# Patient Record
Sex: Female | Born: 1970
Health system: Southern US, Community
[De-identification: ages and names within clinical notes are randomized; demographics above are authoritative.]

## PROBLEM LIST (undated history)

## (undated) DIAGNOSIS — I2510 Atherosclerotic heart disease of native coronary artery without angina pectoris: Secondary | ICD-10-CM

## (undated) DIAGNOSIS — T7840XA Allergy, unspecified, initial encounter: Secondary | ICD-10-CM

## (undated) DIAGNOSIS — Z9889 Other specified postprocedural states: Secondary | ICD-10-CM

## (undated) DIAGNOSIS — Z8489 Family history of other specified conditions: Secondary | ICD-10-CM

## (undated) DIAGNOSIS — E785 Hyperlipidemia, unspecified: Secondary | ICD-10-CM

## (undated) DIAGNOSIS — D649 Anemia, unspecified: Secondary | ICD-10-CM

## (undated) DIAGNOSIS — N760 Acute vaginitis: Secondary | ICD-10-CM

## (undated) DIAGNOSIS — M1990 Unspecified osteoarthritis, unspecified site: Secondary | ICD-10-CM

## (undated) DIAGNOSIS — N939 Abnormal uterine and vaginal bleeding, unspecified: Secondary | ICD-10-CM

## (undated) DIAGNOSIS — I10 Essential (primary) hypertension: Secondary | ICD-10-CM

## (undated) DIAGNOSIS — F419 Anxiety disorder, unspecified: Secondary | ICD-10-CM

## (undated) DIAGNOSIS — Z973 Presence of spectacles and contact lenses: Secondary | ICD-10-CM

## (undated) DIAGNOSIS — A64 Unspecified sexually transmitted disease: Secondary | ICD-10-CM

## (undated) DIAGNOSIS — R112 Nausea with vomiting, unspecified: Secondary | ICD-10-CM

## (undated) DIAGNOSIS — B9689 Other specified bacterial agents as the cause of diseases classified elsewhere: Secondary | ICD-10-CM

## (undated) HISTORY — DX: Unspecified sexually transmitted disease: A64

## (undated) HISTORY — PX: ABDOMINAL HYSTERECTOMY: SHX81

## (undated) HISTORY — DX: Hyperlipidemia, unspecified: E78.5

## (undated) HISTORY — DX: Other specified bacterial agents as the cause of diseases classified elsewhere: B96.89

## (undated) HISTORY — DX: Anxiety disorder, unspecified: F41.9

## (undated) HISTORY — DX: Allergy, unspecified, initial encounter: T78.40XA

## (undated) HISTORY — DX: Abnormal uterine and vaginal bleeding, unspecified: N93.9

## (undated) HISTORY — PX: BREAST SURGERY: SHX581

## (undated) HISTORY — PX: JOINT REPLACEMENT: SHX530

## (undated) HISTORY — DX: Acute vaginitis: N76.0

## (undated) HISTORY — PX: BREAST CYST EXCISION: SHX579

## (undated) HISTORY — PX: FOOT SURGERY: SHX648

---

## 1988-09-09 DIAGNOSIS — A64 Unspecified sexually transmitted disease: Secondary | ICD-10-CM

## 1988-09-09 HISTORY — DX: Unspecified sexually transmitted disease: A64

## 1998-02-21 ENCOUNTER — Other Ambulatory Visit: Admission: RE | Admit: 1998-02-21 | Discharge: 1998-02-21 | Payer: Self-pay | Admitting: Obstetrics and Gynecology

## 1999-03-27 ENCOUNTER — Other Ambulatory Visit: Admission: RE | Admit: 1999-03-27 | Discharge: 1999-03-27 | Payer: Self-pay | Admitting: Obstetrics and Gynecology

## 2000-03-28 ENCOUNTER — Other Ambulatory Visit: Admission: RE | Admit: 2000-03-28 | Discharge: 2000-03-28 | Payer: Self-pay | Admitting: Obstetrics and Gynecology

## 2000-11-24 ENCOUNTER — Ambulatory Visit (HOSPITAL_COMMUNITY): Admission: RE | Admit: 2000-11-24 | Discharge: 2000-11-24 | Payer: Self-pay | Admitting: Neurosurgery

## 2000-11-24 ENCOUNTER — Encounter: Payer: Self-pay | Admitting: Neurosurgery

## 2001-01-09 ENCOUNTER — Encounter: Payer: Self-pay | Admitting: Internal Medicine

## 2001-01-09 ENCOUNTER — Ambulatory Visit (HOSPITAL_COMMUNITY): Admission: RE | Admit: 2001-01-09 | Discharge: 2001-01-09 | Payer: Self-pay | Admitting: Internal Medicine

## 2001-12-31 ENCOUNTER — Ambulatory Visit (HOSPITAL_COMMUNITY): Admission: RE | Admit: 2001-12-31 | Discharge: 2001-12-31 | Payer: Self-pay | Admitting: Family Medicine

## 2001-12-31 ENCOUNTER — Encounter: Payer: Self-pay | Admitting: Family Medicine

## 2002-02-23 ENCOUNTER — Ambulatory Visit (HOSPITAL_COMMUNITY): Admission: RE | Admit: 2002-02-23 | Discharge: 2002-02-23 | Payer: Self-pay | Admitting: Family Medicine

## 2002-02-23 ENCOUNTER — Encounter: Payer: Self-pay | Admitting: Family Medicine

## 2002-04-14 ENCOUNTER — Emergency Department (HOSPITAL_COMMUNITY): Admission: EM | Admit: 2002-04-14 | Discharge: 2002-04-14 | Payer: Self-pay | Admitting: Internal Medicine

## 2002-04-14 ENCOUNTER — Encounter: Payer: Self-pay | Admitting: Internal Medicine

## 2002-04-15 ENCOUNTER — Emergency Department (HOSPITAL_COMMUNITY): Admission: EM | Admit: 2002-04-15 | Discharge: 2002-04-15 | Payer: Self-pay | Admitting: Emergency Medicine

## 2003-05-31 ENCOUNTER — Encounter: Payer: Self-pay | Admitting: Emergency Medicine

## 2003-05-31 ENCOUNTER — Emergency Department (HOSPITAL_COMMUNITY): Admission: EM | Admit: 2003-05-31 | Discharge: 2003-05-31 | Payer: Self-pay | Admitting: Emergency Medicine

## 2003-09-10 HISTORY — PX: FRACTURE SURGERY: SHX138

## 2003-09-12 ENCOUNTER — Other Ambulatory Visit: Admission: RE | Admit: 2003-09-12 | Discharge: 2003-09-12 | Payer: Self-pay | Admitting: Dermatology

## 2004-01-24 ENCOUNTER — Encounter: Admission: RE | Admit: 2004-01-24 | Discharge: 2004-01-24 | Payer: Self-pay | Admitting: Sports Medicine

## 2004-02-14 ENCOUNTER — Ambulatory Visit (HOSPITAL_BASED_OUTPATIENT_CLINIC_OR_DEPARTMENT_OTHER): Admission: RE | Admit: 2004-02-14 | Discharge: 2004-02-14 | Payer: Self-pay | Admitting: Orthopedic Surgery

## 2004-03-19 ENCOUNTER — Encounter (HOSPITAL_COMMUNITY): Admission: RE | Admit: 2004-03-19 | Discharge: 2004-04-18 | Payer: Self-pay | Admitting: Orthopedic Surgery

## 2004-04-04 ENCOUNTER — Ambulatory Visit (HOSPITAL_COMMUNITY): Admission: RE | Admit: 2004-04-04 | Discharge: 2004-04-04 | Payer: Self-pay | Admitting: *Deleted

## 2005-02-15 ENCOUNTER — Ambulatory Visit (HOSPITAL_COMMUNITY): Admission: RE | Admit: 2005-02-15 | Discharge: 2005-02-15 | Payer: Self-pay | Admitting: Preventative Medicine

## 2006-10-21 ENCOUNTER — Ambulatory Visit: Payer: Self-pay | Admitting: Gastroenterology

## 2006-11-12 ENCOUNTER — Ambulatory Visit (HOSPITAL_COMMUNITY): Admission: RE | Admit: 2006-11-12 | Discharge: 2006-11-12 | Payer: Self-pay | Admitting: Gastroenterology

## 2006-11-12 ENCOUNTER — Encounter (INDEPENDENT_AMBULATORY_CARE_PROVIDER_SITE_OTHER): Payer: Self-pay | Admitting: Specialist

## 2006-11-12 ENCOUNTER — Ambulatory Visit: Payer: Self-pay | Admitting: Gastroenterology

## 2006-11-24 ENCOUNTER — Ambulatory Visit (HOSPITAL_COMMUNITY): Admission: RE | Admit: 2006-11-24 | Discharge: 2006-11-24 | Payer: Self-pay | Admitting: Gastroenterology

## 2006-11-24 ENCOUNTER — Encounter (INDEPENDENT_AMBULATORY_CARE_PROVIDER_SITE_OTHER): Payer: Self-pay | Admitting: *Deleted

## 2006-11-24 ENCOUNTER — Ambulatory Visit: Payer: Self-pay | Admitting: Gastroenterology

## 2007-12-21 ENCOUNTER — Ambulatory Visit (HOSPITAL_COMMUNITY): Admission: RE | Admit: 2007-12-21 | Discharge: 2007-12-21 | Payer: Self-pay | Admitting: Unknown Physician Specialty

## 2008-09-09 HISTORY — PX: ENDOMETRIAL ABLATION: SHX621

## 2008-12-02 ENCOUNTER — Ambulatory Visit (HOSPITAL_COMMUNITY): Admission: RE | Admit: 2008-12-02 | Discharge: 2008-12-02 | Payer: Self-pay | Admitting: Pediatrics

## 2009-06-12 ENCOUNTER — Encounter (HOSPITAL_COMMUNITY): Admission: RE | Admit: 2009-06-12 | Discharge: 2009-07-12 | Payer: Self-pay | Admitting: Pediatrics

## 2009-06-12 ENCOUNTER — Ambulatory Visit (HOSPITAL_COMMUNITY): Payer: Self-pay | Admitting: Pediatrics

## 2009-07-07 ENCOUNTER — Other Ambulatory Visit: Admission: RE | Admit: 2009-07-07 | Discharge: 2009-07-07 | Payer: Self-pay | Admitting: Obstetrics & Gynecology

## 2009-07-12 ENCOUNTER — Ambulatory Visit (HOSPITAL_COMMUNITY): Admission: RE | Admit: 2009-07-12 | Discharge: 2009-07-12 | Payer: Self-pay | Admitting: Obstetrics & Gynecology

## 2009-10-28 ENCOUNTER — Encounter: Admission: RE | Admit: 2009-10-28 | Discharge: 2009-10-28 | Payer: Self-pay | Admitting: Neurosurgery

## 2010-08-07 ENCOUNTER — Emergency Department (HOSPITAL_COMMUNITY)
Admission: EM | Admit: 2010-08-07 | Discharge: 2010-08-07 | Payer: Self-pay | Source: Home / Self Care | Admitting: Emergency Medicine

## 2010-08-20 ENCOUNTER — Emergency Department (HOSPITAL_COMMUNITY)
Admission: EM | Admit: 2010-08-20 | Discharge: 2010-08-20 | Payer: Self-pay | Source: Home / Self Care | Admitting: Emergency Medicine

## 2010-10-01 ENCOUNTER — Encounter: Payer: Self-pay | Admitting: Chiropractic Medicine

## 2010-12-12 LAB — COMPREHENSIVE METABOLIC PANEL
ALT: 17 U/L (ref 0–35)
AST: 17 U/L (ref 0–37)
Albumin: 4.3 g/dL (ref 3.5–5.2)
BUN: 10 mg/dL (ref 6–23)
CO2: 26 mEq/L (ref 19–32)
GFR calc Af Amer: >60 mL/min (ref >60)
GFR calc non Af Amer: >60 mL/min (ref >60)
Glucose, Bld: 104 mg/dL — ABNORMAL HIGH (ref 70–99)
Potassium: 3.5 mEq/L (ref 3.5–5.1)
Sodium: 140 mEq/L (ref 135–145)
Total Protein: 7 g/dL (ref 6.0–8.3)

## 2010-12-12 LAB — URINALYSIS, DIPSTICK ONLY
Bilirubin Urine: NEGATIVE
Glucose, UA: NEGATIVE mg/dL
Hgb urine dipstick: NEGATIVE
Specific Gravity, Urine: <1.005 — ABNORMAL LOW (ref 1.005–1.030)
pH: 6 (ref 5.0–8.0)

## 2010-12-12 LAB — CBC
MCV: 75.3 fL — ABNORMAL LOW (ref 78.0–100.0)
Platelets: 207 10*3/uL (ref 150–400)
RBC: 4.62 MIL/uL (ref 3.87–5.11)

## 2010-12-12 LAB — HCG, QUANTITATIVE, PREGNANCY: hCG, Beta Chain, Quant, S: <2 m[IU]/mL (ref <5)

## 2011-01-25 NOTE — Consult Note (Signed)
Doris Davis, Doris Davis                 ACCOUNT NO.:  [id number]   MEDICAL RECORD NO.:  [id number]           PATIENT TYPE:  AMB   LOCATION:                                FACILITY:  [facility]   PHYSICIAN:  Kassie Mends, M.D.      DATE OF BIRTH:  [birth year]   DATE OF CONSULTATION:  10/21/2006  DATE OF DISCHARGE:                                 CONSULTATION   REASON FOR CONSULTATION:  Abdominal pain.   HISTORY OF PRESENT ILLNESS:  Mrs. Doris Davis is a 40 year old female who is  admitted having abdominal pain, intermittent diarrhea and rectal  bleeding since Thanksgiving.  She says her symptoms have been getting  progressively worse.  She has been having to watch what she has been  eating.  She reports having a sensitive stomach for many years, but  things have been worse than usual.  She usually is only bothered by fat.  She can eat Slim Fast, lean meat, cheese and crab legs.  She has lost  approximately 20 pounds.  She reports having a subjective fever off and  on.  Today, she actually has a temperature of 101.2.  She has had blood  work performed but no stool studies.  Dr. Milinda Cave ordered stool studies,  but she did not want to have to repeat them, so she did not submit a  sample.  She did travel to [place] in 2006 and ate the food and drank the  water.  She has no sick contacts.  For 1-2 days during the trip to  [place], she did have an upset stomach, but she thought that was because  the food was spicy.  She did also take a trip to the [place] on a cruise  ship in 2004.  She denies any antibiotic use.  She has been exposed to  well water.  She is nauseated every day.  She has a vague sense of  nausea every day.  This has become more noticeable since Thanksgiving.  She vomits once a month.  No blood has been in the vomit.  She dry  heaves less than once a week.  Her morning bowel movement is formed, but  then her bowel movements become watery throughout the day.   During Thanksgiving, she was having  three bowel movements a day, now she  may have up to 10 bowel movements a day.  She eats and then she has to  go to the bathroom.  Now when she eats she has to run to the bathroom.  She complains of pain in her rectum as well.  She only has pain in her  rectum with bowel movements.  She denies any sores in her mouth or rash  on her shins.  She denies any joint pain.  She complains of feet pain  related to fracture in both of her ankles at the same time.  She  complains of pain with swallowing.  She has seen blood from her rectum,  sometimes like period.  The last time she saw any bleeding from her  rectum was two  weeks ago.  She denies any black, tarry stools, heartburn  or indigestion.  She has visited her mother-in-law who is in a nursing  home.  She complains of left lower quadrant pain that is sharp and achy.  It varies in intensity.  It can be so severe that it knocks her to her  knees.  It happens at least once a week.  It is off and on.  When it is  sharp, it can last 2-3 minutes.  The achiness can be more prolonged and  last up to 1-2 hours.  It usually occurs after eating.  Her pain is  associated with bloating.  The pain is usually worse after eating and  after fried foods.  It is better if she does not eat.  She did try Aleve  which did not help.  It does not radiate.  The only time she has seen  blood in her stool it is associated with the diarrhea.  She denies any  use of aspirin, BC's, Goody's or Motrin.  She stopped using Aleve during  November.  Her last menstrual period was October 06, 2006.  She  describes the pain as worse, because it is more often more frequent and  more severe.   PAST MEDICAL HISTORY:  1. Hypertension.  2. Ruptured disk.  3. Perimenstrual cramping.   PAST SURGICAL HISTORY:  1. Left ankle reconstruction.  2. C-section.   ALLERGIES:  PENICILLIN CAUSES HER TO STOP BREATHING, ERYTHROMYCIN CAUSES  HIVES, SULFA CAUSES HIVES AND CAUSES HER TO STOP  BREATHING, CPAP CAUSES  HER TO BREAK OUT.  ORAL IRON PREPARATIONS CAUSE BLOOD IN STOOL AND  DIARRHEA.   MEDICATIONS:  1. Enalapril 10 mg daily.  2. Hyoscyamine 0.15 mg sublingual as needed.  She states the      hyoscyamine causes her to feel loopy, bloated, gassy and      constipated.  3. She denies any other over-the-counter preparations.   FAMILY HISTORY:  Her father and mother have polyps.  She denies any  family history of colon cancer.  She denies any family history of  ulcerative colitis, Crohn's disease or celiac sprue.   SOCIAL HISTORY:  She is married, and her husband is in his 15's.  She  has one child who is 20 years old.  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  She quit smoking from June 2005 to September 2006.  She  drinks 3-4 Michelob Lights a week.   REVIEW OF SYSTEMS:  Per the HPI, otherwise, all systems are negative.   OBJECTIVE:  VITAL SIGNS:  Weight 199 pounds, height 5 feet 4 inches, BMI  34.2 (obese), temperature 101.2, blood pressure 138/80, pulse 88.  GENERAL:  She is in no apparent distress, alert and oriented x4.  HEENT:  Normocephalic, atraumatic.  Pupils equal, round and reactive to light.  Mouth:  No oral lesions.  Posterior pharynx without erythema or exudate.  NECK:  Full range of motion.  No lymphadenopathy. LUNGS:  Clear to  auscultation bilaterally. CARDIOVASCULAR:  Regular rate and rhythm.  No  murmur.  Normal S1, S2.  ABDOMEN:  Bowel sounds are present, soft,  nondistended, no hepatosplenomegaly.  No abdominal bruits.  Obese.  No  pulsatile masses.  Mild tenderness to palpation, to moderate tenderness  to palpation in the left lower quadrant without rebound or guarding.  EXTREMITIES:  Without cyanosis, clubbing or edema.  RECTAL:  Heme  negative, formed stool in the rectal vault without masses.  Small  external hemorrhoids. NEUROLOGICAL:  She has no focal neurological  deficits.  LABORATORY DATA:  From August 2007, white count 7.3,  hemoglobin 10.3,  MCV 76.4, platelets 302, BUN 14, creatinine 0.66, AST 23, ALT 44,  albumin 4.4, TSH 1.367.   ASSESSMENT:  Mrs. Doris Davis is a 40 year old female with abdominal pain,  intermittent formed stool, diarrhea and rectal bleeding and nausea.  She  has a history of travel to [place].  She likely has a functional gut  disorder and hemorrhoids.  The differential diagnosis includes  infectious colitis and irritable bowel disease.  She could also have  celiac sprue, as well, with hemorrhoids.   Thank you for allowing me to see Mrs. Doris Davis in consultation.  My  recommendations follow.   RECOMMENDATIONS:  1. I recommend obtaining Giardia antigen, stool ova and parasites x3      due to her travel to [place].  I will also obtain a C-diff toxin x1      and fecal WBC's.  She is asked to give me a liquid stool sample,      not a formed stool sample.  2. I will schedule a colonoscopy and EGD next Wednesday.  I will      biopsy any abnormal mucosal areas, look into the terminal ileum and      also perform an upper endoscopy and biopsy or duodenum.  She will      have the Moviprep.  3. She has a follow up appointment to see me in six weeks.  4. She should use Tylenol as needed for fever.  I will make additional      recommendations in regards to the management of her abdominal pain,      rectal bleeding and diarrhea after the endoscopy is complete.      Kassie Mends, M.D.  Electronically Signed     SM/MEDQ  D:  10/21/2006  T:  10/22/2006  Job:  [id number]   cc:   Jeoffrey Massed, MD  Fax: [phone]

## 2011-01-25 NOTE — Op Note (Signed)
NAMECANDRA, Doris Davis                 ACCOUNT NO.:  [id number]   MEDICAL RECORD NO.:  [id number]          PATIENT TYPE:  AMB   LOCATION:  DAY                           FACILITY:  [facility]   PHYSICIAN:  Kassie Mends, M.D.      DATE OF BIRTH:  08/19/1971   DATE OF PROCEDURE:  11/24/2006  DATE OF DISCHARGE:                               OPERATIVE REPORT   REFERRING PHYSICIAN:  Jeoffrey Massed, MD.   PROCEDURE:  Esophagogastroduodenoscopy with cold forceps biopsies  performed with propofol because patient failed attempt at EGD with  conscious sedation on 11/12/2006 because she was extremely agitated and  was pulling the scope out of her mouth, needed to be physically  restrained during the procedure, and requested the procedure be stopped.   INDICATION FOR EXAM:  Ms. Doris Davis is a 40 year old female with persistent  abdominal pain, intermittent vomiting, and weight loss.   FINDINGS:  1. Normal esophagus without evidence of Barrett's, erosions, erythema,      or ulceration.  2. Normal stomach without evidence of ulcerations or erosions.      Biopsies obtained to evaluate for eosinophilic gastritis.  3. Normal duodenal bulb and second portion of the duodenum.  Biopsies      obtained to evaluate for celiac sprue.   RECOMMENDATIONS:  1. Ms. Doris Davis states that most foods cause her to have increased      abdominal pain.  She continues to drink Slim-Fast.  She is only      consuming two Slim-Fasts per day.  I told her she needed to consume      at least four a day.  I encouraged her to attempt to consume a soft      diet.  She is given a handout on a soft diet.  2. Will call [name] with her biopsy results.  3. Schedule CT enterography as soon as possible to evaluate her      abdominal pain and vomiting.  4. No aspirin or anti-inflammatory drugs for 3 days.   MEDICATION:  Provided by anesthesia.   PROCEDURE TECHNIQUE:  Physical exam was performed and informed consent  was obtained from the  patient after explaining the benefits, risks and  alternatives to the procedure.  The patient was connected to the monitor  and placed in the left lateral position.  Continuous oxygen was provided  by nasal and IV medicine administered through an indwelling cannula.  After administration of sedation by anesthesia, the patient's esophagus  was intubated and the  scope was passed under direct visualization to second portion of  duodenum.  The scope was subsequently slowly by carefully examining the  color, texture, anatomy and integrity of the mucosa on the way out.  The  patient was recovered in the endoscopy suite and discharged to home in  satisfactory condition.      Kassie Mends, M.D.  Electronically Signed     SM/MEDQ  D:  11/24/2006  T:  11/24/2006  Job:  [id number]

## 2011-01-25 NOTE — Op Note (Signed)
Doris Davis, NODAL                        ACCOUNT NO.:  [id number]   MEDICAL RECORD NO.:  [id number]                   PATIENT TYPE:  AMB   LOCATION:  DSC                                  FACILITY:  [facility]   PHYSICIAN:  Leonides Grills, M.D.                  DATE OF BIRTH:  1971/03/28   DATE OF PROCEDURE:  02/14/2004  DATE OF DISCHARGE:                                 OPERATIVE REPORT   PREOPERATIVE DIAGNOSES:  1. Left peroneus brevis tear.  2. Subluxing left peroneal tendons.  3. Tenosynovitis, left peroneal tendons.   POSTOPERATIVE DIAGNOSES:  1. Left peroneus brevis tear.  2. Subluxing left peroneal tendons.  3. Tenosynovitis, left peroneal tendons.   OPERATION:  1. Left peroneal tenosynovectomy.  2. Left repair of subluxing peroneal tendons without fibular osteotomy.  3. Repair of left peroneus brevis tear.   ANESTHESIA:  General endotracheal tube with popliteal block.   SURGEON:  Leonides Grills, M.D.   ASSISTANT:  Lianne Cure, P.A.   ESTIMATED BLOOD LOSS:  Minimal.   TOURNIQUET TIME:  Approximately 1 hour.   COMPLICATIONS:  None.   DISPOSITION:  Stable to PAR.   INDICATIONS:  This is a 40 year old female who has long-standing left  posterolateral ankle pain that was interfering with her life to the point  where she could not do what she wants to do.  She was consented for the  above procedure.  All risks, which include infection, nerve or vessel  injury, persistent pain, worsening pain, stiffness, and weakness, were all  explained.  Questions were encouraged and answered.   OPERATION:  The patient was brought to the operating room and placed in the  supine position initially after adequate general endotracheal tube  anesthesia was administered with popliteal block as well as vancomycin 500  mg IV piggyback.  The patient was then placed in a sloppy lateral position  with the operative side up and the left lower extremity was then prepped and  draped in a  sterile manner over a proximally-placed thigh tourniquet.  The  limb was gravity-exsanguinated and the tourniquet was elevated to 290 mmHg.  A curvilinear incision was made over the posterior lateral aspect of the  left ankle.  Dissection was carried down through skin.  Hemostasis was  obtained.  The retinaculum over the peroneal tendons was then opened  approximately 2 mm from the posterolateral edge of the fibula.  Once this  was done, the tendons were identified.  There was a tremendous amount of  synovitis in this area.  This was completely debulked.  Also the peroneus  brevis and longus muscle bellies were distalized all the way to the tip of  the lateral malleolus.  This was also debulked proximately about 4-5 cm.  Once this was done, we inspected the tendon.  The peroneus longus was in  pristine condition.  The peroneus brevis, however, had a large tear  down the  center of the tendon measuring approximately 3-3.5 cm in length.  It was  less than 50% involvement of the tendon, so we decided to try to preserve  the tendon and repair the tear.  This was repaired with 5-0 nylon suture in  a cannulated stitch manner.  Once this was done and sewn on either side of  the tear, the area was copiously irrigated with normal saline.  We then  created a bed for the advancement of the peroneal retinaculum.  Once this  was prepared with a curved quarter-inch osteotome and a rongeur, we then  placed one 5.0 absorbable suture anchor in this area with #2 Fibrewire.  We  then advanced the retinaculum to the anchor and sewed this down.  We then  advanced the remaining portion of the retinaculum and repaired it to the  prepared bed in the posterolateral aspect of the fibula with #2 Fibrewire.  Once that was repaired, the repair was excellent and strong.  The tendons  were free distally as well in its respective peroneal sheath and tenolysed,  so there was no impinging area.  The ankle was ranged with  ankle range of  motion as well as talar motion, and the tendons were solid.  There were no  impinging areas as well.  The remaining portion of the retinaculum was  repaired with 2-0 Fibrewire, protecting the peroneal tendons with a Freer.  Again the ankle was ranged.  There were no abnormalities.  The wound was  copiously irrigated with normal saline.  The subcu was closed with 3-0  Vicryl, the skin was closed with 4-0 nylon.  Prior to wound closure the  tourniquet was deflated, hemostasis was obtained.  After the wound closure a  sterile dressing was applied, a Jones dressing was applied.  The patient was  stable to the PAR.                                               Leonides Grills, M.D.    PB/MEDQ  D:  02/14/2004  T:  02/15/2004  Job:  [id number]

## 2011-01-25 NOTE — Op Note (Signed)
Doris Davis, Doris Davis                 ACCOUNT NO.:  [id number]   MEDICAL RECORD NO.:  [id number]          PATIENT TYPE:  AMB   LOCATION:  DAY                           FACILITY:  [facility]   PHYSICIAN:  Kassie Mends, M.D.      DATE OF BIRTH:  Jan 12, 1971   DATE OF PROCEDURE:  11/12/2006  DATE OF DISCHARGE:                               OPERATIVE REPORT   REFERRING PHYSICIANS:  Jeoffrey Massed, M.D.   PROCEDURE:  1. Colonoscopy with cold-forceps biopsy.  2. Limited esophagogastroduodenoscopy secondary to patient agitation.   INDICATION FOR EXAMINATION:  Doris Davis is a 40 year old female who was  seen as an outpatient in February2008.  She was complaining of abdominal  pain, intermittent diarrhea and rectal bleeding since November2007.  Since she was seen, her last episode of rectal bleeding was 6 weeks ago.  She still complains of intermittent fevers.  She has vomiting 4 to 5  times a week.  She reports a 13-pound weight loss.  She is having 4 to 5  formed soft stools a day.  She still has the urge to have a bowel  movement and sometimes rectal urgency.  She has left lower quadrant  abdominal pain.  She still is on a Slim-Fast diet as well as low-fat  foods.  Her stool studies revealed no etiology for infectious diarrhea.   FINDINGS:  1. Normal terminal ileum.  Approximately 10 cm of the terminal ileum      visualized.  2. Rare sigmoid diverticulosis.  Biopsies obtained to evaluate for      microscopic colitis.  Otherwise normal colon without evidence of      polyps, masses, inflammatory changes or AVMs.  3. Normal retroflexed view of the rectum.  Doris Davis had significant      abdominal discomfort when the scope was passed through the sigmoid      colon.  4. The upper endoscopy had to be discontinued because Doris Davis was      extremely agitated when the scope was passed through her upper      esophagus.  She was grabbing at the scope and requested that the      scope be withdrawn.   After the scope was removed, she was      adequately sedated.  The visualization of the esophagus and the      gastric mucosa was limited.  The scope was not able to be passed      into the duodenal bulb or the second portion of the duodenum.   RECOMMENDATIONS:  1. High-fiber diet.  Doris Davis was given a handout on high-fiber diet      and diverticulosis.  2. Will call [name] with the results of her biopsies. If her upper      endoscopy is negative, then CT enterography in [place] to      complete evaluation of her GI tract and to evalaute the palpable      nodule in the anterior portion of her rectum 3.  No aspirin, NSAIDs      or anticoagulation for  7 days.  3. Will schedule upper endoscopy with propofol to evaluate the upper      GI tract in three weeks.  4. She already has a follow-up appointment to see me within the next 4      weeks.   MEDICATIONS:  1. Colonoscopy - Demerol 100 mg IV, Versed 7 mg IV, Phenergan 50 mg      IV.  2. Esophagogastroduodenoscopy - Versed 3 mg IV.   PROCEDURE TECHNIQUE:  Physical exam was performed.  Informed consent was  obtained from the patient after explaining the benefits, risks and  alternatives to the procedure.  The patient was connected to the monitor  and placed in the left lateral position.  Continuous oxygen was provided  by nasal cannula and IV medicine administered through an indwelling  cannula.  After administration of sedation and rectal exam, the  patient's rectum was intubated, and the scope was advanced under direct  visualization to the cecum.  The scope was subsequently removed slowly  by carefully examining color, texture, anatomy and integrity of mucosa  on the way out.   After the colonoscopy, the patient's esophagus was attempted to be  intubated with a diagnostic gastroscope.  The patient was extremely  agitated.  The patient was given additional sedation and the diagnostic  scope was passed into the stomach the  patient again became agitated and  requested the procedure be stopped. The scope was subsequently  withdrawn. After the scope was removed, she remained adequately sedated.  The patient was recovered in the endoscopy suite and discharged to home  in satisfactory condition      Kassie Mends, M.D.  Electronically Signed     SM/MEDQ  D:  11/12/2006  T:  11/12/2006  Job:  [id number]   cc:   Jeoffrey Massed, MD  Fax: [phone]   Kassie Mends, M.D.  [address]  [place] , [place] [address]

## 2011-05-07 ENCOUNTER — Other Ambulatory Visit: Payer: Self-pay | Admitting: Obstetrics & Gynecology

## 2011-05-07 ENCOUNTER — Other Ambulatory Visit (HOSPITAL_COMMUNITY)
Admission: RE | Admit: 2011-05-07 | Discharge: 2011-05-07 | Disposition: A | Discharge status: Home / Self Care | Payer: 59 | Source: Ambulatory Visit | Attending: Obstetrics & Gynecology | Admitting: Obstetrics & Gynecology

## 2011-05-07 DIAGNOSIS — Z01419 Encounter for gynecological examination (general) (routine) without abnormal findings: Secondary | ICD-10-CM | POA: Insufficient documentation

## 2011-07-12 ENCOUNTER — Other Ambulatory Visit: Payer: Self-pay | Admitting: Obstetrics & Gynecology

## 2011-07-12 ENCOUNTER — Encounter (HOSPITAL_COMMUNITY)
Admission: RE | Admit: 2011-07-12 | Discharge: 2011-07-12 | Disposition: A | Discharge status: Home / Self Care | Payer: 59 | Source: Ambulatory Visit | Attending: Obstetrics & Gynecology | Admitting: Obstetrics & Gynecology

## 2011-07-12 ENCOUNTER — Encounter (HOSPITAL_COMMUNITY): Payer: Self-pay | Admitting: Pharmacy Technician

## 2011-07-12 ENCOUNTER — Other Ambulatory Visit: Payer: Self-pay

## 2011-07-12 ENCOUNTER — Encounter (HOSPITAL_COMMUNITY): Payer: Self-pay

## 2011-07-12 HISTORY — DX: Nausea with vomiting, unspecified: R11.2

## 2011-07-12 HISTORY — DX: Nausea with vomiting, unspecified: Z98.890

## 2011-07-12 HISTORY — DX: Essential (primary) hypertension: I10

## 2011-07-12 LAB — URINALYSIS, ROUTINE W REFLEX MICROSCOPIC
Bilirubin Urine: NEGATIVE
Hgb urine dipstick: NEGATIVE
Nitrite: NEGATIVE
Protein, ur: NEGATIVE mg/dL
Specific Gravity, Urine: 1.01 (ref 1.005–1.030)
Urobilinogen, UA: 0.2 mg/dL (ref 0.0–1.0)

## 2011-07-12 LAB — CBC
MCH: 29.4 pg (ref 26.0–34.0)
MCHC: 33.7 g/dL (ref 30.0–36.0)
MCV: 87.4 fL (ref 78.0–100.0)
Platelets: 226 10*3/uL (ref 150–400)

## 2011-07-12 LAB — COMPREHENSIVE METABOLIC PANEL
ALT: 19 U/L (ref 0–35)
AST: 14 U/L (ref 0–37)
CO2: 28 mEq/L (ref 19–32)
Calcium: 10.1 mg/dL (ref 8.4–10.5)
Creatinine, Ser: 0.62 mg/dL (ref 0.50–1.10)
GFR calc Af Amer: >90 mL/min (ref >90)
GFR calc non Af Amer: >90 mL/min (ref >90)
Sodium: 136 mEq/L (ref 135–145)
Total Protein: 7 g/dL (ref 6.0–8.3)

## 2011-07-12 NOTE — Patient Instructions (Addendum)
18 Doris Davis  07/12/2011   Your procedure is scheduled on:  07/17/2011  Report to [facility] at 830  AM.  Call this number if you have problems the morning of surgery: [phone]   Remember:   Do not eat food:After Midnight.  Do not drink clear liquids: After Midnight.  Take these medicines the morning of surgery with A SIP OF WATER:vasotec   Do not wear jewelry, make-up or nail polish.  Do not wear lotions, powders, or perfumes. You may wear deodorant.  Do not shave 48 hours prior to surgery.  Do not bring valuables to the hospital.  Contacts, dentures or bridgework may not be worn into surgery.  Leave suitcase in the car. After surgery it may be brought to your room.  For patients admitted to the hospital, checkout time is 11:00 AM the day of discharge.   Patients discharged the day of surgery will not be allowed to drive home.  Name and phone number of your driver: none  Special Instructions: CHG Shower Use Special Wash: 1/2 bottle night before surgery and 1/2 bottle morning of surgery.   Please read over the following fact sheets that you were given: Pain Booklet, MRSA Information, Surgical Site Infection Prevention, Anesthesia Post-op Instructions and Care and Recovery After Surgery Supracervical Hysterectomy The uterus is the female organ that produces menstrual periods and carries a baby. The body of the uterus is the top part that is in the pelvis. The cervix (the opening or neck) is the bottom part that protrudes into the top of the vagina. A supracervical hysterectomy is the removal of the body of the uterus but not the cervix. This procedure can be performed with a large abdominal incision. It can also be done with a long tube with a light (laparoscopy) inserted into two small incisions in the lower abdomen. The tubes and ovaries can be removed during this operation if necessary. You will not have menstrual periods or be able to get pregnant after having this procedure.  Women  who are going to have this procedure should be tested to make sure they have a normal Pap test and no signs of precancer or cancer disease of the cervix or uterus. You should also know about the possibility of problems developing on the cervix later, which may need more surgery to remove the cervix.  Reasons this procedure is done:  Endometriosis. This is when the lining of the inside of the uterus is misplaced outside of its normal location.   Adenomyosis. This is when endometrial tissue goes in the muscle of the uterus.   Persistent abnormal bleeding.   Lasting (chronic) pelvic pain.   Uterine prolapse. This is when the uterus falls down into the vagina.  LET YOUR CAREGIVER KNOW ABOUT:  Allergies especially to any medications.   All the medications you are taking including over-the-counter medication, herbs, eye drops and creams.   Past problems with anesthesia or novocaine.   Possibility of being pregnant.   All past surgeries.   History of blood clots or bleeding problems.   Other medical problems (diabetes, kidney, heart or lung problems).  RISKS AND COMPLICATIONS  Blood clots of the leg, heart or lung.   Infection.   Severe bleeding.   Injury to other surrounding organs.   Early menopause.   Problems with the anesthesia.   More surgery later to remove the cervix if you have problems with the cervix.  BEFORE THE PROCEDURE  Do not take aspirin or  blood thinners a week before the surgery or as told by your caregiver.   Do not eat or drink anything the night before the surgery or as told by your caregiver.   Let your caregiver know if you develop a cold or any other infection.   If you are admitted the day of the surgery, show up 60 minutes before the surgery or as directed by your caregiver.  PROCEDURE  An intravenous (IV) will be placed in your arm. You will be given an anesthetic to make you sleep during the surgery. You may be given a spinal anesthesia to  numb your body from the waist down. When you wake up, you will still have the IV and also have a Foley catheter in you. A Foley catheter will drain your bladder for a day or two.  AFTER THE PROCEDURE  You will be taken to the recovery room for a couple of hours until it is safe to take you to your hospital room. Usually, you will remain in the hospital for 3 to 5 days. You may be given a medicine that kills germs (antibiotic) during and after the surgery. Pain medication will be ordered for you by your caregiver while you are in the hospital and for when you go home. HOME CARE INSTRUCTIONS  Healing will take time. You will have discomfort, tenderness, swelling and bruising at the operative site for a couple of weeks. This is normal and will get better as time goes on.   Only take over-the-counter or prescription medicines for pain, discomfort or fever as directed by your caregiver.   Do not take aspirin. It can cause bleeding.   Do not drive when taking pain medication.   Follow your caregiver's advice regarding diet, exercise, lifting, driving and general activities.   Resume your usual diet as directed and allowed.   Get plenty of rest and sleep.   Do not douche, use tampons, or have sexual intercourse until your caregiver gives you permission.   Change your bandages (dressings) as directed.   Take your temperature twice a day. Write it down.   Your caregiver may recommend showers instead of baths for a few weeks.   Do not drink alcohol until your caregiver gives you permission.   If you develop constipation, you may take a mild laxative with your caregiver's permission. Bran foods and drinking fluids helps with constipation problems.   Try to have someone home with you for a week or two to help with the household activities.   Make sure you and your family understands everything about your operation and recovery.   Do not sign any legal documents until you feel normal again.    Keep all your follow-up appointments as recommended by your caregiver.  SEEK MEDICAL CARE IF:   There is swelling, redness or increasing pain in the wound area.   Pus is coming from the wound.   You notice a bad smell from the wound or surgical dressing.   You have pain, redness and swelling from the intravenous site.   The wound is breaking open (the edges are not staying together).   You feel dizzy or feel like fainting.   You develop pain or bleeding when you urinate.   You develop diarrhea.   You develop nausea and vomiting.   You develop abnormal vaginal discharge.   You develop a rash.   You have any type of abnormal reaction or develop an allergy to your medication.   You  need stronger pain medication for your pain.  SEEK IMMEDIATE MEDICAL CARE IF:  You have an oral temperature above 102 F (38.9 C), not controlled by medicine.   You develop abdominal pain.   You develop chest pain.   You develop shortness of breath.   You pass out.   You develop pain, swelling or redness of your leg.   You develop heavy vaginal bleeding with or without blood clots.  Document Released: 02/12/2008 Document Revised: 12/21/2010 Document Reviewed: 01/12/2008 [facility] Patient Information 2012 [place], [place].PATIENT INSTRUCTIONS POST-ANESTHESIA  IMMEDIATELY FOLLOWING SURGERY:  Do not drive or operate machinery for the first twenty four hours after surgery.  Do not make any important decisions for twenty four hours after surgery or while taking narcotic pain medications or sedatives.  If you develop intractable nausea and vomiting or a severe headache please notify your doctor immediately.  FOLLOW-UP:  Please make an appointment with your surgeon as instructed. You do not need to follow up with anesthesia unless specifically instructed to do so.  WOUND CARE INSTRUCTIONS (if applicable):  Keep a dry clean dressing on the anesthesia/puncture wound site if there is drainage.   Once the wound has quit draining you may leave it open to air.  Generally you should leave the bandage intact for twenty four hours unless there is drainage.  If the epidural site drains for more than 36-48 hours please call the anesthesia department.  QUESTIONS?:  Please feel free to call your physician or the hospital operator if you have any questions, and they will be happy to assist you.     [facility] Anesthesia Department [address] [place] [phone]

## 2011-07-15 LAB — TYPE AND SCREEN
ABO/RH(D): O NEG
Antibody Screen: NEGATIVE

## 2011-07-17 ENCOUNTER — Encounter (HOSPITAL_COMMUNITY)
Admission: RE | Disposition: A | Discharge status: Home / Self Care | Payer: Self-pay | Source: Ambulatory Visit | Attending: Obstetrics & Gynecology

## 2011-07-17 ENCOUNTER — Encounter (HOSPITAL_COMMUNITY): Payer: Self-pay | Admitting: Anesthesiology

## 2011-07-17 ENCOUNTER — Other Ambulatory Visit: Payer: Self-pay | Admitting: Obstetrics & Gynecology

## 2011-07-17 ENCOUNTER — Encounter (HOSPITAL_COMMUNITY): Payer: Self-pay | Admitting: *Deleted

## 2011-07-17 ENCOUNTER — Ambulatory Visit (HOSPITAL_COMMUNITY)
Admission: RE | Admit: 2011-07-17 | Discharge: 2011-07-17 | Disposition: A | Discharge status: Home / Self Care | Payer: 59 | Source: Ambulatory Visit | Attending: Obstetrics & Gynecology | Admitting: Obstetrics & Gynecology

## 2011-07-17 DIAGNOSIS — Z90710 Acquired absence of both cervix and uterus: Secondary | ICD-10-CM

## 2011-07-17 DIAGNOSIS — Z79899 Other long term (current) drug therapy: Secondary | ICD-10-CM | POA: Insufficient documentation

## 2011-07-17 DIAGNOSIS — Z01812 Encounter for preprocedural laboratory examination: Secondary | ICD-10-CM | POA: Insufficient documentation

## 2011-07-17 DIAGNOSIS — N946 Dysmenorrhea, unspecified: Secondary | ICD-10-CM | POA: Insufficient documentation

## 2011-07-17 DIAGNOSIS — I10 Essential (primary) hypertension: Secondary | ICD-10-CM | POA: Insufficient documentation

## 2011-07-17 DIAGNOSIS — N920 Excessive and frequent menstruation with regular cycle: Secondary | ICD-10-CM | POA: Insufficient documentation

## 2011-07-17 HISTORY — PX: LAPAROSCOPIC SUPRACERVICAL HYSTERECTOMY: SHX5399

## 2011-07-17 SURGERY — HYSTERECTOMY, SUPRACERVICAL, LAPAROSCOPIC
Anesthesia: General | Site: Abdomen | Wound class: Clean

## 2011-07-17 MED ORDER — SODIUM CHLORIDE 0.9 % IR SOLN
Status: DC | PRN
  Administered 2011-07-17: 1000 mL

## 2011-07-17 MED ORDER — LIDOCAINE HCL (PF) 1 % IJ SOLN
INTRAMUSCULAR | Status: AC
  Filled 2011-07-17: qty 5

## 2011-07-17 MED ORDER — KETOROLAC TROMETHAMINE 30 MG/ML IJ SOLN
INTRAMUSCULAR | Status: AC
  Filled 2011-07-17: qty 1

## 2011-07-17 MED ORDER — CIPROFLOXACIN IN D5W 400 MG/200ML IV SOLN: 400.0000 mg | INTRAVENOUS | Status: DC

## 2011-07-17 MED ORDER — GLYCOPYRROLATE 0.2 MG/ML IJ SOLN
INTRAMUSCULAR | Status: AC
  Filled 2011-07-17: qty 2

## 2011-07-17 MED ORDER — MIDAZOLAM HCL 2 MG/2ML IJ SOLN
INTRAMUSCULAR | Status: AC
  Filled 2011-07-17: qty 2

## 2011-07-17 MED ORDER — SCOPOLAMINE 1 MG/3DAYS TD PT72
1.0000 | MEDICATED_PATCH | Freq: Once | TRANSDERMAL | Status: DC
  Administered 2011-07-17: 1.5 mg via TRANSDERMAL

## 2011-07-17 MED ORDER — KETOROLAC TROMETHAMINE 30 MG/ML IJ SOLN: 30.0000 mg | Freq: Once | INTRAMUSCULAR | Status: DC

## 2011-07-17 MED ORDER — DROPERIDOL 2.5 MG/ML IJ SOLN
INTRAMUSCULAR | Status: AC
  Filled 2011-07-17: qty 2

## 2011-07-17 MED ORDER — KETOROLAC TROMETHAMINE 30 MG/ML IJ SOLN
INTRAMUSCULAR | Status: DC | PRN
  Administered 2011-07-17: 30 mg via INTRAVENOUS

## 2011-07-17 MED ORDER — PROPOFOL 10 MG/ML IV EMUL
INTRAVENOUS | Status: DC | PRN
  Administered 2011-07-17: 150 mg via INTRAVENOUS

## 2011-07-17 MED ORDER — DEXAMETHASONE SODIUM PHOSPHATE 4 MG/ML IJ SOLN
4.0000 mg | INTRAMUSCULAR | Status: AC
  Administered 2011-07-17: 4 mg via INTRAVENOUS

## 2011-07-17 MED ORDER — PROMETHAZINE HCL 25 MG/ML IJ SOLN: 6.2500 mg | INTRAMUSCULAR | Status: DC | PRN

## 2011-07-17 MED ORDER — GLYCOPYRROLATE 0.2 MG/ML IJ SOLN
INTRAMUSCULAR | Status: AC
  Administered 2011-07-17: 0.2 mg via INTRAVENOUS
  Filled 2011-07-17: qty 1

## 2011-07-17 MED ORDER — DEXAMETHASONE SODIUM PHOSPHATE 4 MG/ML IJ SOLN
INTRAMUSCULAR | Status: AC
  Administered 2011-07-17: 4 mg via INTRAVENOUS
  Filled 2011-07-17: qty 1

## 2011-07-17 MED ORDER — OXYCODONE-ACETAMINOPHEN 7.5-500 MG PO TABS: 1.0000 | ORAL_TABLET | Freq: Four times a day (QID) | ORAL | Status: AC | PRN

## 2011-07-17 MED ORDER — ROCURONIUM BROMIDE 100 MG/10ML IV SOLN
INTRAVENOUS | Status: DC | PRN
  Administered 2011-07-17: 35 mg via INTRAVENOUS
  Administered 2011-07-17: 5 mg via INTRAVENOUS
  Administered 2011-07-17 (×2): 10 mg via INTRAVENOUS

## 2011-07-17 MED ORDER — CIPROFLOXACIN IN D5W 400 MG/200ML IV SOLN
INTRAVENOUS | Status: AC
  Administered 2011-07-17: 400 mg via INTRAVENOUS
  Filled 2011-07-17: qty 200

## 2011-07-17 MED ORDER — ONDANSETRON HCL 4 MG/2ML IJ SOLN
INTRAMUSCULAR | Status: DC | PRN
  Administered 2011-07-17: 4 mg via INTRAVENOUS

## 2011-07-17 MED ORDER — ONDANSETRON HCL 8 MG PO TABS: 8.0000 mg | ORAL_TABLET | Freq: Three times a day (TID) | ORAL | Status: AC | PRN

## 2011-07-17 MED ORDER — SUCCINYLCHOLINE CHLORIDE 20 MG/ML IJ SOLN
INTRAMUSCULAR | Status: AC
  Filled 2011-07-17: qty 1

## 2011-07-17 MED ORDER — ROCURONIUM BROMIDE 50 MG/5ML IV SOLN
INTRAVENOUS | Status: AC
  Filled 2011-07-17: qty 1

## 2011-07-17 MED ORDER — GLYCOPYRROLATE 0.2 MG/ML IJ SOLN
0.2000 mg | Freq: Once | INTRAMUSCULAR | Status: AC
  Administered 2011-07-17: 0.2 mg via INTRAVENOUS

## 2011-07-17 MED ORDER — FENTANYL CITRATE 0.05 MG/ML IJ SOLN
INTRAMUSCULAR | Status: AC
  Filled 2011-07-17: qty 2

## 2011-07-17 MED ORDER — CLINDAMYCIN PHOSPHATE 900 MG/50ML IV SOLN
900.0000 mg | INTRAVENOUS | Status: DC
  Filled 2011-07-17 (×2): qty 50

## 2011-07-17 MED ORDER — NEOSTIGMINE METHYLSULFATE 1 MG/ML IJ SOLN
INTRAMUSCULAR | Status: AC
  Filled 2011-07-17: qty 10

## 2011-07-17 MED ORDER — ONDANSETRON HCL 4 MG/2ML IJ SOLN
INTRAMUSCULAR | Status: AC
  Administered 2011-07-17: 4 mg via INTRAVENOUS
  Filled 2011-07-17: qty 2

## 2011-07-17 MED ORDER — NEOSTIGMINE METHYLSULFATE 1 MG/ML IJ SOLN
INTRAMUSCULAR | Status: DC | PRN
  Administered 2011-07-17: 4 mg via INTRAVENOUS

## 2011-07-17 MED ORDER — SODIUM CHLORIDE 0.9 % IR SOLN
Status: DC | PRN
  Administered 2011-07-17: 3000 mL

## 2011-07-17 MED ORDER — SCOPOLAMINE 1 MG/3DAYS TD PT72
MEDICATED_PATCH | TRANSDERMAL | Status: AC
  Filled 2011-07-17: qty 1

## 2011-07-17 MED ORDER — SUFENTANIL CITRATE 50 MCG/ML IV SOLN
INTRAVENOUS | Status: AC
  Filled 2011-07-17: qty 1

## 2011-07-17 MED ORDER — ONDANSETRON HCL 4 MG/2ML IJ SOLN
INTRAMUSCULAR | Status: AC
  Filled 2011-07-17: qty 4

## 2011-07-17 MED ORDER — LACTATED RINGERS IV SOLN
INTRAVENOUS | Status: DC
  Administered 2011-07-17 (×2): via INTRAVENOUS
  Administered 2011-07-17: 1000 mL via INTRAVENOUS

## 2011-07-17 MED ORDER — DROPERIDOL 2.5 MG/ML IJ SOLN
INTRAMUSCULAR | Status: DC | PRN
  Administered 2011-07-17: 0.625 mg via INTRAVENOUS

## 2011-07-17 MED ORDER — PROPOFOL 10 MG/ML IV EMUL
INTRAVENOUS | Status: AC
  Filled 2011-07-17: qty 20

## 2011-07-17 MED ORDER — GLYCOPYRROLATE 0.2 MG/ML IJ SOLN
INTRAMUSCULAR | Status: DC | PRN
  Administered 2011-07-17: .6 mg via INTRAVENOUS

## 2011-07-17 MED ORDER — ONDANSETRON HCL 4 MG/2ML IJ SOLN
4.0000 mg | Freq: Once | INTRAMUSCULAR | Status: AC
  Administered 2011-07-17: 4 mg via INTRAVENOUS

## 2011-07-17 MED ORDER — MIDAZOLAM HCL 2 MG/2ML IJ SOLN
1.0000 mg | INTRAMUSCULAR | Status: DC | PRN
  Administered 2011-07-17 (×2): 2 mg via INTRAVENOUS

## 2011-07-17 MED ORDER — KETOROLAC TROMETHAMINE 10 MG PO TABS: 10.0000 mg | ORAL_TABLET | Freq: Three times a day (TID) | ORAL | Status: AC | PRN

## 2011-07-17 MED ORDER — FENTANYL CITRATE 0.05 MG/ML IJ SOLN
25.0000 ug | INTRAMUSCULAR | Status: DC | PRN
  Administered 2011-07-17: 50 ug via INTRAVENOUS

## 2011-07-17 MED ORDER — SUFENTANIL CITRATE 50 MCG/ML IV SOLN
INTRAVENOUS | Status: DC | PRN
  Administered 2011-07-17: 5 ug via INTRAVENOUS
  Administered 2011-07-17: 20 ug via INTRAVENOUS
  Administered 2011-07-17 (×4): 10 ug via INTRAVENOUS
  Administered 2011-07-17: 5 ug via INTRAVENOUS

## 2011-07-17 MED ORDER — LIDOCAINE HCL 1 % IJ SOLN
INTRAMUSCULAR | Status: DC | PRN
  Administered 2011-07-17: 20 mg via INTRADERMAL

## 2011-07-17 SURGICAL SUPPLY — 46 items
APPLIER CLIP UNV 5X34 EPIX (ENDOMECHANICALS) ×2 IMPLANT
APR XCLPCLP 20M/L UNV 34X5 (ENDOMECHANICALS) ×1
BAG HAMPER (MISCELLANEOUS) ×2 IMPLANT
BLADE LAPAROSCOPIC MORCELL KIT (BLADE) ×2 IMPLANT
BLADE SURG SZ11 CARB STEEL (BLADE) ×2 IMPLANT
CLOTH BEACON ORANGE TIMEOUT ST (SAFETY) ×2 IMPLANT
COVER LIGHT HANDLE STERIS (MISCELLANEOUS) ×4 IMPLANT
ELECT REM PT RETURN 9FT ADLT (ELECTROSURGICAL) ×2
ELECTRODE REM PT RTRN 9FT ADLT (ELECTROSURGICAL) ×1 IMPLANT
FILTER SMOKE EVAC LAPAROSHD (FILTER) ×2 IMPLANT
FORMALIN 10 PREFIL 480ML (MISCELLANEOUS) ×2 IMPLANT
GAUZE SPONGE 4X4 16PLY XRAY LF (GAUZE/BANDAGES/DRESSINGS) IMPLANT
GLOVE BIOGEL PI IND STRL 8 (GLOVE) ×1 IMPLANT
GLOVE BIOGEL PI INDICATOR 8 (GLOVE) ×1
GLOVE ECLIPSE 6.5 STRL STRAW (GLOVE) ×1 IMPLANT
GLOVE ECLIPSE 7.0 STRL STRAW (GLOVE) ×2 IMPLANT
GLOVE ECLIPSE 8.0 STRL XLNG CF (GLOVE) ×2 IMPLANT
GLOVE EXAM NITRILE MD LF STRL (GLOVE) ×4 IMPLANT
GLOVE INDICATOR 7.0 STRL GRN (GLOVE) ×6 IMPLANT
GLOVE INDICATOR 7.5 STRL GRN (GLOVE) ×1 IMPLANT
GLOVE SS BIOGEL STRL SZ 6.5 (GLOVE) ×2 IMPLANT
GLOVE SUPERSENSE BIOGEL SZ 6.5 (GLOVE) ×2
GOWN STRL REIN 3XL LVL4 (GOWN DISPOSABLE) IMPLANT
GOWN STRL REIN XL XLG (GOWN DISPOSABLE) ×6 IMPLANT
INST SET LAPROSCOPIC GYN AP (KITS) ×2 IMPLANT
IV NS IRRIG 3000ML ARTHROMATIC (IV SOLUTION) ×2 IMPLANT
KIT ROOM TURNOVER APOR (KITS) ×2 IMPLANT
MANIFOLD NEPTUNE II (INSTRUMENTS) ×2 IMPLANT
NDL INSUFFLATION 14GA 120MM (NEEDLE) ×1 IMPLANT
NEEDLE INSUFFLATION 14GA 120MM (NEEDLE) ×2 IMPLANT
PACK PERI GYN (CUSTOM PROCEDURE TRAY) ×2 IMPLANT
PAD ARMBOARD 7.5X6 YLW CONV (MISCELLANEOUS) ×2 IMPLANT
SCALPEL HARMONIC ACE (MISCELLANEOUS) ×2 IMPLANT
SET BASIN LINEN APH (SET/KITS/TRAYS/PACK) ×2 IMPLANT
SET TUBE IRRIG SUCTION NO TIP (IRRIGATION / IRRIGATOR) ×2 IMPLANT
SOLUTION ANTI FOG 6CC (MISCELLANEOUS) ×2 IMPLANT
SPONGE GAUZE 2X2 8PLY STRL LF (GAUZE/BANDAGES/DRESSINGS) ×6 IMPLANT
STAPLER VISISTAT 35W (STAPLE) ×2 IMPLANT
SUT VICRYL 0 UR6 27IN ABS (SUTURE) ×3 IMPLANT
TAPE CLOTH SURG 4X10 WHT LF (GAUZE/BANDAGES/DRESSINGS) ×3 IMPLANT
TRAY FOLEY CATH 14FR (SET/KITS/TRAYS/PACK) ×2 IMPLANT
TROCAR Z-THAD FIOS HNDL 12X100 (TROCAR) ×2 IMPLANT
TROCAR Z-THRD FIOS HNDL 11X100 (TROCAR) ×2 IMPLANT
TROCAR Z-THREAD SLEEVE 11X100 (TROCAR) IMPLANT
TUBING HI FLO HEAT INSUFFLATOR (IRRIGATION / IRRIGATOR) ×2 IMPLANT
WARMER LAPAROSCOPE (MISCELLANEOUS) ×2 IMPLANT

## 2011-07-17 NOTE — Op Note (Addendum)
Preoperative diagnosis:  1.  Menometrorrhagia                                         2.  Dysmenorrhea                                         3.  Status post ablation 2010                                          Postoperative diagnoses:  Same as above +  Adhesions of uterus to abdominal wall  Procedure:  Laparoscopic supracervical hysterectomy  Surgeon:  Lazaro Arms, MD  Assistant:     Anesthesia:  Gen. Endotracheal  Findings: Patient had uncomplicated ablation in 2010 and had resolution of menses for 6 months or so.  Then had return of periods, heavier and more painful.  Sonogram normal.  Responded to Megestrol but has negative side effects for patient.  Description of operation:  The patient was taken to the operating room and placed in the supine position where she underwent general endotracheal anesthesia. The patient was then placed in the low lithotomy position and was prepped and draped in the usual sterile fashion. A Foley catheter was placed. An incision was made in the umbilicus.  A Veres needle was used and peritoneal insufflation was performed.  An 11 mm non-bladed trocar was placed into the peritoneal cavity with one pass under direct visualization without difficulty.  Incisions were then made in the right and left lower quadrant and 11 mm trochars were placed again under direct visualization without difficulty.  The right adnexa was grasped and the harmonic scalpel was used to coagulate and transect the right round ligament. The right utero ovarian  ligament was also coagulated and transected with good hemostasis. The uterine vessels were skeletonized and the peritoneum was reflected off the lower uterine segment.  The left uterine cornu was then grasped and the left round ligament was coagulated and transected with the harmonic scalpel the uterine ovarian ligament was then transected and coagulated. The left uterine vessels were skeletonized the peritoneal reflection was also  taken down off the lower uterine segment without difficulty.  Hemoclips were then placed just below and above  the level of the internal os bilaterally to prevent backbleeding. The uterine vessels were then coagulated and transected bilaterally using the harmonic scalpel. There was good hemostasis. 2 pedicles were then taken down the cardinal ligament medial to the transected uterine vessel pedicle and the cervix was transected at this level using the harmonic scalpel on low setting.  All pedicles were hemostatic and the specimen was freely transected from the cervix.  The uterine morcellator was then placed in the left lower quadrant and the uterus and portion of cervix were removed in strips using the morcellator. This was done safely under direct visualization.  The pelvis was irrigated and all pedicles found to be hemostatic. Additional fluid was left inside the peritoneum to prevent her diminish future adhesion formation. The ovaries were normal and, as per preoperative plan, left in place.  All 3 incisions were closed.  First the  peritoneum and fascia closed with single sutures using 0 Vicryl.  Subcutaneous tissue was also closed using 0 Vicryl and the skin was closed using skin staples. The patient tolerated the procedure well she experienced approximately 150 cc of blood loss. She received Cipro 400 and Cleocin 900 and 30 mg of Toradol preoperatively prophylactically.  She was awakened from anesthesia taken to the recovery room in good stable condition with all counts being CORRECT x3.  EURE,LUTHER H 07/17/2011, 1:19 PM

## 2011-07-17 NOTE — Anesthesia Postprocedure Evaluation (Signed)
  Anesthesia Post-op Note  Patient: Doris Davis  Procedure(s) Performed:  LAPAROSCOPIC SUPRACERVICAL HYSTERECTOMY  Patient Location: PACU  Anesthesia Type: General  Level of Consciousness: awake, alert  and oriented  Airway and Oxygen Therapy: Patient Spontanous Breathing and Patient connected to nasal cannula oxygen  Post-op Pain: none  Post-op Assessment: Post-op Vital signs reviewed, Patient's Cardiovascular Status Stable and Respiratory Function Stable  Post-op Vital Signs: Reviewed and stable  Complications: No apparent anesthesia complications

## 2011-07-17 NOTE — Progress Notes (Signed)
Pt ambulated to BR. Was unable to void. States she doesn't feel like she needs to void. Instructed pt to call [phone] and ask for Dr on call for Dr Despina Hidden if unable to void after being home and drinking more fluids. States she wants to go home now.

## 2011-07-17 NOTE — Progress Notes (Signed)
Documented in error wrong chart

## 2011-07-17 NOTE — Anesthesia Procedure Notes (Signed)
Procedure Name: Intubation Date/Time: 07/17/2011 11:06 AM Performed by: Minerva Areola Pre-anesthesia Checklist: Patient identified, Patient being monitored, Timeout performed, Emergency Drugs available and Suction available Patient Re-evaluated:Patient Re-evaluated prior to inductionOxygen Delivery Method: Circle System Utilized Preoxygenation: Pre-oxygenation with 100% oxygen Intubation Type: IV induction Ventilation: Mask ventilation without difficulty Laryngoscope Size: Miller and 2 Grade View: Grade I Tube type: Oral Tube size: 7.0 mm Number of attempts: 1 Airway Equipment and Method: stylet Placement Confirmation: ETT inserted through vocal cords under direct vision,  positive ETCO2 and breath sounds checked- equal and bilateral Secured at: 21 cm Tube secured with: Tape Dental Injury: Teeth and Oropharynx as per pre-operative assessment

## 2011-07-17 NOTE — Interval H&P Note (Signed)
History and Physical Interval Note:   07/17/2011   10:29 AM   Doris Davis  has presented today for surgery, with the diagnosis of menometrorrhagia dysmenorrhea  The various methods of treatment have been discussed with the patient and family. After consideration of risks, benefits and other options for treatment, the patient has consented to  Procedure(s): LAPAROSCOPIC SUPRACERVICAL HYSTERECTOMY as a surgical intervention .  The patients' history has been reviewed, patient examined, no change in status, stable for surgery.  I have reviewed the patients' chart and labs.  Questions were answered to the patient's satisfaction.     Lazaro Arms  MD

## 2011-07-17 NOTE — Progress Notes (Signed)
Documented by error wrong chart

## 2011-07-17 NOTE — H&P (Signed)
Doris Davis is an 40 y.o. female. G2 P2 status post and endometrial ablation in 2010 which went well without incident. Patient responded positively for the first 4 months or so and then began having periods again, which have gotten progressively heavier and more painful.  We used megace for several months which patient responds to but makes her feel bad and gain weight.  Repeat sonogram is normal.  After discussing our conservative options patient and i have decided to proceed with laparoscopic supracervical hysterectomy.    Past Medical History  Diagnosis Date  . Hypertension   . PONV (postoperative nausea and vomiting)     sts with ablation she was given meds for nausea and "got sicker".  . Asthma     with contact with cats    Past Surgical History  Procedure Date  . Fracture surgery 2005    left ankle-[place]  . Endometrial ablation 2010    [facility]  . Cesarean section 1995    Family History  Problem Relation Age of Onset  . Anesthesia problems Neg Hx   . Malignant hyperthermia Neg Hx   . Hypotension Neg Hx   . Pseudochol deficiency Neg Hx   . Coronary artery disease Mother     Social History:  reports that she has been smoking Cigarettes.  She has a 10 pack-year smoking history. She does not have any smokeless tobacco history on file. She reports that she drinks alcohol. She reports that she does not use illicit drugs.  Allergies:  Allergies  Allergen Reactions  . Coconut Oil Anaphylaxis  . Penicillins Anaphylaxis    Prescriptions prior to admission  Medication Sig Dispense Refill  . enalapril (VASOTEC) 10 MG tablet Take 10 mg by mouth daily.        . Probiotic Product (ALIGN PO) Take 1 tablet by mouth every morning. OTC med to help w/ IBS       . valACYclovir (VALTREX) 500 MG tablet Take 500 mg by mouth 2 (two) times daily. hives         ROS  Review of Systems  Constitutional: Negative for fever, chills, weight loss, malaise/fatigue and diaphoresis.  HENT:  Negative for hearing loss, ear pain, nosebleeds, congestion, sore throat, neck pain, tinnitus and ear discharge.   Eyes: Negative for blurred vision, double vision, photophobia, pain, discharge and redness.  Respiratory: Negative for cough, hemoptysis, sputum production, shortness of breath, wheezing and stridor.   Cardiovascular: Negative for chest pain, palpitations, orthopnea, claudication, leg swelling and PND.  Gastrointestinal:Negative for abdominal pain. Negative for heartburn, nausea, vomiting, diarrhea, constipation, blood in stool and melena.  Genitourinary: Negative for dysuria, urgency, frequency, hematuria and flank pain.  Musculoskeletal: Negative for myalgias, back pain, joint pain and falls.  Skin: Negative for itching and rash.  Neurological: Negative for dizziness, tingling, tremors, sensory change, speech change, focal weakness, seizures, loss of consciousness, weakness and headaches.  Endo/Heme/Allergies: Negative for environmental allergies and polydipsia. Does not bruise/bleed easily.  Psychiatric/Behavioral: Negative for depression, suicidal ideas, hallucinations, memory loss and substance abuse. The patient is not nervous/anxious and does not have insomnia.      Blood pressure 127/91, pulse 84, temperature 98.4 F (36.9 C), temperature source Oral, resp. rate 16, last menstrual period 07/10/2011, SpO2 98.00%. Physical Exam Physical Exam  Vitals reviewed. Constitutional: She is oriented to person, place, and time. She appears well-developed and well-nourished.  HENT:  Head: Normocephalic and atraumatic.  Right Ear: External ear normal.  Left Ear: External ear normal.  Nose: Nose normal.  Mouth/Throat: Oropharynx is clear and moist.  Eyes: Conjunctivae and EOM are normal. Pupils are equal, round, and reactive to light. Right eye exhibits no discharge. Left eye exhibits no discharge. No scleral icterus.  Neck: Normal range of motion. Neck supple. No tracheal deviation  present. No thyromegaly present.  Cardiovascular: Normal rate, regular rhythm, normal heart sounds and intact distal pulses.  Exam reveals no gallop and no friction rub.   No murmur heard. Respiratory: Effort normal and breath sounds normal. No respiratory distress. She has no wheezes. She has no rales. She exhibits no tenderness.  GI: Soft. Bowel sounds are normal. She exhibits no distension and no mass. There is no tenderness. There is no rebound and no guarding.  Genitourinary:       Vulva is normal without lesions Vagina is pink moist without discharge Cervix normal in appearance and pap is normal Uterus is normal size shape and contour. Adnexa is negative with normal sized ovaries by sonogram  Musculoskeletal: Normal range of motion. She exhibits no edema and no tenderness.  Neurological: She is alert and oriented to person, place, and time. She has normal reflexes. She displays normal reflexes. No cranial nerve deficit. She exhibits normal muscle tone. Coordination normal.  Skin: Skin is warm and dry. No rash noted. No erythema. No pallor.  Psychiatric: She has a normal mood and affect. Her behavior is normal. Judgment and thought content normal.   Recent Results (from the past 336 hour(s))  SURGICAL PCR SCREEN   Collection Time   07/12/11  3:30 PM      Component Value Range   MRSA, PCR NEGATIVE  NEGATIVE    Staphylococcus aureus NEGATIVE  NEGATIVE   CBC   Collection Time   07/12/11  3:30 PM      Component Value Range   WBC 10.1  4.0 - 10.5 (K/uL)   RBC 4.35  3.87 - 5.11 (MIL/uL)   Hemoglobin 12.8  12.0 - 15.0 (g/dL)   HCT 16.1  09.6 - 04.5 (%)   MCV 87.4  78.0 - 100.0 (fL)   MCH 29.4  26.0 - 34.0 (pg)   MCHC 33.7  30.0 - 36.0 (g/dL)   RDW 40.9  81.1 - 91.4 (%)   Platelets 226  150 - 400 (K/uL)  COMPREHENSIVE METABOLIC PANEL   Collection Time   07/12/11  3:30 PM      Component Value Range   Sodium 136  135 - 145 (mEq/L)   Potassium 3.9  3.5 - 5.1 (mEq/L)   Chloride 101   96 - 112 (mEq/L)   CO2 28  19 - 32 (mEq/L)   Glucose, Bld 78  70 - 99 (mg/dL)   BUN 8  6 - 23 (mg/dL)   Creatinine, Ser 7.82  0.50 - 1.10 (mg/dL)   Calcium 95.6  8.4 - 10.5 (mg/dL)   Total Protein 7.0  6.0 - 8.3 (g/dL)   Albumin 4.1  3.5 - 5.2 (g/dL)   AST 14  0 - 37 (U/L)   ALT 19  0 - 35 (U/L)   Alkaline Phosphatase 82  39 - 117 (U/L)   Total Bilirubin 0.3  0.3 - 1.2 (mg/dL)   GFR calc non Af Amer >90  >90 (mL/min)   GFR calc Af Amer >90  >90 (mL/min)  HCG, QUANTITATIVE, PREGNANCY   Collection Time   07/12/11  3:30 PM      Component Value Range   hCG, Beta Chain, Quant, S <1  <  5 (mIU/mL)  TYPE AND SCREEN   Collection Time   07/12/11  3:30 PM      Component Value Range   ABO/RH(D) O NEG     Antibody Screen NEG     Sample Expiration 07/26/2011    URINALYSIS, ROUTINE W REFLEX MICROSCOPIC   Collection Time   07/12/11  3:30 PM      Component Value Range   Color, Urine YELLOW  YELLOW    Appearance CLEAR  CLEAR    Specific Gravity, Urine 1.010  1.005 - 1.030    pH 6.5  5.0 - 8.0    Glucose, UA NEGATIVE  NEGATIVE (mg/dL)   Hgb urine dipstick NEGATIVE  NEGATIVE    Bilirubin Urine NEGATIVE  NEGATIVE    Ketones, ur NEGATIVE  NEGATIVE (mg/dL)   Protein, ur NEGATIVE  NEGATIVE (mg/dL)   Urobilinogen, UA 0.2  0.0 - 1.0 (mg/dL)   Nitrite NEGATIVE  NEGATIVE    Leukocytes, UA NEGATIVE  NEGATIVE          Assessment/Plan: 1.  Menometrorrhagia 2.  Dysmenorrhea 3.  Status post endometrial ablation  Lpaparoscopic supracervical hysterectomy.  Pt understands the risks of surgery including but not limited t  excessive bleeding requiring transfusion or reoperation, post-operative infection requiring prolonged hospitalization or re-hospitalization and antibiotic therapy, and damage to other organs including bladder, bowel, ureters and major vessels.  The patient also understands the alternative treatment options which were discussed in full.  All questions were answered.   EURE,LUTHER  H 07/17/2011, 10:23 AM

## 2011-07-17 NOTE — Progress Notes (Signed)
Documented in error wrong chart 

## 2011-07-17 NOTE — Anesthesia Preprocedure Evaluation (Addendum)
Anesthesia Evaluation  Patient identified by MRN, date of birth, ID band Patient awake    Reviewed: Allergy & Precautions, H&P , NPO status , Patient's Chart, lab work & pertinent test results  History of Anesthesia Complications (+) PONV  Airway Mallampati: II TM Distance: >3 FB Neck ROM: Full    Dental  (+) Teeth Intact   Pulmonary Current Smoker,    Pulmonary exam normal       Cardiovascular hypertension, Pt. on medications Regular Normal    Neuro/Psych    GI/Hepatic   Endo/Other  Thyroid nodules - stable  Renal/GU      Musculoskeletal   Abdominal   Peds  Hematology   Anesthesia Other Findings   Reproductive/Obstetrics                          Anesthesia Physical Anesthesia Plan  ASA: II  Anesthesia Plan: General   Post-op Pain Management:    Induction: Intravenous  Airway Management Planned: Oral ETT  Additional Equipment:   Intra-op Plan:   Post-operative Plan: Extubation in OR  Informed Consent: I have reviewed the patients History and Physical, chart, labs and discussed the procedure including the risks, benefits and alternatives for the proposed anesthesia with the patient or authorized representative who has indicated his/her understanding and acceptance.     Plan Discussed with: CRNA  Anesthesia Plan Comments: (Hx PONV)        Anesthesia Quick Evaluation

## 2011-07-17 NOTE — Progress Notes (Signed)
Documented by error wrong chart 

## 2011-07-17 NOTE — Transfer of Care (Signed)
Immediate Anesthesia Transfer of Care Note  Patient: Doris Davis  Procedure(s) Performed:  LAPAROSCOPIC SUPRACERVICAL HYSTERECTOMY  Patient Location: PACU  Anesthesia Type: General  Level of Consciousness: awake, alert  and oriented  Airway & Oxygen Therapy: Patient Spontanous Breathing and Patient connected to nasal cannula oxygen  Post-op Assessment: Report given to PACU RN and Post -op Vital signs reviewed and stable  Post vital signs: Reviewed and stable  Complications: No apparent anesthesia complications

## 2011-07-23 ENCOUNTER — Encounter (HOSPITAL_COMMUNITY): Payer: Self-pay | Admitting: Obstetrics & Gynecology

## 2012-06-10 ENCOUNTER — Ambulatory Visit: Payer: 59 | Admitting: Orthopedic Surgery

## 2012-12-08 ENCOUNTER — Encounter: Payer: Self-pay | Admitting: *Deleted

## 2012-12-09 ENCOUNTER — Encounter: Payer: Self-pay | Admitting: Advanced Practice Midwife

## 2012-12-09 ENCOUNTER — Ambulatory Visit (INDEPENDENT_AMBULATORY_CARE_PROVIDER_SITE_OTHER): Payer: BC Managed Care – PPO | Admitting: Advanced Practice Midwife

## 2012-12-09 VITALS — BP 120/90 | Wt 170.5 lb

## 2012-12-09 DIAGNOSIS — N764 Abscess of vulva: Secondary | ICD-10-CM

## 2012-12-09 NOTE — Progress Notes (Signed)
Chief Complaint:  ?cyst/ingrown hair around groin area   Doris Davis is  42 y.o. G2P0010.  Patient's last menstrual period was 07/05/2011..  She presents complaining of ?cyst/ingrown hair around groin area . Onset is described as gradual and has been present for  10 days. On L labia minora, was much bigger, now smaller, but still sore Has a Friend With Benefits :).  Safer sex practices discussed     Past Medical History  Diagnosis Date  . Hypertension   . PONV (postoperative nausea and vomiting)     sts with ablation she was given meds for nausea and "got sicker".  . Asthma     with contact with cats  . Venereal disease 1990    chlamydia  . Allergy     Past Surgical History  Procedure Laterality Date  . Fracture surgery  2005    left ankle-[place]  . Endometrial ablation  2010    [facility]  . Cesarean section  1995  . Laparoscopic supracervical hysterectomy  07/17/2011    Procedure: LAPAROSCOPIC SUPRACERVICAL HYSTERECTOMY;  Surgeon: Lazaro Arms, MD;  Location: [facility];  Service: Gynecology;  Laterality: N/A;  . Bunionectomy    . Abdominal hysterectomy      Family History  Problem Relation Age of Onset  . Anesthesia problems Neg Hx   . Malignant hyperthermia Neg Hx   . Hypotension Neg Hx   . Pseudochol deficiency Neg Hx   . Coronary artery disease Mother   . Diabetes Other   . Hypertension Other   . Stroke Other     History  Substance Use Topics  . Smoking status: Current Every Day Smoker -- 0.50 packs/day for 20 years    Types: Cigarettes  . Smokeless tobacco: Not on file  . Alcohol Use: Yes     Comment: weekends    Allergies:  Allergies  Allergen Reactions  . Coconut Oil Anaphylaxis  . Penicillins Anaphylaxis       Review of Systems  Review of Systems  Constitutional: Negative for fever, chills, weight loss, malaise/fatigue and diaphoresis.  HENT: Negative for hearing loss, ear pain, nosebleeds, congestion, sore throat,  neck pain, tinnitus and ear discharge.   Eyes: Negative for blurred vision, double vision, photophobia, pain, discharge and redness.  Respiratory: Negative for cough, hemoptysis, sputum production, shortness of breath, wheezing and stridor.   Cardiovascular: Negative for chest pain, palpitations, orthopnea,  leg swelling  Gastrointestinal: Negative for heartburn, nausea, vomiting, diarrhea, constipation, blood in stool Genitourinary: Negative for dysuria, urgency, frequency, hematuria and flank pain.  Musculoskeletal: Negative for myalgias, back pain, joint pain and falls.  Skin: Negative for itching and rash.  Neurological: Negative for dizziness, tingling, tremors, sensory change, speech change, focal weakness, seizures, loss of consciousness, weakness and headaches.  Endo/Heme/Allergies: Negative for environmental allergies and polydipsia. Does not bruise/bleed easily.  Psychiatric/Behavioral: Negative for depression, suicidal ideas, hallucinations, memory loss and substance abuse. The patient is not nervous/anxious and does not have insomnia.      Physical Exam   Blood pressure 120/90, weight 170 lb 8 oz (77.338 kg), last menstrual period 07/05/2011.  General: General appearance - alert, well appearing, and in no distress Pelvic -Tiny furuncle L labia minora--not infected.  Squeezed with fair results  Labs: No results found for this or any previous visit (from the past 24 hour(s)). Imaging Studies:  No results  found.   Assessment: Furuncle, L labia  Plan: Soak in warm water and try to evacuate, or can leave alone, as it should reabsorb  CRESENZO-DISHMAN,Tyesha Joffe

## 2013-03-06 ENCOUNTER — Emergency Department (HOSPITAL_COMMUNITY): Payer: BC Managed Care – PPO

## 2013-03-06 ENCOUNTER — Emergency Department (HOSPITAL_COMMUNITY)
Admission: EM | Admit: 2013-03-06 | Discharge: 2013-03-06 | Disposition: A | Discharge status: Home / Self Care | Payer: BC Managed Care – PPO | Attending: Emergency Medicine | Admitting: Emergency Medicine

## 2013-03-06 ENCOUNTER — Encounter (HOSPITAL_COMMUNITY): Payer: Self-pay | Admitting: *Deleted

## 2013-03-06 DIAGNOSIS — F17200 Nicotine dependence, unspecified, uncomplicated: Secondary | ICD-10-CM | POA: Insufficient documentation

## 2013-03-06 DIAGNOSIS — Z9889 Other specified postprocedural states: Secondary | ICD-10-CM | POA: Insufficient documentation

## 2013-03-06 DIAGNOSIS — I10 Essential (primary) hypertension: Secondary | ICD-10-CM | POA: Insufficient documentation

## 2013-03-06 DIAGNOSIS — J45909 Unspecified asthma, uncomplicated: Secondary | ICD-10-CM | POA: Insufficient documentation

## 2013-03-06 DIAGNOSIS — R29898 Other symptoms and signs involving the musculoskeletal system: Secondary | ICD-10-CM | POA: Insufficient documentation

## 2013-03-06 DIAGNOSIS — Z880 Allergy status to penicillin: Secondary | ICD-10-CM | POA: Insufficient documentation

## 2013-03-06 DIAGNOSIS — M19079 Primary osteoarthritis, unspecified ankle and foot: Secondary | ICD-10-CM

## 2013-03-06 DIAGNOSIS — Z8619 Personal history of other infectious and parasitic diseases: Secondary | ICD-10-CM | POA: Insufficient documentation

## 2013-03-06 DIAGNOSIS — G8929 Other chronic pain: Secondary | ICD-10-CM | POA: Insufficient documentation

## 2013-03-06 DIAGNOSIS — Z79899 Other long term (current) drug therapy: Secondary | ICD-10-CM | POA: Insufficient documentation

## 2013-03-06 LAB — CBC
Hemoglobin: 13.3 g/dL (ref 12.0–15.0)
MCH: 28.4 pg (ref 26.0–34.0)
MCV: 86.6 fL (ref 78.0–100.0)
Platelets: 232 10*3/uL (ref 150–400)
RBC: 4.69 MIL/uL (ref 3.87–5.11)
WBC: 11.6 10*3/uL — ABNORMAL HIGH (ref 4.0–10.5)

## 2013-03-06 MED ORDER — HYDROCODONE-ACETAMINOPHEN 5-325 MG PO TABS: 1.0000 | ORAL_TABLET | ORAL | Status: DC | PRN

## 2013-03-06 NOTE — ED Notes (Signed)
Pt c/o right foot pain that radiates up to lower right leg. Pt describes pain as "sharp and twisting". Pain has gradually worsened over last few days.

## 2013-03-06 NOTE — ED Provider Notes (Signed)
Doris Davis is a 42 y.o. female who states that yesterday. She injured her right forefoot while walking. She felt like the forefoot, "collapsed". Since that time, she's had pain in the site, but is able to walk. She states that she had a nonunion, after a bunionectomy that was treated with surgical repair. She has mild, ongoing pain since the procedure. Now, today, she is able to walk, and states that there is actually less swelling in the area than usual. She is worried that she will become a fall risk.  Right forefoot, tender on the distal first metatarsal, with less tenderness on the first MTP joint. There is no gross deformity. Both the transverse and the plantar arch appear to have normal architecture. There is no inflammation or joint swelling, consistent with acute arthritis. There is no skin breakdown.  Radiologic imaging report reviewed and images by Radiography; there is a significant first MTP DJD, there is no apparent fracture   - viewed, by me.   Medical screening examination/treatment/procedure(s) were conducted as a shared visit with non-physician practitioner(s) and myself.  I personally evaluated the patient during the encounter  Flint Melter, MD 03/06/13 [phone]

## 2013-03-06 NOTE — ED Provider Notes (Signed)
History    CSN: [id number] Arrival date & time 03/06/13  1250  First MD Initiated Contact with Patient 03/06/13 1304     Chief Complaint  Patient presents with  . Leg Pain   (Consider location/radiation/quality/duration/timing/severity/associated sxs/prior Treatment) Patient is a 42 y.o. female presenting with leg pain. The history is provided by the patient.  Leg Pain Lower extremity pain location: right foot and calf. Time since incident:  2 days Pain details:    Quality:  Cramping and shooting   Severity:  Moderate   Onset quality:  Gradual Chronicity:  Recurrent Dislocation: no   Foreign body present:  No foreign bodies Prior injury to area:  No Relieved by:  Nothing Worsened by:  Activity and bearing weight Ineffective treatments:  NSAIDs Associated symptoms: decreased ROM   Associated symptoms: no fever, no muscle weakness, no neck pain, no numbness, no swelling and no tingling    Doris Davis is a 42 y.o. female who presents to the ED with right foot and leg pain. The pain in her foot has been chronic since she was diagnosed with a spur in her great toe. She had surgery and has a pin in the joint. Over the past 2 days the pain is worse than usual and she has been walking on the side of her foot and now she has severe pain that goes from her big toe down the side of her foot and up the calf of her leg. The pain is severe. The pain increase with walking, standing or any weight bearing.  Past Medical History  Diagnosis Date  . Hypertension   . PONV (postoperative nausea and vomiting)     sts with ablation she was given meds for nausea and "got sicker".  . Asthma     with contact with cats  . Venereal disease 1990    chlamydia  . Allergy    Past Surgical History  Procedure Laterality Date  . Fracture surgery  2005    left ankle-[place]  . Endometrial ablation  2010    [facility]  . Cesarean section  1995  . Laparoscopic supracervical hysterectomy  07/17/2011   Procedure: LAPAROSCOPIC SUPRACERVICAL HYSTERECTOMY;  Surgeon: Lazaro Arms, MD;  Location: [facility];  Service: Gynecology;  Laterality: N/A;  . Bunionectomy    . Abdominal hysterectomy     Family History  Problem Relation Age of Onset  . Anesthesia problems Neg Hx   . Malignant hyperthermia Neg Hx   . Hypotension Neg Hx   . Pseudochol deficiency Neg Hx   . Coronary artery disease Mother   . Diabetes Other   . Hypertension Other   . Stroke Other    History  Substance Use Topics  . Smoking status: Current Every Day Smoker -- 0.50 packs/day for 20 years    Types: Cigarettes  . Smokeless tobacco: Not on file  . Alcohol Use: Yes     Comment: weekends   OB History   Grav Para Term Preterm Abortions TAB SAB Ect Mult Living   2 1   1  1         Review of Systems  Constitutional: Negative for fever.  HENT: Negative for neck pain.   Gastrointestinal: Negative for nausea, vomiting and abdominal pain.  Skin: Negative for rash.  Allergic/Immunologic: Negative for immunocompromised state.  Neurological: Negative for headaches.  Psychiatric/Behavioral: The patient is not nervous/anxious.     Allergies  Coconut oil and Penicillins  Home Medications   Current  Outpatient Rx  Name  Route  Sig  Dispense  Refill  . enalapril (VASOTEC) 10 MG tablet   Oral   Take 10 mg by mouth daily.           . Probiotic Product (ALIGN PO)   Oral   Take 1 tablet by mouth every morning. OTC med to help w/ IBS          . valACYclovir (VALTREX) 500 MG tablet   Oral   Take 500 mg by mouth as needed. hives          BP 150/90  Pulse 88  Temp(Src) 98.1 F (36.7 C) (Oral)  Resp 18  Ht 5\' 3"  (1.6 m)  Wt 165 lb (74.844 kg)  BMI 29.24 kg/m2  SpO2 100%  LMP 07/05/2011 Physical Exam  Nursing note and vitals reviewed. Constitutional: She is oriented to person, place, and time. She appears well-developed and well-nourished. No distress.  HENT:  Head: Normocephalic.  Eyes: EOM are normal.    Neck: Normal range of motion. Neck supple.  Cardiovascular: Normal rate.   Pulmonary/Chest: Effort normal.  Abdominal: Soft. There is no tenderness.  Musculoskeletal:       Right foot: She exhibits decreased range of motion, tenderness and bony tenderness. She exhibits normal capillary refill and no deformity. Swelling: minimal.       Feet:  Tender right great toe with radiation to the right calf. Right calf tender with exam. Pedal pulse present with adequate circulation and good touch sensation.   Neurological: She is alert and oriented to person, place, and time. No cranial nerve deficit.  Skin: Skin is warm and dry.  Psychiatric: She has a normal mood and affect. Her behavior is normal.    ED Course  Procedures  Results for orders placed during the hospital encounter of 03/06/13 (from the past 24 hour(s))  D-DIMER, QUANTITATIVE     Status: None   Collection Time    03/06/13  2:31 PM      Result Value Range   D-Dimer, Quant <0.27  0.00 - 0.48 ug/mL-FEU  CBC     Status: Abnormal   Collection Time    03/06/13  2:49 PM      Result Value Range   WBC 11.6 (*) 4.0 - 10.5 K/uL   RBC 4.69  3.87 - 5.11 MIL/uL   Hemoglobin 13.3  12.0 - 15.0 g/dL   HCT 14.7  82.9 - 56.2 %   MCV 86.6  78.0 - 100.0 fL   MCH 28.4  26.0 - 34.0 pg   MCHC 32.8  30.0 - 36.0 g/dL   RDW 13.0  86.5 - 78.4 %   Platelets 232  150 - 400 K/uL    MDM: patient request to speak with EDP about her x-ray. Dr. Effie Shy in to examine the patient and discuss results.    42 y.o. female with arthritis and bone spur of right great toe.  I have reviewed this patient's vital signs, nurses notes, appropriate labs and imaging.  I have discussed findings and plan of care with the patient and she voices understanding. Will place in post op shoe and treat pain. She will follow up with her doctor. She will continue her ibuprofen.    Medication List    TAKE these medications       HYDROcodone-acetaminophen 5-325 MG per tablet   Commonly known as:  NORCO/VICODIN  Take 1 tablet by mouth every 4 (four) hours as needed.  ASK your doctor about these medications       ALIGN PO  Take 1 tablet by mouth every morning. OTC med to help w/ IBS     enalapril 10 MG tablet  Commonly known as:  VASOTEC  Take 10 mg by mouth daily.          [place], [place] 03/06/13 [phone]

## 2013-03-06 NOTE — ED Notes (Addendum)
Pt c/o right calf pain, states that she was walking last night and her right leg "gave way" on her. Pt has been having problems with her right foot and has been favoring her right foot when walking to keep the pressure off the ball of foot, wonders if she has "aggravated her right leg"  Today c/o of cramping to right calf area.

## 2013-03-31 ENCOUNTER — Encounter: Payer: Self-pay | Admitting: Orthopedic Surgery

## 2013-03-31 ENCOUNTER — Ambulatory Visit (INDEPENDENT_AMBULATORY_CARE_PROVIDER_SITE_OTHER): Payer: BC Managed Care – PPO | Admitting: Orthopedic Surgery

## 2013-03-31 VITALS — BP 124/84 | Ht 63.0 in | Wt 162.0 lb

## 2013-03-31 DIAGNOSIS — L089 Local infection of the skin and subcutaneous tissue, unspecified: Secondary | ICD-10-CM

## 2013-03-31 DIAGNOSIS — M79671 Pain in right foot: Secondary | ICD-10-CM

## 2013-03-31 DIAGNOSIS — M79609 Pain in unspecified limb: Secondary | ICD-10-CM

## 2013-03-31 NOTE — Patient Instructions (Signed)
MRI Ordered. 

## 2013-04-01 ENCOUNTER — Encounter: Payer: Self-pay | Admitting: Orthopedic Surgery

## 2013-04-01 DIAGNOSIS — M79671 Pain in right foot: Secondary | ICD-10-CM | POA: Insufficient documentation

## 2013-04-01 DIAGNOSIS — L089 Local infection of the skin and subcutaneous tissue, unspecified: Secondary | ICD-10-CM | POA: Insufficient documentation

## 2013-04-01 LAB — CBC WITH DIFFERENTIAL/PLATELET
Eosinophils Relative: 2 % (ref 0–5)
HCT: 36.7 % (ref 36.0–46.0)
Hemoglobin: 12 g/dL (ref 12.0–15.0)
Lymphocytes Relative: 28 % (ref 12–46)
MCHC: 32.7 g/dL (ref 30.0–36.0)
MCV: 84.2 fL (ref 78.0–100.0)
Monocytes Absolute: 0.6 10*3/uL (ref 0.1–1.0)
Monocytes Relative: 7 % (ref 3–12)
Neutro Abs: 6.2 10*3/uL (ref 1.7–7.7)
RDW: 14.6 % (ref 11.5–15.5)
WBC: 9.7 10*3/uL (ref 4.0–10.5)

## 2013-04-01 LAB — C-REACTIVE PROTEIN: CRP: <0.5 mg/dL (ref <0.60)

## 2013-04-01 NOTE — Progress Notes (Signed)
Patient ID: Doris Davis, female   DOB: [birth year], 42 y.o.   MRN: [id number] Chief Complaint  Patient presents with  . Foot Pain    Right foot pain ED follow up   This is a 42 year old female who had a bunionectomy for pain in the great toe by Dr. Pricilla Holm in [facility] on November 2013. Apparently she did well up until postoperatively when she developed swelling and pain around the osteotomy of the first metatarsal. She was treated with continued immobilization and eventually a bone stimulator. She has swelling in between the first and second metatarsals and pain there as well as pain at the metatarsophalangeal joint with x-ray finding showing loss of joint space and severe arthritis. According to the preoperative notes there was arthritis in the great toe a diagnosis of hallux rigidus was also made.  The patient is very concerned at this point because she can't or hasn't returned to her normal activities of daily living and she still in a Cam Walker. She thinks the bone stimulator is making things worse.  She's having throbbing stabbing 8/10 pain and a feeling that the foot is collapsing on the plantar aspect which causes the pain to increase. She says she can't stand she can't take a shower standing on both feet she's having transfer pain on the lateral border of the foot now.  She does review of systems consistent with allergy to coconut she has some seasonal allergies her review of systems is otherwise normal  She's allergic to penicillin  She has irritable bowel syndrome, hypertension  She's had a hysterectomy in 2012 bunionectomy as stated on the right great toe and left ankle Achilles tendon rupture in 2005 repaired did well. She is on enalapril and align.  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 She is oriented x3 her mood and affect are normal. She walks on the outer border of the right foot she does not stand with full weightbearing on medial side of the foot. She has a well-healed incision mild tenderness over the metatarsophalangeal joint between the first and second metatarsal is swelling and tenderness and hypersensitivity there is limited motion at the metatarsophalangeal joint but the joint is stable. There is no muscle atrophy. Pulse and temperature of the foot are normal there is no erythema.  Upper extremity exam  The right and left upper extremity:   Inspection and palpation revealed no abnormalities in the upper extremities.   Range of motion is full without contracture.  Motor exam is normal with grade 5 strength.  The joints are fully reduced without subluxation.  There is no atrophy or tremor and muscle tone is normal.  All joints are stable.   The right foot looks normal range of motion is normal stability normal strength normal skin is normal no swelling tenderness or pain The x-rays from June of 2014 show very limited joint space between the metatarsophalangeal joint looks like a corrected hallux valgus angle. There is soft tissue swelling between the first and second metatarsal.  Impression rule out infection Status post bunionectomy Synovitis  I recommend a C-reactive protein and white count sedimentation rate to rule out infection and then an  MRI to evaluate the soft tissue swelling and mass in between the first and second metatarsals

## 2013-04-12 ENCOUNTER — Ambulatory Visit (HOSPITAL_COMMUNITY)
Admission: RE | Admit: 2013-04-12 | Discharge: 2013-04-12 | Disposition: A | Discharge status: Home / Self Care | Payer: BC Managed Care – PPO | Source: Ambulatory Visit | Attending: Orthopedic Surgery | Admitting: Orthopedic Surgery

## 2013-04-12 DIAGNOSIS — X58XXXA Exposure to other specified factors, initial encounter: Secondary | ICD-10-CM | POA: Insufficient documentation

## 2013-04-12 DIAGNOSIS — M79671 Pain in right foot: Secondary | ICD-10-CM

## 2013-04-12 DIAGNOSIS — M79609 Pain in unspecified limb: Secondary | ICD-10-CM | POA: Insufficient documentation

## 2013-04-12 DIAGNOSIS — M8430XA Stress fracture, unspecified site, initial encounter for fracture: Secondary | ICD-10-CM | POA: Insufficient documentation

## 2013-04-12 MED ORDER — GADOBENATE DIMEGLUMINE 529 MG/ML IV SOLN
15.0000 mL | Freq: Once | INTRAVENOUS | Status: AC | PRN
  Administered 2013-04-12: 15 mL via INTRAVENOUS

## 2013-04-13 LAB — POCT I-STAT, CHEM 8
BUN: 9 mg/dL (ref 6–23)
Chloride: 104 mEq/L (ref 96–112)
Creatinine, Ser: 0.7 mg/dL (ref 0.50–1.10)
Potassium: 3.7 mEq/L (ref 3.5–5.1)
Sodium: 138 mEq/L (ref 135–145)

## 2013-04-19 ENCOUNTER — Encounter: Payer: Self-pay | Admitting: Orthopedic Surgery

## 2013-04-19 ENCOUNTER — Ambulatory Visit (INDEPENDENT_AMBULATORY_CARE_PROVIDER_SITE_OTHER): Payer: BC Managed Care – PPO | Admitting: Orthopedic Surgery

## 2013-04-19 VITALS — BP 122/81 | Ht 63.0 in | Wt 162.0 lb

## 2013-04-19 DIAGNOSIS — S92301A Fracture of unspecified metatarsal bone(s), right foot, initial encounter for closed fracture: Secondary | ICD-10-CM

## 2013-04-19 DIAGNOSIS — S92309A Fracture of unspecified metatarsal bone(s), unspecified foot, initial encounter for closed fracture: Secondary | ICD-10-CM

## 2013-04-19 NOTE — Progress Notes (Signed)
Patient ID: Doris Davis, female   DOB: [birth year], 42 y.o.   MRN: [id number] Chief Complaint  Patient presents with  . Follow-up    MRI results of right foot.    History: Status post bunionectomy, history of persistent pain in the right foot over the second metatarsal shaft and MRI and it shows she has a stress fracture the second metatarsal just proximal and mid shaft  Review of systems : Persistent swelling in the right foot  Review of MRI and report and agree that there is a stress fracture there are no issues with the bunion that I can see  Patient advised to go to a nonweightbearing status return in 6 weeks for x-ray

## 2013-04-19 NOTE — Patient Instructions (Signed)
No weight bearing x 6 weeks

## 2013-06-01 ENCOUNTER — Ambulatory Visit (INDEPENDENT_AMBULATORY_CARE_PROVIDER_SITE_OTHER): Payer: BC Managed Care – PPO | Admitting: Orthopedic Surgery

## 2013-06-01 DIAGNOSIS — M84376K Stress fracture, unspecified foot, subsequent encounter for fracture with nonunion: Secondary | ICD-10-CM

## 2013-06-01 DIAGNOSIS — IMO0002 Reserved for concepts with insufficient information to code with codable children: Secondary | ICD-10-CM

## 2013-06-01 NOTE — Patient Instructions (Addendum)
Apply short leg cast Monday am

## 2013-06-02 ENCOUNTER — Encounter: Payer: Self-pay | Admitting: Orthopedic Surgery

## 2013-06-02 DIAGNOSIS — M84376K Stress fracture, unspecified foot, subsequent encounter for fracture with nonunion: Secondary | ICD-10-CM | POA: Insufficient documentation

## 2013-06-02 NOTE — Progress Notes (Signed)
Patient ID: Doris Davis, female   DOB: 1970/10/09, 42 y.o.   MRN: [id number]  Chief Complaint  Patient presents with  . Follow-up    Stress fracture right foot    Followup is status post treatment for stress fracture right foot with nonweightbearing with crutches. The patient has not been compliant. She was pivoting on her foot felt a pop and increased pain and swelling after a period of quite essence.  At this point she will have a cast applied on Monday.  Today's examination reveals well-developed well-nourished female grooming and hygiene intact mood and affect normal she was ambulating weightbearing which is against our advice with her crutches. She is swelling in the second metatarsal area with dorsal greater than plantar pain no pain at the previous surgical site for bunionectomy  Recommend cast will be applied on Monday morning of next week

## 2013-06-07 ENCOUNTER — Other Ambulatory Visit: Payer: Self-pay | Admitting: *Deleted

## 2013-06-07 ENCOUNTER — Encounter: Payer: Self-pay | Admitting: Orthopedic Surgery

## 2013-06-07 ENCOUNTER — Ambulatory Visit (INDEPENDENT_AMBULATORY_CARE_PROVIDER_SITE_OTHER): Payer: BC Managed Care – PPO | Admitting: Orthopedic Surgery

## 2013-06-07 VITALS — BP 125/76 | Ht 63.0 in | Wt 167.0 lb

## 2013-06-07 DIAGNOSIS — IMO0002 Reserved for concepts with insufficient information to code with codable children: Secondary | ICD-10-CM

## 2013-06-07 DIAGNOSIS — M84371K Stress fracture, right ankle, subsequent encounter for fracture with nonunion: Secondary | ICD-10-CM

## 2013-06-07 DIAGNOSIS — M84374K Stress fracture, right foot, subsequent encounter for fracture with nonunion: Secondary | ICD-10-CM

## 2013-06-07 NOTE — Progress Notes (Signed)
Patient ID: Doris Davis, female   DOB: Dec 01, 1970, 42 y.o.   MRN: [id number]  Chief Complaint  Patient presents with  . Follow-up    Recheck right foot. cast right foot.    Persistent pain right foot MRI and diagnosed wrist fracture patient came in for application of nonweightbearing short leg cast which was applied.  Cast off in 6 weeks reexamine foot

## 2013-06-07 NOTE — Patient Instructions (Signed)
NO WEIGHT BEARING  

## 2013-06-08 ENCOUNTER — Telehealth: Payer: Self-pay | Admitting: Orthopedic Surgery

## 2013-06-08 NOTE — Telephone Encounter (Signed)
Faxed diagnosis code to [place].

## 2013-06-08 NOTE — Telephone Encounter (Signed)
Tammy from [place] called with question regarding wheelchair, states needs diagnosis for insurance billing - please fax to her direct fax# [phone]; her phone # if questions, [phone].

## 2013-06-16 ENCOUNTER — Telehealth: Payer: Self-pay | Admitting: Orthopedic Surgery

## 2013-06-16 NOTE — Telephone Encounter (Signed)
Herbert Deaner asked it it is possible for her to go back to the walking boot and get the cast off and come off the crutches.  She is having a very difficult time with her transportation to and from work. Please advise  Her # [phone]

## 2013-06-16 NOTE — Telephone Encounter (Signed)
Patient could not come in Monday. States she will call us back next week to try to schedule another appointment. Appointment was cancelled in [facility].

## 2013-06-16 NOTE — Telephone Encounter (Signed)
Appointment scheduled for 06/21/13, patient aware

## 2013-06-16 NOTE — Telephone Encounter (Signed)
Her choice.  

## 2013-06-21 ENCOUNTER — Ambulatory Visit: Payer: BC Managed Care – PPO | Admitting: Orthopedic Surgery

## 2013-06-23 ENCOUNTER — Ambulatory Visit (INDEPENDENT_AMBULATORY_CARE_PROVIDER_SITE_OTHER): Payer: BC Managed Care – PPO | Admitting: Orthopedic Surgery

## 2013-06-23 VITALS — BP 127/91 | Ht 63.0 in | Wt 167.0 lb

## 2013-06-23 DIAGNOSIS — M84371D Stress fracture, right ankle, subsequent encounter for fracture with routine healing: Secondary | ICD-10-CM

## 2013-06-23 DIAGNOSIS — Z4789 Encounter for other orthopedic aftercare: Secondary | ICD-10-CM

## 2013-06-23 NOTE — Patient Instructions (Signed)
Non weight bearing

## 2013-06-23 NOTE — Progress Notes (Signed)
Patient ID: Doris Davis, female   DOB: 11/17/1970, 42 y.o.   MRN: [id number]  Chief Complaint  Patient presents with  . Cast Problem    Cast got wet    Stress fracture or fracture care since August 11 diagnosed by MRI  Cast got wet not doing well in cast cast bothering foot removed has returned to continue nonweightbearing keep appointment as scheduled

## 2013-07-10 HISTORY — PX: BUNIONECTOMY: SHX129

## 2013-07-13 ENCOUNTER — Other Ambulatory Visit (HOSPITAL_COMMUNITY): Payer: Self-pay | Admitting: Internal Medicine

## 2013-07-13 ENCOUNTER — Other Ambulatory Visit: Payer: Self-pay | Admitting: Internal Medicine

## 2013-07-13 DIAGNOSIS — M8580 Other specified disorders of bone density and structure, unspecified site: Secondary | ICD-10-CM

## 2013-07-15 ENCOUNTER — Other Ambulatory Visit (HOSPITAL_COMMUNITY): Payer: BC Managed Care – PPO

## 2013-07-16 ENCOUNTER — Ambulatory Visit (HOSPITAL_COMMUNITY)
Admission: RE | Admit: 2013-07-16 | Discharge: 2013-07-16 | Disposition: A | Discharge status: Home / Self Care | Payer: BC Managed Care – PPO | Source: Ambulatory Visit | Attending: Internal Medicine | Admitting: Internal Medicine

## 2013-07-16 DIAGNOSIS — Z780 Asymptomatic menopausal state: Secondary | ICD-10-CM | POA: Insufficient documentation

## 2013-07-16 DIAGNOSIS — M8580 Other specified disorders of bone density and structure, unspecified site: Secondary | ICD-10-CM

## 2013-07-20 ENCOUNTER — Ambulatory Visit: Payer: BC Managed Care – PPO | Admitting: Orthopedic Surgery

## 2013-07-29 ENCOUNTER — Ambulatory Visit: Payer: BC Managed Care – PPO | Admitting: Orthopedic Surgery

## 2013-08-03 ENCOUNTER — Ambulatory Visit (INDEPENDENT_AMBULATORY_CARE_PROVIDER_SITE_OTHER): Payer: BC Managed Care – PPO | Admitting: Orthopedic Surgery

## 2013-08-03 VITALS — BP 140/100 | Ht 63.0 in | Wt 167.0 lb

## 2013-08-03 DIAGNOSIS — IMO0002 Reserved for concepts with insufficient information to code with codable children: Secondary | ICD-10-CM

## 2013-08-03 DIAGNOSIS — M79671 Pain in right foot: Secondary | ICD-10-CM

## 2013-08-03 DIAGNOSIS — M79609 Pain in unspecified limb: Secondary | ICD-10-CM

## 2013-08-03 DIAGNOSIS — M84374K Stress fracture, right foot, subsequent encounter for fracture with nonunion: Secondary | ICD-10-CM

## 2013-08-03 NOTE — Patient Instructions (Signed)
Referral to Dr Victorino Dike Foot pain after bunion with stress fracture

## 2013-08-04 ENCOUNTER — Encounter: Payer: Self-pay | Admitting: Orthopedic Surgery

## 2013-08-04 ENCOUNTER — Telehealth: Payer: Self-pay | Admitting: *Deleted

## 2013-08-04 ENCOUNTER — Other Ambulatory Visit: Payer: Self-pay | Admitting: *Deleted

## 2013-08-04 DIAGNOSIS — M84374K Stress fracture, right foot, subsequent encounter for fracture with nonunion: Secondary | ICD-10-CM

## 2013-08-04 NOTE — Progress Notes (Signed)
Patient ID: Doris Davis, female   DOB: November 07, 1970, 42 y.o.   MRN: [id number]  Chief Complaint  Patient presents with  . Follow-up    6 week recheck right foot fracture DOI 04/19/13   BP 140/100  Ht 5\' 3"  (1.6 m)  Wt 167 lb (75.751 kg)  BMI 29.59 kg/m2  LMP 07/05/2011  Encounter Diagnoses  Name Primary?  . Right foot pain Yes  . Stress fracture of foot with nonunion, right     This patient has had a bunionectomy by podiatrist developed pain in the second metatarsal area was eventually diagnosed with a stress fracture with MRI imaging. She was treated in a cast as well as a Cam Walker she could not tolerate either. She presents back now with continued pain in the dorsum of her second metatarsal and in the interspace between the first and second metatarsal. She's tried anti-inflammatories and Norco for pain she continues with symptoms and is really not a.  Her exam shows well-developed well-nourished female grooming and hygiene are intact she is oriented x3 mood is pleasant gait is normal she has tenderness in the first and second interspaces is normal range of motion of her ankle with painful range of motion the second metatarsophalangeal joint most of the pain is in between the second and first metatarsal scans intact there is no atrophy there is no numbness or tingling she has a good pulse  At this plan nothing else to offer her. She would like to see a foot and ankle surgeon for further opinion and evaluation and treatment and I agree with that.

## 2013-08-04 NOTE — Telephone Encounter (Signed)
Referral and office notes faxed to [facility]. Awaiting appointment.

## 2013-08-09 ENCOUNTER — Encounter: Payer: Self-pay | Admitting: Adult Health

## 2013-08-09 ENCOUNTER — Ambulatory Visit (INDEPENDENT_AMBULATORY_CARE_PROVIDER_SITE_OTHER): Payer: BC Managed Care – PPO | Admitting: Adult Health

## 2013-08-09 VITALS — BP 142/100 | Ht 63.0 in | Wt 168.0 lb

## 2013-08-09 DIAGNOSIS — N939 Abnormal uterine and vaginal bleeding, unspecified: Secondary | ICD-10-CM

## 2013-08-09 DIAGNOSIS — I10 Essential (primary) hypertension: Secondary | ICD-10-CM

## 2013-08-09 DIAGNOSIS — B9689 Other specified bacterial agents as the cause of diseases classified elsewhere: Secondary | ICD-10-CM

## 2013-08-09 DIAGNOSIS — N898 Other specified noninflammatory disorders of vagina: Secondary | ICD-10-CM

## 2013-08-09 DIAGNOSIS — N760 Acute vaginitis: Secondary | ICD-10-CM

## 2013-08-09 DIAGNOSIS — A499 Bacterial infection, unspecified: Secondary | ICD-10-CM

## 2013-08-09 HISTORY — DX: Abnormal uterine and vaginal bleeding, unspecified: N93.9

## 2013-08-09 HISTORY — DX: Other specified bacterial agents as the cause of diseases classified elsewhere: B96.89

## 2013-08-09 LAB — POCT WET PREP (WET MOUNT): Trichomonas Wet Prep HPF POC: NEGATIVE

## 2013-08-09 MED ORDER — METRONIDAZOLE 0.75 % VA GEL: VAGINAL | Status: DC

## 2013-08-09 NOTE — Progress Notes (Signed)
Subjective:     Patient ID: Doris Davis, female   DOB: [birth year], 42 y.o.   MRN: [id number]  HPI Doris Davis is a 42 year old white female in new relationship and has had vaginal bleeding after sex.She had a supracervical hysterectomy in 2012 and it has been at least 6 months since she had sex.Has some discomfort with some positions because of partner size.  Review of Systems See HPI Reviewed past medical,surgical, social and family history. Reviewed medications and allergies.     Objective:   Physical Exam BP 142/100  Ht 5\' 3"  (1.6 m)  Wt 168 lb (76.204 kg)  BMI 29.77 kg/m2  LMP 07/05/2011   Skin warm and dry.Pelvic: external genitalia is normal in appearance, vagina: pink discharge with slight odor, cervix:smooth, uterus: absent, adnexa: no masses or tenderness noted. Wet prep: + for clue cells and +WBCs.  Assessment:     Vaginal bleeding after sex BV Hypertension    Plan:     Rx metrogel at hs at 5 nights in vagina Discussed changing positions with sex, try astroglide or olive oil in crease fore play Follow up prn Sees Dr Margo Aye in am about meds and BP

## 2013-08-09 NOTE — Patient Instructions (Signed)
Use metrogel Try astro glide.olive oil Change positions

## 2013-11-16 ENCOUNTER — Other Ambulatory Visit (HOSPITAL_COMMUNITY): Payer: Self-pay | Admitting: Internal Medicine

## 2013-11-16 DIAGNOSIS — Z1231 Encounter for screening mammogram for malignant neoplasm of breast: Secondary | ICD-10-CM

## 2013-11-22 ENCOUNTER — Ambulatory Visit (HOSPITAL_COMMUNITY): Payer: BC Managed Care – PPO

## 2013-12-06 ENCOUNTER — Ambulatory Visit (HOSPITAL_COMMUNITY)
Admission: RE | Admit: 2013-12-06 | Discharge: 2013-12-06 | Disposition: A | Discharge status: Home / Self Care | Payer: BC Managed Care – PPO | Source: Ambulatory Visit | Attending: Internal Medicine | Admitting: Internal Medicine

## 2013-12-06 DIAGNOSIS — Z1231 Encounter for screening mammogram for malignant neoplasm of breast: Secondary | ICD-10-CM

## 2014-02-10 ENCOUNTER — Telehealth: Payer: Self-pay | Admitting: Orthopedic Surgery

## 2014-02-10 NOTE — Telephone Encounter (Signed)
Patient came in and RE-requested a referral to [facility], patient has new [employer] card on file. Please advise. Ph [phone] for patient. Patient had a name change, previous name was Herbert Deaner.

## 2014-02-16 NOTE — Telephone Encounter (Signed)
Left message for patient to contact [facility], and if they should need anything from [place], they can contact our office.

## 2014-02-21 NOTE — Telephone Encounter (Signed)
Patient called back today to relay that she did call [facility] to reschedule her referral appt. However they no longer have the notes. Please resend referral information. Patient does have new insurance, copy is in nurses box. Patient phone is [phone].

## 2014-02-24 NOTE — Telephone Encounter (Signed)
Re-faxed office notes and [facility].

## 2014-03-15 NOTE — Telephone Encounter (Signed)
Patient has an appointment scheduled for 04/2014 [facility].

## 2014-03-24 ENCOUNTER — Other Ambulatory Visit (HOSPITAL_COMMUNITY): Payer: Self-pay

## 2014-03-24 DIAGNOSIS — R0602 Shortness of breath: Secondary | ICD-10-CM

## 2014-03-26 ENCOUNTER — Emergency Department (HOSPITAL_COMMUNITY): Payer: BC Managed Care – PPO

## 2014-03-26 ENCOUNTER — Emergency Department (HOSPITAL_COMMUNITY)
Admission: EM | Admit: 2014-03-26 | Discharge: 2014-03-26 | Disposition: A | Discharge status: Home / Self Care | Payer: BC Managed Care – PPO | Attending: Emergency Medicine | Admitting: Emergency Medicine

## 2014-03-26 ENCOUNTER — Encounter (HOSPITAL_COMMUNITY): Payer: Self-pay | Admitting: Emergency Medicine

## 2014-03-26 DIAGNOSIS — Z87891 Personal history of nicotine dependence: Secondary | ICD-10-CM | POA: Insufficient documentation

## 2014-03-26 DIAGNOSIS — Z8742 Personal history of other diseases of the female genital tract: Secondary | ICD-10-CM | POA: Insufficient documentation

## 2014-03-26 DIAGNOSIS — I10 Essential (primary) hypertension: Secondary | ICD-10-CM | POA: Insufficient documentation

## 2014-03-26 DIAGNOSIS — J45909 Unspecified asthma, uncomplicated: Secondary | ICD-10-CM | POA: Insufficient documentation

## 2014-03-26 DIAGNOSIS — H538 Other visual disturbances: Secondary | ICD-10-CM | POA: Insufficient documentation

## 2014-03-26 DIAGNOSIS — H53149 Visual discomfort, unspecified: Secondary | ICD-10-CM | POA: Insufficient documentation

## 2014-03-26 DIAGNOSIS — Z79899 Other long term (current) drug therapy: Secondary | ICD-10-CM | POA: Insufficient documentation

## 2014-03-26 DIAGNOSIS — H9209 Otalgia, unspecified ear: Secondary | ICD-10-CM | POA: Insufficient documentation

## 2014-03-26 DIAGNOSIS — Z791 Long term (current) use of non-steroidal anti-inflammatories (NSAID): Secondary | ICD-10-CM | POA: Insufficient documentation

## 2014-03-26 DIAGNOSIS — G4485 Primary stabbing headache: Secondary | ICD-10-CM | POA: Insufficient documentation

## 2014-03-26 DIAGNOSIS — R110 Nausea: Secondary | ICD-10-CM | POA: Insufficient documentation

## 2014-03-26 DIAGNOSIS — Z880 Allergy status to penicillin: Secondary | ICD-10-CM | POA: Insufficient documentation

## 2014-03-26 DIAGNOSIS — Z8619 Personal history of other infectious and parasitic diseases: Secondary | ICD-10-CM | POA: Insufficient documentation

## 2014-03-26 DIAGNOSIS — R4789 Other speech disturbances: Secondary | ICD-10-CM | POA: Insufficient documentation

## 2014-03-26 LAB — CBC WITH DIFFERENTIAL/PLATELET
BASOS PCT: 0 % (ref 0–1)
Basophils Absolute: 0 10*3/uL (ref 0.0–0.1)
EOS ABS: 0.2 10*3/uL (ref 0.0–0.7)
Eosinophils Relative: 3 % (ref 0–5)
HCT: 37.4 % (ref 36.0–46.0)
HEMOGLOBIN: 12.5 g/dL (ref 12.0–15.0)
Lymphocytes Relative: 25 % (ref 12–46)
Lymphs Abs: 2 10*3/uL (ref 0.7–4.0)
MCH: 28.9 pg (ref 26.0–34.0)
MCHC: 33.4 g/dL (ref 30.0–36.0)
MCV: 86.6 fL (ref 78.0–100.0)
Monocytes Absolute: 0.5 10*3/uL (ref 0.1–1.0)
Monocytes Relative: 6 % (ref 3–12)
NEUTROS PCT: 66 % (ref 43–77)
Neutro Abs: 5.3 10*3/uL (ref 1.7–7.7)
Platelets: 233 10*3/uL (ref 150–400)
RBC: 4.32 MIL/uL (ref 3.87–5.11)
RDW: 13.3 % (ref 11.5–15.5)
WBC: 8 10*3/uL (ref 4.0–10.5)

## 2014-03-26 LAB — BASIC METABOLIC PANEL
Anion gap: 10 (ref 5–15)
BUN: 9 mg/dL (ref 6–23)
CO2: 24 mEq/L (ref 19–32)
CREATININE: 0.6 mg/dL (ref 0.50–1.10)
Calcium: 9.5 mg/dL (ref 8.4–10.5)
Chloride: 104 mEq/L (ref 96–112)
Glucose, Bld: 92 mg/dL (ref 70–99)
POTASSIUM: 4.3 meq/L (ref 3.7–5.3)
Sodium: 138 mEq/L (ref 137–147)

## 2014-03-26 MED ORDER — DIPHENHYDRAMINE HCL 50 MG/ML IJ SOLN
25.0000 mg | Freq: Once | INTRAMUSCULAR | Status: AC
  Administered 2014-03-26: 25 mg via INTRAVENOUS
  Filled 2014-03-26: qty 1

## 2014-03-26 MED ORDER — BUTALBITAL-APAP-CAFFEINE 50-325-40 MG PO TABS: 1.0000 | ORAL_TABLET | ORAL | Status: DC | PRN

## 2014-03-26 MED ORDER — METOCLOPRAMIDE HCL 5 MG/ML IJ SOLN
10.0000 mg | Freq: Once | INTRAMUSCULAR | Status: AC
  Administered 2014-03-26: 10 mg via INTRAVENOUS
  Filled 2014-03-26: qty 2

## 2014-03-26 MED ORDER — KETOROLAC TROMETHAMINE 30 MG/ML IJ SOLN
30.0000 mg | Freq: Once | INTRAMUSCULAR | Status: AC
  Administered 2014-03-26: 30 mg via INTRAVENOUS
  Filled 2014-03-26: qty 1

## 2014-03-26 MED ORDER — SODIUM CHLORIDE 0.9 % IV SOLN: Freq: Once | INTRAVENOUS | Status: DC

## 2014-03-26 NOTE — ED Notes (Signed)
PA @ bedside.

## 2014-03-26 NOTE — ED Notes (Signed)
Pt states pain to right side of her head x 3 weeks. Denies N/V. States blurred vision to right eye x 2 days. Difficulty hearing from right ear also x 2 days.

## 2014-03-26 NOTE — ED Provider Notes (Signed)
CSN: [id number]     Arrival date & time 03/26/14  0941 History   First MD Initiated Contact with Patient 03/26/14 [phone]     Chief Complaint  Patient presents with  . Headache     (Consider location/radiation/quality/duration/timing/severity/associated sxs/prior Treatment) Patient is a 43 y.o. female presenting with headaches. The history is provided by the patient.  Headache Pain location:  R parietal Quality:  Stabbing Radiates to: right ear. Severity currently:  5/10 Severity at highest:  10/10 Onset quality:  Gradual Duration:  3 weeks Timing:  Constant Progression:  Waxing and waning Chronicity:  New Similar to prior headaches: no   Context: bright light, loud noise and straining   Relieved by:  Nothing Worsened by:  Activity, light and neck movement Ineffective treatments:  NSAIDs, resting in a darkened room and acetaminophen Associated symptoms: blurred vision, ear pain, nausea, photophobia and visual change   Associated symptoms: no abdominal pain, no back pain, no congestion, no cough, no diarrhea, no dizziness, no pain, no facial pain, no fever, no focal weakness, no loss of balance, no near-syncope, no neck pain, no neck stiffness, no numbness, no paresthesias, no seizures, no sinus pressure, no swollen glands, no syncope, no tingling, no vomiting and no weakness   Associated symptoms comment:  Right ear pain and diminished hearing Risk factors: no family hx of SAH     West PughDonna Balazs is a 43 y.o. female who presents to the Emergency Department complaining of right-sided headache for 3 weeks. She reports the headache is waxing and waning he describes a sharp stabbing sensation to the right parietal region. She reports blurred vision to her right eye for 2 days and associated pain behind her right ear for 2 days as well. She denies fever, vomiting, neck stiffness, chest pain or shortness of breath. Patient's husband reports noticing difficulty with her speech. She denies ataxia.   Patient reports that she was seen by her primary care doctor, Dr. Margo AyeHall, earlier this week and had blood work and a chest x-ray and she was advised if the headache worsened to come to the emergency department for evaluation.  Past Medical History  Diagnosis Date  . Hypertension   . PONV (postoperative nausea and vomiting)     sts with ablation she was given meds for nausea and "got sicker".  . Asthma     with contact with cats  . Venereal disease 1990    chlamydia  . Allergy   . Vaginal bleeding 08/09/2013    Had supra cervical hyst 2012, has had bleeding after sex  . BV (bacterial vaginosis) 08/09/2013   Past Surgical History  Procedure Laterality Date  . Fracture surgery  2005    left ankle-[place]  . Endometrial ablation  2010    [facility]  . Cesarean section  1995  . Laparoscopic supracervical hysterectomy  07/17/2011    Procedure: LAPAROSCOPIC SUPRACERVICAL HYSTERECTOMY;  Surgeon: Lazaro ArmsLuther H Eure, MD;  Location: [facility];  Service: Gynecology;  Laterality: N/A;  . Bunionectomy    . Abdominal hysterectomy     Family History  Problem Relation Age of Onset  . Anesthesia problems Neg Hx   . Malignant hyperthermia Neg Hx   . Hypotension Neg Hx   . Pseudochol deficiency Neg Hx   . Coronary artery disease Mother   . Hypertension Mother   . Stroke Mother   . Diabetes Paternal Grandmother    History  Substance Use Topics  . Smoking status: Former Smoker -- 0.50 packs/day for  20 years    Types: Cigarettes    Quit date: 12/25/2013  . Smokeless tobacco: Never Used  . Alcohol Use: Yes     Comment: occ   OB History   Grav Para Term Preterm Abortions TAB SAB Ect Mult Living   2 1   1  1         Review of Systems  Constitutional: Negative for fever, activity change and appetite change.  HENT: Positive for ear pain. Negative for congestion, ear discharge, facial swelling, sinus pressure and trouble swallowing.   Eyes: Positive for blurred vision, photophobia and visual disturbance.  Negative for pain.  Respiratory: Negative for cough, chest tightness and shortness of breath.   Cardiovascular: Negative for chest pain, syncope and near-syncope.  Gastrointestinal: Positive for nausea. Negative for vomiting, abdominal pain and diarrhea.  Genitourinary: Negative for dysuria and flank pain.  Musculoskeletal: Negative for back pain, neck pain and neck stiffness.  Skin: Negative for rash and wound.  Neurological: Positive for speech difficulty and headaches. Negative for dizziness, focal weakness, seizures, syncope, facial asymmetry, weakness, numbness, paresthesias and loss of balance.  Psychiatric/Behavioral: Negative for confusion and decreased concentration.  All other systems reviewed and are negative.     Allergies  Coconut oil and Penicillins  Home Medications   Prior to Admission medications   Medication Sig Start Date End Date Taking? Authorizing Provider  ALPRAZolam Prudy Feeler) 0.5 MG tablet Take 0.5 mg by mouth daily as needed. 03/24/14  Yes Historical Provider, MD  amphetamine-dextroamphetamine (ADDERALL) 10 MG tablet Take 10 mg by mouth daily as needed.    Yes Historical Provider, MD  buPROPion (WELLBUTRIN XL) 150 MG 24 hr tablet Take 1 tablet by mouth daily. 03/15/14  Yes Historical Provider, MD  diclofenac (VOLTAREN) 75 MG EC tablet Take 75 mg by mouth daily. 03/24/14  Yes Historical Provider, MD  losartan (COZAAR) 100 MG tablet Take 1 tablet by mouth daily. 03/24/14  Yes Historical Provider, MD  enalapril (VASOTEC) 10 MG tablet Take 20 mg by mouth daily.     Historical Provider, MD  Probiotic Product (ALIGN PO) Take 1 tablet by mouth every morning. OTC med to help w/ IBS     Historical Provider, MD   BP 138/81  Pulse 87  Temp(Src) 98.8 F (37.1 C) (Oral)  Resp 16  Ht 5\' 3"  (1.6 m)  Wt 170 lb (77.111 kg)  BMI 30.12 kg/m2  SpO2 98%  LMP 07/05/2011 Physical Exam  Nursing note and vitals reviewed. Constitutional: She is oriented to person, place, and time.  She appears well-developed and well-nourished. No distress.  HENT:  Head: Normocephalic and atraumatic.  Mouth/Throat: Oropharynx is clear and moist.  Eyes: EOM are normal. Pupils are equal, round, and reactive to light.  Neck: Normal range of motion and phonation normal. Neck supple. No spinous process tenderness and no muscular tenderness present. No rigidity. No Brudzinski's sign and no Kernig's sign noted.  Cardiovascular: Normal rate, regular rhythm, normal heart sounds and intact distal pulses.   No murmur heard. Pulmonary/Chest: Effort normal and breath sounds normal. No respiratory distress.  Abdominal: Soft. She exhibits no distension. There is no tenderness. There is no rebound and no guarding.  Musculoskeletal: Normal range of motion.  Neurological: She is alert and oriented to person, place, and time. She has normal strength. No cranial nerve deficit or sensory deficit. She exhibits normal muscle tone. Coordination and gait normal. GCS eye subscore is 4. GCS verbal subscore is 5. GCS motor subscore is 6.  Reflex Scores:      Tricep reflexes are 2+ on the right side and 2+ on the left side.      Bicep reflexes are 2+ on the right side and 2+ on the left side. Grip strength is slightly diminished on the right. No pronator drift. No obvious dysarthria. No facial droop.  Skin: Skin is warm and dry.  Psychiatric: She has a normal mood and affect.    ED Course  Procedures (including critical care time) Labs Review Labs Reviewed  CBC WITH DIFFERENTIAL  BASIC METABOLIC PANEL    Imaging Review Ct Head Wo Contrast  03/26/2014   CLINICAL DATA:  Severe right-sided headache.  EXAM: CT HEAD WITHOUT CONTRAST  TECHNIQUE: Contiguous axial images were obtained from the base of the skull through the vertex without contrast.  COMPARISON:  None  FINDINGS: Normal appearance of the intracranial structures. No evidence for acute hemorrhage, mass lesion, midline shift, hydrocephalus or large  infarct. No acute bony abnormality. Mild ethmoid sinus mucosal thickening. Remaining visualized sinuses are essentially clear.  IMPRESSION: No intracranial abnormality.   Electronically Signed   By: Amie Portland M.D.   On: 03/26/2014 11:21     EKG Interpretation None      MDM   Final diagnoses:  Primary stabbing headache    Pt is feeling better after medications.  Sitting upright on stretcher talking with spouse.    Pt also evaluated by Dr. Rosalia Hammers.    1215  Labs and CT results discussed with the patient.  She is well appearing, non-toxic, no meningeal signs or h/o fever.  Her stroke score today is 0 but I have discussed the option of sending her to [facility] to have MRI brain.  She declines and prefers to have f/u with her PMD on Monday.   I have also advised her to return for any worsening or changing symptoms such as fever, unilateral weakness or facial droop.  She verbalized understanding and agrees to plan.  Ambulates with steady gait.  She appears stable for d/c       Giana Castner L. Trisha Mangle, PA-C 03/27/14 1841

## 2014-03-28 NOTE — ED Provider Notes (Signed)
History/physical exam/procedure(s) were performed by non-physician practitioner and as supervising physician I was immediately available for consultation/collaboration. I have reviewed all notes and am in agreement with care and plan.   Sharol Croghan S Marcey Persad, MD 03/28/14 1052 

## 2014-04-05 ENCOUNTER — Ambulatory Visit (HOSPITAL_COMMUNITY)
Admission: RE | Admit: 2014-04-05 | Discharge: 2014-04-05 | Disposition: A | Discharge status: Home / Self Care | Payer: BC Managed Care – PPO | Source: Ambulatory Visit | Attending: Internal Medicine | Admitting: Internal Medicine

## 2014-04-05 ENCOUNTER — Other Ambulatory Visit (HOSPITAL_COMMUNITY): Payer: Self-pay | Admitting: Internal Medicine

## 2014-04-05 DIAGNOSIS — F17200 Nicotine dependence, unspecified, uncomplicated: Secondary | ICD-10-CM | POA: Insufficient documentation

## 2014-04-05 DIAGNOSIS — R0602 Shortness of breath: Secondary | ICD-10-CM | POA: Insufficient documentation

## 2014-04-05 DIAGNOSIS — R05 Cough: Secondary | ICD-10-CM | POA: Insufficient documentation

## 2014-04-05 DIAGNOSIS — R059 Cough, unspecified: Secondary | ICD-10-CM | POA: Insufficient documentation

## 2014-04-05 MED ORDER — ALBUTEROL SULFATE (2.5 MG/3ML) 0.083% IN NEBU
2.5000 mg | INHALATION_SOLUTION | Freq: Once | RESPIRATORY_TRACT | Status: AC
  Administered 2014-04-05: 2.5 mg via RESPIRATORY_TRACT

## 2014-04-18 LAB — PULMONARY FUNCTION TEST
DL/VA % pred: 94 %
DL/VA: 4.54 ml/min/mmHg/L
DLCO COR: 21.89 ml/min/mmHg
DLCO cor % pred: 90 %
DLCO unc % pred: 90 %
DLCO unc: 21.89 ml/min/mmHg
FEF 25-75 PRE: 3.93 L/s
FEF 25-75 Post: 4.3 L/sec
FEF2575-%CHANGE-POST: 9 %
FEF2575-%Pred-Post: 146 %
FEF2575-%Pred-Pre: 133 %
FEV1-%CHANGE-POST: 1 %
FEV1-%PRED-POST: 110 %
FEV1-%Pred-Pre: 108 %
FEV1-POST: 3.22 L
FEV1-Pre: 3.16 L
FEV1FVC-%CHANGE-POST: 2 %
FEV1FVC-%PRED-PRE: 105 %
FEV6-%Change-Post: 0 %
FEV6-%PRED-PRE: 104 %
FEV6-%Pred-Post: 103 %
FEV6-POST: 3.66 L
FEV6-Pre: 3.69 L
FEV6FVC-%PRED-POST: 102 %
FEV6FVC-%PRED-PRE: 102 %
FVC-%Change-Post: 0 %
FVC-%PRED-PRE: 101 %
FVC-%Pred-Post: 101 %
FVC-PRE: 3.69 L
FVC-Post: 3.66 L
PRE FEV6/FVC RATIO: 100 %
Post FEV1/FVC ratio: 88 %
Post FEV6/FVC ratio: 100 %
Pre FEV1/FVC ratio: 86 %
RV % PRED: 99 %
RV: 1.68 L
TLC % PRED: 104 %
TLC: 5.26 L

## 2014-07-10 HISTORY — PX: FOOT ARTHROPLASTY: SHX1657

## 2014-07-11 ENCOUNTER — Encounter (HOSPITAL_COMMUNITY): Payer: Self-pay | Admitting: Emergency Medicine

## 2014-11-24 ENCOUNTER — Encounter (HOSPITAL_BASED_OUTPATIENT_CLINIC_OR_DEPARTMENT_OTHER): Payer: Self-pay | Admitting: *Deleted

## 2014-11-24 NOTE — Progress Notes (Signed)
To go to [facility] for ekg-bmet-had had several ft surgeries Still anxious

## 2014-11-28 ENCOUNTER — Encounter (HOSPITAL_COMMUNITY)
Admission: RE | Admit: 2014-11-28 | Discharge: 2014-11-28 | Disposition: A | Discharge status: Home / Self Care | Payer: 59 | Source: Ambulatory Visit | Attending: Orthopedic Surgery | Admitting: Orthopedic Surgery

## 2014-11-28 ENCOUNTER — Other Ambulatory Visit: Payer: Self-pay

## 2014-11-28 DIAGNOSIS — Z888 Allergy status to other drugs, medicaments and biological substances status: Secondary | ICD-10-CM | POA: Diagnosis not present

## 2014-11-28 DIAGNOSIS — J45909 Unspecified asthma, uncomplicated: Secondary | ICD-10-CM | POA: Diagnosis not present

## 2014-11-28 DIAGNOSIS — Z880 Allergy status to penicillin: Secondary | ICD-10-CM | POA: Diagnosis not present

## 2014-11-28 DIAGNOSIS — Z90710 Acquired absence of both cervix and uterus: Secondary | ICD-10-CM | POA: Diagnosis not present

## 2014-11-28 DIAGNOSIS — I10 Essential (primary) hypertension: Secondary | ICD-10-CM | POA: Diagnosis not present

## 2014-11-28 DIAGNOSIS — F17210 Nicotine dependence, cigarettes, uncomplicated: Secondary | ICD-10-CM | POA: Diagnosis not present

## 2014-11-28 DIAGNOSIS — M1990 Unspecified osteoarthritis, unspecified site: Secondary | ICD-10-CM | POA: Diagnosis not present

## 2014-11-28 DIAGNOSIS — M2041 Other hammer toe(s) (acquired), right foot: Secondary | ICD-10-CM | POA: Diagnosis present

## 2014-11-28 LAB — BASIC METABOLIC PANEL
Anion gap: 7 (ref 5–15)
BUN: 9 mg/dL (ref 6–23)
CO2: 27 mmol/L (ref 19–32)
Calcium: 9.1 mg/dL (ref 8.4–10.5)
Chloride: 103 mmol/L (ref 96–112)
Creatinine, Ser: 0.63 mg/dL (ref 0.50–1.10)
GFR calc Af Amer: >90 mL/min (ref >90)
GFR calc non Af Amer: >90 mL/min (ref >90)
Glucose, Bld: 92 mg/dL (ref 70–99)
Potassium: 3.7 mmol/L (ref 3.5–5.1)
Sodium: 137 mmol/L (ref 135–145)

## 2014-12-01 ENCOUNTER — Ambulatory Visit (HOSPITAL_BASED_OUTPATIENT_CLINIC_OR_DEPARTMENT_OTHER): Payer: 59 | Admitting: Anesthesiology

## 2014-12-01 ENCOUNTER — Encounter (HOSPITAL_BASED_OUTPATIENT_CLINIC_OR_DEPARTMENT_OTHER)
Admission: RE | Disposition: A | Discharge status: Home / Self Care | Payer: Self-pay | Source: Ambulatory Visit | Attending: Orthopedic Surgery

## 2014-12-01 ENCOUNTER — Ambulatory Visit (HOSPITAL_BASED_OUTPATIENT_CLINIC_OR_DEPARTMENT_OTHER)
Admission: RE | Admit: 2014-12-01 | Discharge: 2014-12-01 | Disposition: A | Discharge status: Home / Self Care | Payer: 59 | Source: Ambulatory Visit | Attending: Orthopedic Surgery | Admitting: Orthopedic Surgery

## 2014-12-01 ENCOUNTER — Encounter (HOSPITAL_BASED_OUTPATIENT_CLINIC_OR_DEPARTMENT_OTHER): Payer: Self-pay | Admitting: Anesthesiology

## 2014-12-01 DIAGNOSIS — J45909 Unspecified asthma, uncomplicated: Secondary | ICD-10-CM | POA: Insufficient documentation

## 2014-12-01 DIAGNOSIS — M2041 Other hammer toe(s) (acquired), right foot: Secondary | ICD-10-CM | POA: Insufficient documentation

## 2014-12-01 DIAGNOSIS — Z880 Allergy status to penicillin: Secondary | ICD-10-CM | POA: Insufficient documentation

## 2014-12-01 DIAGNOSIS — M1990 Unspecified osteoarthritis, unspecified site: Secondary | ICD-10-CM | POA: Insufficient documentation

## 2014-12-01 DIAGNOSIS — Z90710 Acquired absence of both cervix and uterus: Secondary | ICD-10-CM | POA: Insufficient documentation

## 2014-12-01 DIAGNOSIS — Z888 Allergy status to other drugs, medicaments and biological substances status: Secondary | ICD-10-CM | POA: Insufficient documentation

## 2014-12-01 DIAGNOSIS — F17210 Nicotine dependence, cigarettes, uncomplicated: Secondary | ICD-10-CM | POA: Insufficient documentation

## 2014-12-01 DIAGNOSIS — I10 Essential (primary) hypertension: Secondary | ICD-10-CM | POA: Insufficient documentation

## 2014-12-01 HISTORY — PX: HAMMER TOE SURGERY: SHX385

## 2014-12-01 HISTORY — DX: Presence of spectacles and contact lenses: Z97.3

## 2014-12-01 HISTORY — DX: Unspecified osteoarthritis, unspecified site: M19.90

## 2014-12-01 HISTORY — PX: REMOVAL OF IMPLANT: SHX6451

## 2014-12-01 LAB — POCT HEMOGLOBIN-HEMACUE: Hemoglobin: 11.4 g/dL — ABNORMAL LOW (ref 12.0–15.0)

## 2014-12-01 SURGERY — REMOVAL OF IMPLANT
Anesthesia: General | Laterality: Right

## 2014-12-01 MED ORDER — LACTATED RINGERS IV SOLN
INTRAVENOUS | Status: DC
  Administered 2014-12-01: 11:00:00 via INTRAVENOUS

## 2014-12-01 MED ORDER — VANCOMYCIN HCL IN DEXTROSE 1-5 GM/200ML-% IV SOLN
1000.0000 mg | INTRAVENOUS | Status: AC
  Administered 2014-12-01: 1000 mg via INTRAVENOUS

## 2014-12-01 MED ORDER — SCOPOLAMINE 1 MG/3DAYS TD PT72
1.0000 | MEDICATED_PATCH | TRANSDERMAL | Status: DC
  Administered 2014-12-01: 1.5 mg via TRANSDERMAL

## 2014-12-01 MED ORDER — ONDANSETRON HCL 4 MG/2ML IJ SOLN
INTRAMUSCULAR | Status: DC | PRN
  Administered 2014-12-01: 4 mg via INTRAVENOUS

## 2014-12-01 MED ORDER — FENTANYL CITRATE 0.05 MG/ML IJ SOLN
INTRAMUSCULAR | Status: AC
  Filled 2014-12-01: qty 4

## 2014-12-01 MED ORDER — BUPIVACAINE HCL (PF) 0.5 % IJ SOLN
INTRAMUSCULAR | Status: AC
  Filled 2014-12-01: qty 30

## 2014-12-01 MED ORDER — MIDAZOLAM HCL 2 MG/2ML IJ SOLN: 1.0000 mg | INTRAMUSCULAR | Status: DC | PRN

## 2014-12-01 MED ORDER — FENTANYL CITRATE 0.05 MG/ML IJ SOLN
INTRAMUSCULAR | Status: DC | PRN
  Administered 2014-12-01 (×2): 50 ug via INTRAVENOUS
  Administered 2014-12-01: 100 ug via INTRAVENOUS

## 2014-12-01 MED ORDER — FENTANYL CITRATE 0.05 MG/ML IJ SOLN: 50.0000 ug | INTRAMUSCULAR | Status: DC | PRN

## 2014-12-01 MED ORDER — VANCOMYCIN HCL IN DEXTROSE 1-5 GM/200ML-% IV SOLN
INTRAVENOUS | Status: AC
  Filled 2014-12-01: qty 200

## 2014-12-01 MED ORDER — LIDOCAINE HCL (CARDIAC) 20 MG/ML IV SOLN
INTRAVENOUS | Status: DC | PRN
  Administered 2014-12-01: 80 mg via INTRAVENOUS

## 2014-12-01 MED ORDER — CHLORHEXIDINE GLUCONATE 4 % EX LIQD: 60.0000 mL | Freq: Once | CUTANEOUS | Status: DC

## 2014-12-01 MED ORDER — HYDROMORPHONE HCL 1 MG/ML IJ SOLN
0.2500 mg | INTRAMUSCULAR | Status: DC | PRN
  Administered 2014-12-01: 0.5 mg via INTRAVENOUS

## 2014-12-01 MED ORDER — HYDROMORPHONE HCL 1 MG/ML IJ SOLN
INTRAMUSCULAR | Status: AC
  Filled 2014-12-01: qty 1

## 2014-12-01 MED ORDER — MIDAZOLAM HCL 2 MG/2ML IJ SOLN
INTRAMUSCULAR | Status: AC
  Filled 2014-12-01: qty 2

## 2014-12-01 MED ORDER — PROMETHAZINE HCL 25 MG/ML IJ SOLN
6.2500 mg | INTRAMUSCULAR | Status: DC | PRN
Start: 2014-12-01 — End: 2014-12-01

## 2014-12-01 MED ORDER — 0.9 % SODIUM CHLORIDE (POUR BTL) OPTIME
TOPICAL | Status: DC | PRN
  Administered 2014-12-01: 1000 mL

## 2014-12-01 MED ORDER — SCOPOLAMINE 1 MG/3DAYS TD PT72
MEDICATED_PATCH | TRANSDERMAL | Status: AC
  Filled 2014-12-01: qty 1

## 2014-12-01 MED ORDER — PROPOFOL 10 MG/ML IV BOLUS
INTRAVENOUS | Status: DC | PRN
  Administered 2014-12-01: 50 mg via INTRAVENOUS
  Administered 2014-12-01: 200 mg via INTRAVENOUS

## 2014-12-01 MED ORDER — BUPIVACAINE HCL (PF) 0.5 % IJ SOLN
INTRAMUSCULAR | Status: DC | PRN
  Administered 2014-12-01: 6 mL

## 2014-12-01 MED ORDER — HYDROCODONE-ACETAMINOPHEN 5-325 MG PO TABS: 1.0000 | ORAL_TABLET | Freq: Four times a day (QID) | ORAL | Status: DC | PRN

## 2014-12-01 MED ORDER — MIDAZOLAM HCL 5 MG/5ML IJ SOLN
INTRAMUSCULAR | Status: DC | PRN
  Administered 2014-12-01: 2 mg via INTRAVENOUS

## 2014-12-01 MED ORDER — DEXAMETHASONE SODIUM PHOSPHATE 10 MG/ML IJ SOLN
INTRAMUSCULAR | Status: DC | PRN
  Administered 2014-12-01: 10 mg via INTRAVENOUS

## 2014-12-01 MED ORDER — SODIUM CHLORIDE 0.9 % IV SOLN
INTRAVENOUS | Status: DC
Start: 2014-12-01 — End: 2014-12-01

## 2014-12-01 SURGICAL SUPPLY — 80 items
BAG DECANTER FOR FLEXI CONT (MISCELLANEOUS) IMPLANT
BANDAGE ELASTIC 4 VELCRO ST LF (GAUZE/BANDAGES/DRESSINGS) IMPLANT
BANDAGE ESMARK 6X9 LF (GAUZE/BANDAGES/DRESSINGS) ×1 IMPLANT
BANDAGE GAUZE 4  KLING STR (GAUZE/BANDAGES/DRESSINGS) IMPLANT
BLADE AVERAGE 25X9 (BLADE) IMPLANT
BLADE OSC/SAG .038X5.5 CUT EDG (BLADE) ×1 IMPLANT
BLADE SURG 15 STRL LF DISP TIS (BLADE) ×2 IMPLANT
BLADE SURG 15 STRL SS (BLADE) ×4
BNDG CMPR 9X4 STRL LF SNTH (GAUZE/BANDAGES/DRESSINGS)
BNDG CMPR 9X6 STRL LF SNTH (GAUZE/BANDAGES/DRESSINGS) ×1
BNDG COHESIVE 4X5 TAN STRL (GAUZE/BANDAGES/DRESSINGS) ×2 IMPLANT
BNDG COHESIVE 6X5 TAN STRL LF (GAUZE/BANDAGES/DRESSINGS) IMPLANT
BNDG CONFORM 2 STRL LF (GAUZE/BANDAGES/DRESSINGS) IMPLANT
BNDG CONFORM 3 STRL LF (GAUZE/BANDAGES/DRESSINGS) ×2 IMPLANT
BNDG ESMARK 4X9 LF (GAUZE/BANDAGES/DRESSINGS) IMPLANT
BNDG ESMARK 6X9 LF (GAUZE/BANDAGES/DRESSINGS) ×2
CAP PIN PROTECTOR ORTHO WHT (CAP) IMPLANT
CHLORAPREP W/TINT 26ML (MISCELLANEOUS) ×2 IMPLANT
COVER BACK TABLE 60X90IN (DRAPES) ×2 IMPLANT
CUFF TOURNIQUET SINGLE 34IN LL (TOURNIQUET CUFF) ×1 IMPLANT
DECANTER SPIKE VIAL GLASS SM (MISCELLANEOUS) IMPLANT
DRAPE EXTREMITY T 121X128X90 (DRAPE) ×2 IMPLANT
DRAPE OEC MINIVIEW 54X84 (DRAPES) ×2 IMPLANT
DRAPE SURG 17X23 STRL (DRAPES) IMPLANT
DRAPE U-SHAPE 47X51 STRL (DRAPES) ×2 IMPLANT
DRSG EMULSION OIL 3X3 NADH (GAUZE/BANDAGES/DRESSINGS) ×2 IMPLANT
DRSG PAD ABDOMINAL 8X10 ST (GAUZE/BANDAGES/DRESSINGS) IMPLANT
DRSG TEGADERM 4X4.75 (GAUZE/BANDAGES/DRESSINGS) IMPLANT
ELECT REM PT RETURN 9FT ADLT (ELECTROSURGICAL)
ELECTRODE REM PT RTRN 9FT ADLT (ELECTROSURGICAL) ×1 IMPLANT
GAUZE SPONGE 4X4 12PLY STRL (GAUZE/BANDAGES/DRESSINGS) ×2 IMPLANT
GLOVE BIO SURGEON STRL SZ7.5 (GLOVE) ×1 IMPLANT
GLOVE BIO SURGEON STRL SZ8 (GLOVE) ×2 IMPLANT
GLOVE BIOGEL PI IND STRL 7.5 (GLOVE) IMPLANT
GLOVE BIOGEL PI IND STRL 8 (GLOVE) ×1 IMPLANT
GLOVE BIOGEL PI INDICATOR 7.5 (GLOVE) ×2
GLOVE BIOGEL PI INDICATOR 8 (GLOVE) ×1
GLOVE EXAM NITRILE MD LF STRL (GLOVE) IMPLANT
GLOVE SURG SS PI 7.0 STRL IVOR (GLOVE) ×1 IMPLANT
GOWN STRL REUS W/ TWL LRG LVL3 (GOWN DISPOSABLE) ×1 IMPLANT
GOWN STRL REUS W/ TWL XL LVL3 (GOWN DISPOSABLE) ×1 IMPLANT
GOWN STRL REUS W/TWL LRG LVL3 (GOWN DISPOSABLE) ×2
GOWN STRL REUS W/TWL XL LVL3 (GOWN DISPOSABLE) ×2
GUIDEWIRE ORTHO 062 (WIRE) ×1 IMPLANT
K-WIRE .054X4 (WIRE) IMPLANT
NDL HYPO 25X1 1.5 SAFETY (NEEDLE) IMPLANT
NEEDLE HYPO 22GX1.5 SAFETY (NEEDLE) IMPLANT
NEEDLE HYPO 25X1 1.5 SAFETY (NEEDLE) ×2 IMPLANT
NS IRRIG 1000ML POUR BTL (IV SOLUTION) ×2 IMPLANT
PACK BASIN DAY SURGERY FS (CUSTOM PROCEDURE TRAY) ×2 IMPLANT
PAD CAST 4YDX4 CTTN HI CHSV (CAST SUPPLIES) ×1 IMPLANT
PADDING CAST ABS 4INX4YD NS (CAST SUPPLIES)
PADDING CAST ABS COTTON 4X4 ST (CAST SUPPLIES) IMPLANT
PADDING CAST COTTON 4X4 STRL (CAST SUPPLIES) ×2
PADDING CAST COTTON 6X4 STRL (CAST SUPPLIES) IMPLANT
PENCIL BUTTON HOLSTER BLD 10FT (ELECTRODE) ×1 IMPLANT
SANITIZER HAND PURELL 535ML FO (MISCELLANEOUS) ×2 IMPLANT
SCREW FUSION ACUTRAK 30 3000 (Screw) IMPLANT
SCREW FUSION ACUTRAK 30MM 3000 (Screw) ×2 IMPLANT
SHEET MEDIUM DRAPE 40X70 STRL (DRAPES) ×2 IMPLANT
SLEEVE SCD COMPRESS KNEE MED (MISCELLANEOUS) ×2 IMPLANT
SPLINT FAST PLASTER 5X30 (CAST SUPPLIES)
SPLINT PLASTER CAST FAST 5X30 (CAST SUPPLIES) IMPLANT
SPONGE LAP 18X18 X RAY DECT (DISPOSABLE) ×2 IMPLANT
STAPLER VISISTAT 35W (STAPLE) IMPLANT
STOCKINETTE 6  STRL (DRAPES) ×1
STOCKINETTE 6 STRL (DRAPES) ×1 IMPLANT
STRIP CLOSURE SKIN 1/2X4 (GAUZE/BANDAGES/DRESSINGS) IMPLANT
SUCTION FRAZIER TIP 10 FR DISP (SUCTIONS) IMPLANT
SUT ETHILON 3 0 PS 1 (SUTURE) ×2 IMPLANT
SUT MNCRL AB 3-0 PS2 18 (SUTURE) ×2 IMPLANT
SUT VIC AB 0 SH 27 (SUTURE) IMPLANT
SUT VIC AB 2-0 SH 27 (SUTURE)
SUT VIC AB 2-0 SH 27XBRD (SUTURE) IMPLANT
SUT VICRYL 4-0 PS2 18IN ABS (SUTURE) IMPLANT
SYR BULB 3OZ (MISCELLANEOUS) ×2 IMPLANT
SYR CONTROL 10ML LL (SYRINGE) ×2 IMPLANT
TOWEL OR 17X24 6PK STRL BLUE (TOWEL DISPOSABLE) ×4 IMPLANT
TUBE CONNECTING 20X1/4 (TUBING) IMPLANT
UNDERPAD 30X30 INCONTINENT (UNDERPADS AND DIAPERS) ×2 IMPLANT

## 2014-12-01 NOTE — Brief Op Note (Signed)
12/01/2014  12:29 PM  PATIENT:  Doris Davis  [age] y.o. female  PRE-OPERATIVE DIAGNOSIS:  Right third toe recurrent hammertoe deformity and painful hardware  POST-OPERATIVE DIAGNOSIS:  same  Procedure(s): 1.  REMOVAL OF  DEEP IMPLANT FROM RIGHT 3RD TOE 2.  REVISION RIGHT 3RD HAMMER TOE CORRECTION 3.  Right foot AP and lateral xray  SURGEON:  Toni ArthursJohn Tavi Gaughran, MD  ASSISTANT: n/a  ANESTHESIA:   General, regional  EBL:  minimal   TOURNIQUET:   Total Tourniquet Time Documented: Calf (Right) - 17 minutes Total: Calf (Right) - 17 minutes  COMPLICATIONS:  None apparent  DISPOSITION:  Extubated, awake and stable to recovery.  DICTATION ID:  [id number]

## 2014-12-01 NOTE — H&P (Signed)
Doris Davis is an 44 y.o. female.   Chief Complaint: right foot pain HPI: 44 y/o female with h/o right foot bunion and hammertoe corrections back in November of 2015.  She has had continued pain at the third toe, and xrays show a dislocated PIP joint over an implant.  She presents now for revision hammertoe correction with removal of the dislocated implant.  Past Medical History  Diagnosis Date  . Hypertension   . PONV (postoperative nausea and vomiting)     sts with ablation she was given meds for nausea and "got sicker".  . Asthma     with contact with cats  . Venereal disease 1990    chlamydia  . Allergy   . Vaginal bleeding 08/09/2013    Had supra cervical hyst 2012, has had bleeding after sex  . BV (bacterial vaginosis) 08/09/2013  . Wears glasses   . Arthritis     Past Surgical History  Procedure Laterality Date  . Fracture surgery  2005    left ankle-[place]  . Endometrial ablation  2010    [facility]  . Cesarean section  1995  . Laparoscopic supracervical hysterectomy  07/17/2011    Procedure: LAPAROSCOPIC SUPRACERVICAL HYSTERECTOMY;  Surgeon: Doris ArmsLuther H Eure, MD;  Location: [facility];  Service: Gynecology;  Laterality: N/A;  . Bunionectomy  11/14    rt  . Abdominal hysterectomy    . Foot arthroplasty  11/15    rt-revision bunionectomy-    Family History  Problem Relation Age of Onset  . Anesthesia problems Neg Hx   . Malignant hyperthermia Neg Hx   . Hypotension Neg Hx   . Pseudochol deficiency Neg Hx   . Coronary artery disease Mother   . Hypertension Mother   . Stroke Mother   . Diabetes Paternal Grandmother    Social History:  reports that she has been smoking Cigarettes.  She has a 5 pack-year smoking history. She has never used smokeless tobacco. She reports that she drinks alcohol. She reports that she does not use illicit drugs.  Allergies:  Allergies  Allergen Reactions  . Coconut Oil Anaphylaxis  . Penicillins Anaphylaxis    No prescriptions prior to  admission    No results found for this or any previous visit (from the past 48 hour(s)). No results found.  ROS  No recent f/c/n/v/wt loss  Height 5\' 3"  (1.6 m), weight 77.111 kg (170 lb), last menstrual period 07/05/2011. Physical Exam  wn wd woman in nad.  A and Ox 4.  Mood and affect normal.  EOMI.  resp unlabored.  R foot third toe with swelling and flexion at the PIP joint.  Skin healthy and intact.  No lymphadenopathy.  Sens to LT intact.  5/5 strength in PF and DF of the toes.  Assessment/Plan R 3rd toe dislocated PIP joint - to OR for removal of the painful hardware and revision of the hammertoe correction.  The risks and benefits of the alternative treatment options have been discussed in detail.  The patient wishes to proceed with surgery and specifically understands risks of bleeding, infection, nerve damage, blood clots, need for additional surgery, amputation and death.   Doris Davis, Doris Davis 12/01/2014, 8:54 AM

## 2014-12-01 NOTE — Op Note (Signed)
NAMWest Pugh:  Doris Davis, Doris Davis                ACCOUNT NO.:  [id number]  MEDICAL RECORD NO.:  [id number]  LOCATION:                               FACILITY:  [facility]  PHYSICIAN:  Toni ArthursJohn Caelynn Marshman, MD        DATE OF BIRTH:  [birth year]  DATE OF PROCEDURE:  12/01/2014 DATE OF DISCHARGE:  12/01/2014                              OPERATIVE REPORT   PREOPERATIVE DIAGNOSIS:  Right third toe recurrent hammertoe deformity with painful hardware.  POSTOPERATIVE DIAGNOSIS:  Right third toe recurrent hammertoe deformity with painful hardware.  PROCEDURE: 1. Removal of deep implant from the right third toe. 2. Revision of right third hammertoe correction (proximal     interphalangeal joint arthrodesis). 3. Right foot AP and lateral radiographs.  SURGEON:  Toni ArthursJohn Bransyn Adami, MD  ANESTHESIA:  General, regional.  ESTIMATED BLOOD LOSS:  Minimal.  TOURNIQUET TIME:  17 minutes with an ankle Esmarch.  COMPLICATIONS:  None apparent.  DISPOSITION:  Extubated awake and stable to recovery.  INDICATIONS FOR PROCEDURE:  The patient is a 44 year old woman who underwent hallux rigidus and hammertoe corrections late last year.  She injured her foot in the early postoperative period.  Followup radiographs showed recurrent hammertoe deformity on the third toe with dislocation of the PIP joint implant.  She presents now for removal of this painful hardware and revision of the PIP joint arthrodesis to correct the hammertoe deformity.  She understands the risks and benefits, the alternative treatment options, and elects surgical treatment.  She specifically understands risks of bleeding, infection, nerve damage, blood clots, need for additional surgery, continued pain, amputation, and death.  PROCEDURE IN DETAIL:  After preoperative consent was obtained and the correct operative site was identified, the patient was brought to the operating room and placed supine on the operating table.  General anesthesia was induced.   Preoperative antibiotics were administered. Surgical time-out was taken.  The right lower extremity was prepped and draped in standard sterile fashion.  Foot was exsanguinated and an Esmarch tourniquet was wrapped around the ankle.  The patient's previous dorsal incision was identified on the third toe.  The incision was made again, sharp dissection was carried down through the skin and subcutaneous tissue.  The PIP joint was identified.  The implant was noted to be dislocated from the middle phalanx, but well-fixed within the proximal phalanx.  Sharp dissection was carried down around the distal end of the implant that was cleared of all soft tissues.  A needle driver was used to remove it in its entirety.  The PIP joint was then cleared of all fibrous tissue.  The middle and distal phalanges were drilled with a 0.0625 K-wire.  Stab incision was made at the tip of the toe and Acutrak screw was inserted from the tip of the toe across the DIP and PIP joints until it was well seated within the proximal phalanx compressing the PIP joint appropriately.  The DIP joint was noted to move appropriately.  AP and lateral radiographs confirmed appropriate reduction of the PIP joint and correction of the hammertoe deformity.  The wound was irrigated copiously and closed with horizontal mattress sutures of 3-0 nylon.  Sterile  dressings were applied followed by a compression wrap.  Tourniquet was released at 17 minutes.  The patient was awakened from anesthesia and transported to the recovery room in stable condition.  FOLLOWUP PLAN:  The patient will be weightbearing as tolerated on the right foot in a flat postop shoe.  She will follow up with me in 2 weeks for suture removal.  RADIOGRAPHS:  AP and lateral radiographs of the right foot were obtained intraoperatively today.  These show interval revision of the PIP joint arthrodesis and appropriate correction of the hammertoe deformity.  No other  acute injuries noted.  The remaining hardware is appropriately positioned.     Toni Arthurs, MD     JH/MEDQ  D:  12/01/2014  T:  12/01/2014  Job:  [id number]

## 2014-12-01 NOTE — Discharge Instructions (Signed)
Doris ArthursJohn Hewitt, MD [facility]  Please read the following information regarding your care after surgery.  Medications  You only need a prescription for the narcotic pain medicine (ex. oxycodone, Percocet, Norco).  All of the other medicines listed below are available over the counter. X norco as prescribed for severe pain. X ibuprofen or naproxen as needed for moderate pain  Narcotic pain medicine (ex. oxycodone, Percocet, Vicodin) will cause constipation.  To prevent this problem, take the following medicines while you are taking any pain medicine. X docusate sodium (Colace) 100 mg twice a day X senna (Senokot) 2 tablets twice a day  Weight Bearing ? Bear weight when you are able on your operated leg or foot. X Bear weight on your operated foot in the post-op shoe. ? Do not bear any weight on the operated leg or foot.  Cast / Splint / Dressing X Keep your dressing clean and dry.  Dont put anything (coat hanger, pencil, etc) down inside of it.  If it gets damp, use a hair dryer on the cool setting to dry it.  If it gets soaked, call the office to schedule an appointment for a cast change. ? Remove your dressing 3 days after surgery and cover the incisions with dry dressings.    After your dressing, cast or splint is removed; you may shower, but do not soak or scrub the wound.  Allow the water to run over it, and then gently pat it dry.  Swelling It is normal for you to have swelling where you had surgery.  To reduce swelling and pain, keep your toes above your nose for at least 3 days after surgery.  It may be necessary to keep your foot or leg elevated for several weeks.  If it hurts, it should be elevated.  Follow Up Call my office at [phone] when you are discharged from the hospital or surgery center to schedule an appointment to be seen two weeks after surgery.  Call my office at [phone] if you develop a fever >101.5 F, nausea, vomiting, bleeding from the  surgical site or severe pain.    Post Anesthesia Home Care Instructions  Activity: Get plenty of rest for the remainder of the day. A responsible adult should stay with you for 24 hours following the procedure.  For the next 24 hours, DO NOT: -Drive a car -Advertising copywriterperate machinery -Drink alcoholic beverages -Take any medication unless instructed by your physician -Make any legal decisions or sign important papers.  Meals: Start with liquid foods such as gelatin or soup. Progress to regular foods as tolerated. Avoid greasy, spicy, heavy foods. If nausea and/or vomiting occur, drink only clear liquids until the nausea and/or vomiting subsides. Call your physician if vomiting continues.  Special Instructions/Symptoms: Your throat may feel dry or sore from the anesthesia or the breathing tube placed in your throat during surgery. If this causes discomfort, gargle with warm salt water. The discomfort should disappear within 24 hours.

## 2014-12-01 NOTE — Anesthesia Procedure Notes (Signed)
Procedure Name: LMA Insertion Date/Time: 12/01/2014 11:48 AM Performed by: Burna CashONRAD, Dairon Procter C Pre-anesthesia Checklist: Patient identified, Emergency Drugs available, Suction available and Patient being monitored Patient Re-evaluated:Patient Re-evaluated prior to inductionOxygen Delivery Method: Circle System Utilized Preoxygenation: Pre-oxygenation with 100% oxygen Intubation Type: IV induction Ventilation: Mask ventilation without difficulty LMA: LMA inserted LMA Size: 4.0 Number of attempts: 1 Airway Equipment and Method: Bite block Placement Confirmation: positive ETCO2 Tube secured with: Tape Dental Injury: Teeth and Oropharynx as per pre-operative assessment

## 2014-12-01 NOTE — Anesthesia Postprocedure Evaluation (Signed)
  Anesthesia Post-op Note  Patient: Doris Davis  Procedure(s) Performed: Procedure(s): REMOVAL OF  DEEP IMPLANT FROM RIGHT 3RD TOE (Right) REVISION OF RIGHT 3RD HAMMER TOE CORRECTION (Right)  Patient Location: PACU  Anesthesia Type:General  Level of Consciousness: awake  Airway and Oxygen Therapy: Patient Spontanous Breathing  Post-op Pain: mild  Post-op Assessment: Post-op Vital signs reviewed  Post-op Vital Signs: Reviewed  Last Vitals:  Filed Vitals:   12/01/14 1237  BP:   Pulse: 91  Temp:   Resp: 17    Complications: No apparent anesthesia complications

## 2014-12-01 NOTE — Anesthesia Preprocedure Evaluation (Signed)
Anesthesia Evaluation  Patient identified by MRN, date of birth, ID band Patient awake    History of Anesthesia Complications (+) PONV  Airway Mallampati: II  TM Distance: >3 FB Neck ROM: Full    Dental   Pulmonary asthma , Current Smoker,  breath sounds clear to auscultation        Cardiovascular hypertension, Rhythm:Regular Rate:Normal     Neuro/Psych    GI/Hepatic negative GI ROS, Neg liver ROS,   Endo/Other  negative endocrine ROS  Renal/GU      Musculoskeletal   Abdominal   Peds  Hematology   Anesthesia Other Findings   Reproductive/Obstetrics                             Anesthesia Physical Anesthesia Plan  ASA: II  Anesthesia Plan: General   Post-op Pain Management:    Induction:   Airway Management Planned: Oral ETT  Additional Equipment:   Intra-op Plan:   Post-operative Plan: Extubation in OR  Informed Consent: I have reviewed the patients History and Physical, chart, labs and discussed the procedure including the risks, benefits and alternatives for the proposed anesthesia with the patient or authorized representative who has indicated his/her understanding and acceptance.   Dental advisory given  Plan Discussed with: CRNA and Anesthesiologist  Anesthesia Plan Comments:         Anesthesia Quick Evaluation

## 2014-12-01 NOTE — Transfer of Care (Signed)
Immediate Anesthesia Transfer of Care Note  Patient: Doris PughDonna Cowans  Procedure(s) Performed: Procedure(s): REMOVAL OF  DEEP IMPLANT FROM RIGHT 3RD TOE (Right) REVISION OF RIGHT 3RD HAMMER TOE CORRECTION (Right)  Patient Location: PACU  Anesthesia Type:General  Level of Consciousness: awake, sedated and responds to stimulation  Airway & Oxygen Therapy: Patient Spontanous Breathing and Patient connected to face mask oxygen  Post-op Assessment: Report given to RN, Post -op Vital signs reviewed and stable and Patient moving all extremities  Post vital signs: Reviewed and stable  Last Vitals:  Filed Vitals:   12/01/14 1024  BP: 126/86  Pulse: 88  Temp: 36.6 C  Resp: 18    Complications: No apparent anesthesia complications

## 2014-12-06 ENCOUNTER — Encounter (HOSPITAL_BASED_OUTPATIENT_CLINIC_OR_DEPARTMENT_OTHER): Payer: Self-pay | Admitting: Orthopedic Surgery

## 2014-12-06 NOTE — Addendum Note (Signed)
Addendum  created 12/06/14 [id number] by Lance CoonWesley Meara Wiechman, CRNA   Modules edited: Charges VN

## 2015-06-20 ENCOUNTER — Encounter (HOSPITAL_COMMUNITY): Payer: Self-pay | Admitting: Emergency Medicine

## 2015-06-20 ENCOUNTER — Emergency Department (HOSPITAL_COMMUNITY)
Admission: EM | Admit: 2015-06-20 | Discharge: 2015-06-20 | Disposition: A | Discharge status: Home / Self Care | Payer: 59 | Attending: Emergency Medicine | Admitting: Emergency Medicine

## 2015-06-20 ENCOUNTER — Emergency Department (HOSPITAL_COMMUNITY): Payer: 59

## 2015-06-20 DIAGNOSIS — I10 Essential (primary) hypertension: Secondary | ICD-10-CM | POA: Diagnosis not present

## 2015-06-20 DIAGNOSIS — Z880 Allergy status to penicillin: Secondary | ICD-10-CM | POA: Insufficient documentation

## 2015-06-20 DIAGNOSIS — R42 Dizziness and giddiness: Secondary | ICD-10-CM | POA: Insufficient documentation

## 2015-06-20 DIAGNOSIS — M1990 Unspecified osteoarthritis, unspecified site: Secondary | ICD-10-CM | POA: Insufficient documentation

## 2015-06-20 DIAGNOSIS — R55 Syncope and collapse: Secondary | ICD-10-CM | POA: Diagnosis not present

## 2015-06-20 DIAGNOSIS — R4702 Dysphasia: Secondary | ICD-10-CM | POA: Diagnosis not present

## 2015-06-20 DIAGNOSIS — R079 Chest pain, unspecified: Secondary | ICD-10-CM | POA: Insufficient documentation

## 2015-06-20 DIAGNOSIS — Z973 Presence of spectacles and contact lenses: Secondary | ICD-10-CM | POA: Insufficient documentation

## 2015-06-20 DIAGNOSIS — Z79899 Other long term (current) drug therapy: Secondary | ICD-10-CM | POA: Insufficient documentation

## 2015-06-20 DIAGNOSIS — Z720 Tobacco use: Secondary | ICD-10-CM | POA: Diagnosis not present

## 2015-06-20 DIAGNOSIS — Z8742 Personal history of other diseases of the female genital tract: Secondary | ICD-10-CM | POA: Diagnosis not present

## 2015-06-20 DIAGNOSIS — Z8619 Personal history of other infectious and parasitic diseases: Secondary | ICD-10-CM | POA: Diagnosis not present

## 2015-06-20 DIAGNOSIS — J45909 Unspecified asthma, uncomplicated: Secondary | ICD-10-CM | POA: Diagnosis not present

## 2015-06-20 LAB — BASIC METABOLIC PANEL
Anion gap: 7 (ref 5–15)
BUN: 12 mg/dL (ref 6–20)
CALCIUM: 8.8 mg/dL — AB (ref 8.9–10.3)
CO2: 25 mmol/L (ref 22–32)
Chloride: 105 mmol/L (ref 101–111)
Creatinine, Ser: 0.7 mg/dL (ref 0.44–1.00)
GFR calc Af Amer: >60 mL/min (ref >60)
Glucose, Bld: 102 mg/dL — ABNORMAL HIGH (ref 65–99)
POTASSIUM: 3.8 mmol/L (ref 3.5–5.1)
SODIUM: 137 mmol/L (ref 135–145)

## 2015-06-20 LAB — URINALYSIS, ROUTINE W REFLEX MICROSCOPIC
BILIRUBIN URINE: NEGATIVE
Glucose, UA: NEGATIVE mg/dL
HGB URINE DIPSTICK: NEGATIVE
KETONES UR: NEGATIVE mg/dL
Leukocytes, UA: NEGATIVE
NITRITE: NEGATIVE
PROTEIN: NEGATIVE mg/dL
SPECIFIC GRAVITY, URINE: 1.015 (ref 1.005–1.030)
UROBILINOGEN UA: 0.2 mg/dL (ref 0.0–1.0)
pH: 6.5 (ref 5.0–8.0)

## 2015-06-20 LAB — CBC
HCT: 37.1 % (ref 36.0–46.0)
Hemoglobin: 12.1 g/dL (ref 12.0–15.0)
MCH: 28.7 pg (ref 26.0–34.0)
MCHC: 32.6 g/dL (ref 30.0–36.0)
MCV: 88.1 fL (ref 78.0–100.0)
PLATELETS: 230 10*3/uL (ref 150–400)
RBC: 4.21 MIL/uL (ref 3.87–5.11)
RDW: 13.8 % (ref 11.5–15.5)
WBC: 8.5 10*3/uL (ref 4.0–10.5)

## 2015-06-20 LAB — RAPID URINE DRUG SCREEN, HOSP PERFORMED
Amphetamines: NOT DETECTED
Barbiturates: NOT DETECTED
Benzodiazepines: NOT DETECTED
COCAINE: NOT DETECTED
OPIATES: NOT DETECTED
Tetrahydrocannabinol: NOT DETECTED

## 2015-06-20 NOTE — ED Notes (Signed)
Assisted pt with BSC. Urinated without difficulty. Output 250 ml

## 2015-06-20 NOTE — ED Provider Notes (Addendum)
CSN: [id number]     Arrival date & time 06/20/15  1203 History   First MD Initiated Contact with Patient 06/20/15 1213     Chief Complaint  Patient presents with  . Dizziness     (Consider location/radiation/quality/duration/timing/severity/associated sxs/prior Treatment) HPI...... history obtained from patient and her husband. Patient allegedly "passed out at work". She has felt intermittent dizziness for [redacted] weeks along with chest pain.  Blood pressure has been elevated. Husband reports some dysphasia this has improved. She has a history of agoraphobia. Patient recently placed on Celexa. Doses been decreased from 20 mg to 10 mg daily. No gross neurological deficits, stiff neck  Past Medical History  Diagnosis Date  . Hypertension   . PONV (postoperative nausea and vomiting)     sts with ablation she was given meds for nausea and "got sicker".  . Asthma     with contact with cats  . Venereal disease 1990    chlamydia  . Allergy   . Vaginal bleeding 08/09/2013    Had supra cervical hyst 2012, has had bleeding after sex  . BV (bacterial vaginosis) 08/09/2013  . Wears glasses   . Arthritis    Past Surgical History  Procedure Laterality Date  . Fracture surgery  2005    left ankle-[place]  . Endometrial ablation  2010    [facility]  . Cesarean section  1995  . Laparoscopic supracervical hysterectomy  07/17/2011    Procedure: LAPAROSCOPIC SUPRACERVICAL HYSTERECTOMY;  Surgeon: Lazaro Arms, MD;  Location: [facility];  Service: Gynecology;  Laterality: N/A;  . Bunionectomy  11/14    rt  . Abdominal hysterectomy    . Foot arthroplasty  11/15    rt-revision bunionectomy-  . Removal of implant Right 12/01/2014    Procedure: REMOVAL OF  DEEP IMPLANT FROM RIGHT 3RD TOE;  Surgeon: Toni Arthurs, MD;  Location: [facility];  Service: Orthopedics;  Laterality: Right;  . Hammer toe surgery Right 12/01/2014    Procedure: REVISION OF RIGHT 3RD HAMMER TOE CORRECTION;  Surgeon: Toni Arthurs,  MD;  Location: [facility];  Service: Orthopedics;  Laterality: Right;   Family History  Problem Relation Age of Onset  . Anesthesia problems Neg Hx   . Malignant hyperthermia Neg Hx   . Hypotension Neg Hx   . Pseudochol deficiency Neg Hx   . Coronary artery disease Mother   . Hypertension Mother   . Stroke Mother   . Diabetes Paternal Grandmother    Social History  Substance Use Topics  . Smoking status: Current Every Day Smoker -- 0.25 packs/day for 20 years    Types: Cigarettes    Last Attempt to Quit: 12/25/2013  . Smokeless tobacco: Never Used  . Alcohol Use: Yes     Comment: occ   OB History    Gravida Para Term Preterm AB TAB SAB Ectopic Multiple Living   2 1   1  1         Review of Systems  All other systems reviewed and are negative.     Allergies  Coconut oil and Penicillins  Home Medications   Prior to Admission medications   Medication Sig Start Date End Date Taking? Authorizing Provider  buPROPion (WELLBUTRIN XL) 150 MG 24 hr tablet Take 1 tablet by mouth daily. 03/15/14  Yes Historical Provider, MD  citalopram (CELEXA) 10 MG tablet Take 10 mg by mouth daily. 06/16/15  Yes Historical Provider, MD  loratadine (CLARITIN) 10 MG tablet Take 10  mg by mouth daily.   Yes Historical Provider, MD  losartan (COZAAR) 100 MG tablet Take 1 tablet by mouth daily. 03/24/14  Yes Historical Provider, MD  Probiotic Product (ALIGN PO) Take 1 tablet by mouth every morning. OTC med to help w/ IBS    Yes Historical Provider, MD  ALPRAZolam Prudy Feeler) 0.5 MG tablet Take 0.5 mg by mouth daily as needed for anxiety.  03/24/14   Historical Provider, MD  amphetamine-dextroamphetamine (ADDERALL) 10 MG tablet Take 10 mg by mouth daily as needed.     Historical Provider, MD  HYDROcodone-acetaminophen (NORCO) 5-325 MG per tablet Take 1-2 tablets by mouth every 6 (six) hours as needed. Patient not taking: Reported on 06/20/2015 12/01/14   Toni Arthurs, MD   BP 160/106 mmHg  Pulse  84  Temp(Src) 98 F (36.7 C) (Oral)  Resp 19  Ht  (1.6 m)  Wt 180 lb (81.647 kg)  BMI 31.89 kg/m2  SpO2 95%  LMP 07/05/2011 Physical Exam  Constitutional: She is oriented to person, place, and time. She appears well-developed and well-nourished.  HENT:  Head: Normocephalic and atraumatic.  Eyes: Conjunctivae and EOM are normal. Pupils are equal, round, and reactive to light.  Neck: Normal range of motion. Neck supple.  Cardiovascular: Normal rate and regular rhythm.   Pulmonary/Chest: Effort normal and breath sounds normal.  Abdominal: Soft. Bowel sounds are normal.  Musculoskeletal: Normal range of motion.  Neurological: She is alert and oriented to person, place, and time.  Skin: Skin is warm and dry.  Psychiatric: She has a normal mood and affect. Her behavior is normal.  Nursing note and vitals reviewed.   ED Course  Procedures (including critical care time) Labs Review Labs Reviewed  BASIC METABOLIC PANEL - Abnormal; Notable for the following:    Glucose, Bld 102 (*)    Calcium 8.8 (*)    All other components within normal limits  CBC  URINALYSIS, ROUTINE W REFLEX MICROSCOPIC (NOT AT [facility])  URINE RAPID DRUG SCREEN, HOSP PERFORMED    Imaging Review Ct Head Wo Contrast  06/20/2015  CLINICAL DATA:  Dizziness and intermittent confusion ; fall with transient loss of consciousness. EXAM: CT HEAD WITHOUT CONTRAST TECHNIQUE: Contiguous axial images were obtained from the base of the skull through the vertex without intravenous contrast. COMPARISON:  March 26, 2014 FINDINGS: The ventricles are normal in size and configuration. There is no intracranial mass, hemorrhage, extra-axial fluid collection, or midline shift. The gray-white compartments are normal. No acute infarct evident. The bony calvarium appears intact. The mastoid air cells are clear. There is patchy opacity in the ethmoid air cells bilaterally. There is mild mucosal thickening in portions of the right maxillary  antrum. IMPRESSION: Areas of paranasal sinus disease. No intracranial mass, hemorrhage, or extra-axial fluid collection. Gray-white compartments appear normal. No acute infarct evident. Electronically Signed   By: Bretta Bang III M.D.   On: 06/20/2015 14:15   I have personally reviewed and evaluated these images and lab results as part of my medical decision-making.   EKG Interpretation   Date/Time:  Tuesday June 20 2015 12:11:45 EDT Ventricular Rate:  84 PR Interval:  148 QRS Duration: 103 QT Interval:  388 QTC Calculation: 459 R Axis:   47 Text Interpretation:  Sinus rhythm Confirmed by Adriana Simas  MD, Mylasia Vorhees ([id number]) on  06/20/2015 2:12:57 PM      MDM   Final diagnoses:  Vertigo  Syncope, unspecified syncope type    Patient has a physical exam. Screening  labs, EKG, CT head all negative. She has primary care follow-up. I discussed the test findings with the patient and her husband. They agree with plan.    Donnetta Hutching, MD 06/22/15 1158  Donnetta Hutching, MD 06/22/15 1159

## 2015-06-20 NOTE — Discharge Instructions (Signed)
Stop the Celexa. See your primary care doctor. Tests today were reassuring.

## 2015-06-20 NOTE — ED Notes (Signed)
Patient with c/o dizziness that started today. Patient had LOC at work, passed out in chair and landed on floor. Un-witnessed. Patient woke up and found co-worker. States dizziness is intermittent, but is cyclic in nature. Patient has intermittent confusion. Repetitive questions. Hypertensive, PCP is watching, takes losartan. Patient started celexa last week and had dosage decreased this week due to patient not liking "how it felt".

## 2015-10-05 ENCOUNTER — Ambulatory Visit (INDEPENDENT_AMBULATORY_CARE_PROVIDER_SITE_OTHER): Payer: 59 | Admitting: Otolaryngology

## 2015-10-05 DIAGNOSIS — H9042 Sensorineural hearing loss, unilateral, left ear, with unrestricted hearing on the contralateral side: Secondary | ICD-10-CM

## 2015-10-05 DIAGNOSIS — H6983 Other specified disorders of Eustachian tube, bilateral: Secondary | ICD-10-CM | POA: Diagnosis not present

## 2015-10-26 ENCOUNTER — Ambulatory Visit (INDEPENDENT_AMBULATORY_CARE_PROVIDER_SITE_OTHER): Payer: 59 | Admitting: Otolaryngology

## 2016-03-18 ENCOUNTER — Ambulatory Visit (INDEPENDENT_AMBULATORY_CARE_PROVIDER_SITE_OTHER): Payer: Managed Care, Other (non HMO) | Admitting: Women's Health

## 2016-03-18 ENCOUNTER — Encounter: Payer: Self-pay | Admitting: Women's Health

## 2016-03-18 ENCOUNTER — Other Ambulatory Visit (HOSPITAL_COMMUNITY)
Admission: RE | Admit: 2016-03-18 | Discharge: 2016-03-18 | Disposition: A | Discharge status: Home / Self Care | Payer: Managed Care, Other (non HMO) | Source: Ambulatory Visit | Attending: Obstetrics & Gynecology | Admitting: Obstetrics & Gynecology

## 2016-03-18 VITALS — BP 120/80 | HR 72 | Ht 63.5 in | Wt 179.0 lb

## 2016-03-18 DIAGNOSIS — E669 Obesity, unspecified: Secondary | ICD-10-CM

## 2016-03-18 DIAGNOSIS — Z01419 Encounter for gynecological examination (general) (routine) without abnormal findings: Secondary | ICD-10-CM | POA: Insufficient documentation

## 2016-03-18 DIAGNOSIS — Z720 Tobacco use: Secondary | ICD-10-CM | POA: Diagnosis not present

## 2016-03-18 DIAGNOSIS — Z87891 Personal history of nicotine dependence: Secondary | ICD-10-CM | POA: Insufficient documentation

## 2016-03-18 DIAGNOSIS — Z01411 Encounter for gynecological examination (general) (routine) with abnormal findings: Secondary | ICD-10-CM

## 2016-03-18 DIAGNOSIS — Z1151 Encounter for screening for human papillomavirus (HPV): Secondary | ICD-10-CM | POA: Insufficient documentation

## 2016-03-18 DIAGNOSIS — F17200 Nicotine dependence, unspecified, uncomplicated: Secondary | ICD-10-CM | POA: Insufficient documentation

## 2016-03-18 DIAGNOSIS — E041 Nontoxic single thyroid nodule: Secondary | ICD-10-CM | POA: Diagnosis not present

## 2016-03-18 DIAGNOSIS — N951 Menopausal and female climacteric states: Secondary | ICD-10-CM

## 2016-03-18 NOTE — Progress Notes (Signed)
Patient ID: Doris Davis, female   DOB: [birth year], 45 y.o.   MRN: [id number] Subjective:   Doris PughDonna Mensing is a 45 y.o. [phone] Caucasian female here for a routine well-woman exam.  Patient's last menstrual period was 07/05/2011.    Current complaints: had some bleeding the other week x 1 day, none since. Increased anxiety- thinks she's going through menopause, family tells her she has mood swings, not a lot of vaginal dryness, always hot- so hasn't noticed any difference w/ hot flashes- declines labs.  Has joined a gym and started working out w/ her husband, plans to start eating better to help them both lose some weight.  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  Past Medical History Past Medical History  Diagnosis Date  . Hypertension   . PONV (postoperative nausea and vomiting)     sts with ablation she was given meds for nausea and "got sicker".  . Asthma     with contact with cats  . Venereal disease 1990    chlamydia  . Allergy   . Vaginal bleeding 08/09/2013    Had supra cervical hyst 2012, has had bleeding after sex  . BV (bacterial vaginosis) 08/09/2013  . Wears glasses   . Arthritis   . Anxiety     Past Surgical History Past Surgical History  Procedure Laterality Date  . Fracture surgery  2005    left ankle-[place]  . Endometrial ablation  2010    [facility]  . Cesarean section  1995  . Laparoscopic  supracervical hysterectomy  07/17/2011    Procedure: LAPAROSCOPIC SUPRACERVICAL HYSTERECTOMY;  Surgeon: Lazaro ArmsLuther H Eure, MD;  Location: [facility];  Service: Gynecology;  Laterality: N/A;  . Bunionectomy  11/14    rt  . Abdominal hysterectomy    . Foot arthroplasty  11/15    rt-revision bunionectomy-  . Removal of implant Right 12/01/2014    Procedure: REMOVAL OF  DEEP IMPLANT FROM RIGHT 3RD TOE;  Surgeon: Toni ArthursJohn Hewitt, MD;  Location: [facility];  Service: Orthopedics;  Laterality: Right;  . Hammer toe surgery Right 12/01/2014    Procedure: REVISION OF RIGHT 3RD HAMMER TOE CORRECTION;  Surgeon: Toni ArthursJohn Hewitt, MD;  Location: [facility];  Service: Orthopedics;  Laterality: Right;    Gynecologic History [id number]  Patient's last menstrual period was 07/05/2011. Contraception: status post hysterectomy Last Pap: 2012. Results were: normal Last mammogram: 8691yr ago. Results were: normal Last TCS: 6024yrs ago, normal  Obstetric History OB History  Gravida Para Term Preterm AB SAB TAB Ectopic Multiple Living  5 2 1 1 3 3    1     # Outcome Date GA Lbr Len/2nd Weight Sex Delivery Anes PTL Lv  5 SAB 1997        N  4 Term 1995 [id number]  10 lb 3 oz (4.621 kg) F CS-Unspec   Y  3 Preterm 1994 7474w0d   M Vag-Spont  Y ND  2 SAB  1 SAB               Current Medications Current Outpatient Prescriptions on File Prior to Visit  Medication Sig Dispense Refill  . ALPRAZolam (XANAX) 0.5 MG tablet Take 0.5 mg by mouth daily as needed for anxiety.     Marland Kitchen buPROPion (WELLBUTRIN XL) 150 MG 24 hr tablet Take 1 tablet by mouth daily.    Marland Kitchen losartan (COZAAR) 100 MG tablet Take 1 tablet by mouth daily.    . Probiotic Product (ALIGN PO) Take 1 tablet by mouth every morning. OTC med to help w/ IBS     . amphetamine-dextroamphetamine (ADDERALL) 10 MG tablet Take 10 mg by mouth daily as needed. Reported on 03/18/2016    . citalopram (CELEXA) 10 MG tablet Take 10 mg by mouth daily. Reported on  03/18/2016  0  . HYDROcodone-acetaminophen (NORCO) 5-325 MG per tablet Take 1-2 tablets by mouth every 6 (six) hours as needed. (Patient not taking: Reported on 06/20/2015) 20 tablet 0  . loratadine (CLARITIN) 10 MG tablet Take 10 mg by mouth daily. Reported on 03/18/2016     No current facility-administered medications on file prior to visit.    Review of Systems Patient denies any headaches, blurred vision, shortness of breath, chest pain, abdominal pain, problems with bowel movements, urination, or intercourse.  Objective:  BP 120/80 mmHg  Pulse 72  Ht 5' 3.5" (1.613 m)  Wt 179 lb (81.194 kg)  BMI 31.21 kg/m2  LMP 07/05/2011 Physical Exam  General:  Well developed, well nourished, no acute distress. She is alert and oriented x3. Skin:  Warm and dry Neck:  Midline trachea, no thyromegaly, large nodule Lt side, ~ 2cm, has been there for years-last u/s maybe 58yr ago, states has been tender lately Cardiovascular: Regular rate and rhythm, no murmur heard Lungs:  Effort normal, all lung fields clear to auscultation bilaterally Breasts:  No dominant palpable mass, retraction, or nipple discharge Abdomen:  Soft, non tender, no hepatosplenomegaly or masses Pelvic:  External genitalia is normal in appearance.  The vagina is normal in appearance. The cervix is bulbous, no CMT. No abnormal d/c, or evidence of bleeding.  Thin prep pap is done w/ HR HPV cotesting. Uterus is surgically absent.  No adnexal masses or tenderness noted. Extremities:  No swelling or varicosities noted Psych:  She has a normal mood and affect  Assessment:   Healthy well-woman exam Chronic Lt thyroid nodule, tender Smoker Obese Perimenopausal  Plan:  Talk to PCP about getting thyroid u/s scheduled Praised for cutting back on smoking, continue to cut back until quit Praised on weight loss efforts F/U 72yr for physical, or sooner if needed Mammogram in Oct, or sooner if problems Colonoscopy  or sooner if  problems  Marge Duncans CNM, [facility] 03/18/2016 9:19 AM

## 2016-03-18 NOTE — Patient Instructions (Signed)
Talk to Doris Davis about getting a thyroid ultrasound Great job on cutting back on your smoking! Continue to cut back until you have quit

## 2016-03-19 LAB — CYTOLOGY - PAP

## 2016-03-26 ENCOUNTER — Other Ambulatory Visit (INDEPENDENT_AMBULATORY_CARE_PROVIDER_SITE_OTHER): Payer: Managed Care, Other (non HMO)

## 2016-03-26 ENCOUNTER — Encounter: Payer: Self-pay | Admitting: Obstetrics & Gynecology

## 2016-03-26 ENCOUNTER — Ambulatory Visit (INDEPENDENT_AMBULATORY_CARE_PROVIDER_SITE_OTHER): Payer: Managed Care, Other (non HMO) | Admitting: Obstetrics & Gynecology

## 2016-03-26 VITALS — BP 148/108 | HR 84 | Ht 63.0 in | Wt 179.0 lb

## 2016-03-26 DIAGNOSIS — R102 Pelvic and perineal pain: Secondary | ICD-10-CM | POA: Diagnosis not present

## 2016-03-26 DIAGNOSIS — R3915 Urgency of urination: Secondary | ICD-10-CM

## 2016-03-26 DIAGNOSIS — R1032 Left lower quadrant pain: Secondary | ICD-10-CM | POA: Diagnosis not present

## 2016-03-26 DIAGNOSIS — N888 Other specified noninflammatory disorders of cervix uteri: Secondary | ICD-10-CM

## 2016-03-26 DIAGNOSIS — R319 Hematuria, unspecified: Secondary | ICD-10-CM

## 2016-03-26 MED ORDER — PHENAZOPYRIDINE HCL 200 MG PO TABS: 200.0000 mg | ORAL_TABLET | Freq: Three times a day (TID) | ORAL | Status: DC | PRN

## 2016-03-26 MED ORDER — URIBEL 118 MG PO CAPS: 1.0000 | ORAL_CAPSULE | Freq: Four times a day (QID) | ORAL | Status: DC

## 2016-03-26 MED ORDER — OXYBUTYNIN CHLORIDE ER 10 MG PO TB24: 10.0000 mg | ORAL_TABLET | Freq: Every day | ORAL | Status: DC

## 2016-03-26 MED ORDER — MIRABEGRON ER 50 MG PO TB24: ORAL_TABLET | ORAL | Status: DC

## 2016-03-26 NOTE — Progress Notes (Signed)
PELVIC US TA/TV: supracervical hysterectomy,normal cx,mult small nabothian cysts,normal ov's bilat (mobile),no free fluid seen,lt adnexal pain during ultrasound

## 2016-03-26 NOTE — Progress Notes (Signed)
Patient ID: Doris Davis, female   DOB: 1970-11-18, 45 y.o.   MRN: [id number]   Chief Complaint  Patient presents with  . left sided pain     HPI:    45 y.o. [id number] Patient's last menstrual period was 07/10/2011.  S/p supracervical hysterectomy Location:  Left lower quadrant. Quality:  Sharp stabbing. Severity:  Moderate to severe. Timing:  Episodic. Duration:  The last 2 weeks. Context:  Patient states she's been having increasing urinary frequency over the past few months really fiasco dating back a year she gets up to go the bathroom 2-3 times every night while she was here this morning she went used the bathroom twice and about 30 minutes. Modifying factors:  Certainly she couldn't sleep last night Signs/Symptoms:  Otherwise none associated    Current Outpatient Prescriptions:  .  ALPRAZolam (XANAX) 0.5 MG tablet, Take 0.5 mg by mouth daily as needed for anxiety. , Disp: , Rfl:  .  buPROPion (WELLBUTRIN XL) 300 MG 24 hr tablet, Take 300 mg by mouth daily., Disp: , Rfl: 0 .  losartan (COZAAR) 100 MG tablet, Take 1 tablet by mouth daily., Disp: , Rfl:  .  Probiotic Product (ALIGN PO), Take 1 tablet by mouth every morning. OTC med to help w/ IBS , Disp: , Rfl:   Problem Pertinent ROS:       No fever chills nausea vomiting diarrhea No chest pain no left upper quadrant pain right upper quadrant pain or right lower quadrant pain Some left lower back pain Associated urinary frequency and urgency as stated above  Extended ROS:     No history of diverticulitis or diverticulosis no food intolerance no recent significant travel history   PMFSH:             Past Medical History:  Diagnosis Date  . Allergy   . Anxiety   . Arthritis   . Asthma    with contact with cats  . BV (bacterial vaginosis) 08/09/2013  . Hypertension   . PONV (postoperative nausea and vomiting)    sts with ablation she was given meds for nausea and "got sicker".  . Vaginal bleeding 08/09/2013   Had  supra cervical hyst 2012, has had bleeding after sex  . Venereal disease 1990   chlamydia  . Wears glasses     Past Surgical History:  Procedure Laterality Date  . ABDOMINAL HYSTERECTOMY    . BUNIONECTOMY  11/14   rt  . CESAREAN SECTION  1995  . ENDOMETRIAL ABLATION  2010   [facility]  . FOOT ARTHROPLASTY  11/15   rt-revision bunionectomy-  . FRACTURE SURGERY  2005   left ankle-[place]  . HAMMER TOE SURGERY Right 12/01/2014   Procedure: REVISION OF RIGHT 3RD HAMMER TOE CORRECTION;  Surgeon: Toni Arthurs, MD;  Location: [facility];  Service: Orthopedics;  Laterality: Right;  . LAPAROSCOPIC SUPRACERVICAL HYSTERECTOMY  07/17/2011   Procedure: LAPAROSCOPIC SUPRACERVICAL HYSTERECTOMY;  Surgeon: Lazaro Arms, MD;  Location: [facility];  Service: Gynecology;  Laterality: N/A;  . REMOVAL OF IMPLANT Right 12/01/2014   Procedure: REMOVAL OF  DEEP IMPLANT FROM RIGHT 3RD TOE;  Surgeon: Toni Arthurs, MD;  Location: [facility];  Service: Orthopedics;  Laterality: Right;    OB History    Gravida Para Term Preterm AB Living   5 2 1 1 3 1    SAB TAB Ectopic Multiple Live Births   3       2  Allergies  Allergen Reactions  . Celexa [Citalopram Hydrobromide] Anaphylaxis  . Coconut Oil Anaphylaxis  . Penicillins Anaphylaxis    Social History   Social History  . Marital status: Married    Spouse name: N/A  . Number of children: N/A  . Years of education: N/A   Social History Main Topics  . Smoking status: Current Every Day Smoker    Packs/day: 0.25    Years: 20.00    Types: Cigarettes    Last attempt to quit: 12/25/2013  . Smokeless tobacco: Never Used  . Alcohol use Yes     Comment: occ  . Drug use: No  . Sexual activity: Yes    Birth control/ protection: Surgical   Other Topics Concern  . None   Social History Narrative  . None    Family History  Problem Relation Age of Onset  . Coronary artery disease Mother   . Hypertension Mother   . Stroke  Mother   . Diabetes Paternal Grandmother   . Diabetes Paternal Uncle   . Anesthesia problems Neg Hx   . Malignant hyperthermia Neg Hx   . Hypotension Neg Hx   . Pseudochol deficiency Neg Hx      Examination:  Vitals:  Blood pressure (!) 148/108, pulse 84, height 5\' 3"  (1.6 m), weight 179 lb (81.2 kg), last menstrual period 07/10/2011.    Physical Examination:     Abdomen:  Abnormal shape: obese and non distended Skin: normal Palpation: Tenderness in the left lower quadrant point tenderness along the rectus anterior no oblique muscle no hernia appreciated no rebound no guarding Positive Carnett's sign Tenderness: As above moderate  Trigger Point Injection   Pre-operative diagnosis: myofascial pain  Post-operative diagnosis: myofascial pain  After risks and benefits were explained including bleeding, infection, worsening of the pain, damage to the area being injected, weakness, allergic reaction to medications, vascular injection, and nerve damage, signed consent was obtained.  All questions were answered.    The area of the trigger point was identified and the skin prepped three times with alcohol and the alcohol allowed to dry.  Next, a 25 gauge 1.5 inch needle was placed in the area of the trigger point.  Once reproduction of the pain was elicited and negative aspiration confirmed, the trigger point was injected and the needle removed.    The patient did tolerate the procedure well and there were not complications.    Medication used: 0.5% Marcaine 10 cc down to the rectus fascia and a circular manner    Trigger points injected: 1    Trigger point(s) location(s):  left   Vulva:  normal appearing vulva with no masses, tenderness or lesions Vagina:  normal mucosa, no discharge Cervix:  no cervical motion tenderness and no lesions Uterus:  uterus absent Adnexa: ovaries:present,  normal adnexa in size, nontender and no masses  No results found for this or any previous visit  (from the past 72 hour(s)).   DATA orders and reviews: Labs were not ordered today:   Imaging studies were ordered today:  Pelvic sonogram  Lab tests were not reviewed today:    Imaging studies were not reviewed today:    I did not independently review/view images, tracing or specimen(not simply the report) myself.    Prescription Drug Management:   Prescriptions prescribed this encounter:    Meds ordered this encounter  Medications  . buPROPion (WELLBUTRIN XL) 300 MG 24 hr tablet    Sig: Take 300 mg by mouth  daily.    Refill:  0  . DISCONTD: Meth-Hyo-M Bl-Na Phos-Ph Sal (URIBEL) 118 MG CAPS    Sig: Take 1 capsule (118 mg total) by mouth 4 (four) times daily.    Dispense:  120 capsule    Refill:  11  . DISCONTD: mirabegron ER (MYRBETRIQ) 50 MG TB24 tablet    Sig: Take 1 at bedtime    Dispense:  30 tablet    Refill:  11  . DISCONTD: oxybutynin (DITROPAN-XL) 10 MG 24 hr tablet    Sig: Take 1 tablet (10 mg total) by mouth at bedtime.    Dispense:  30 tablet    Refill:  11  . DISCONTD: phenazopyridine (PYRIDIUM) 200 MG tablet    Sig: Take 1 tablet (200 mg total) by mouth 3 (three) times daily as needed for pain.    Dispense:  90 tablet    Refill:  3    Renewed Prescriptions:    Current prescription changes:     Impression/Plan(Problem Based):  1. Hematuria  - Urine culture  2. LLQ pain  - US Pelvis Complete; Future - US Transvaginal Non-OB; Future  3. Urgency of urination   4. Abdominal wall pain in left lower quadrant    Follow Up:   Return in about 2 weeks (around 04/09/2016) for Follow up, with Dr Despina Hidden.         All questions were answered.

## 2016-03-28 LAB — URINE CULTURE

## 2016-04-01 ENCOUNTER — Other Ambulatory Visit: Payer: Self-pay | Admitting: Obstetrics & Gynecology

## 2016-04-01 MED ORDER — CIPROFLOXACIN HCL 500 MG PO TABS: 500.0000 mg | ORAL_TABLET | Freq: Two times a day (BID) | ORAL | 0 refills | Status: DC

## 2016-04-01 NOTE — Telephone Encounter (Signed)
Pt informed urine culture from 03/26/2016 + for E. Coli, Cipro e-scribed. Pt verbalized understanding.

## 2016-04-09 ENCOUNTER — Ambulatory Visit: Payer: Managed Care, Other (non HMO) | Admitting: Obstetrics & Gynecology

## 2016-04-15 ENCOUNTER — Ambulatory Visit: Payer: Managed Care, Other (non HMO) | Admitting: Obstetrics & Gynecology

## 2016-04-23 ENCOUNTER — Ambulatory Visit (INDEPENDENT_AMBULATORY_CARE_PROVIDER_SITE_OTHER): Payer: Managed Care, Other (non HMO) | Admitting: Obstetrics & Gynecology

## 2016-04-23 ENCOUNTER — Encounter: Payer: Self-pay | Admitting: Obstetrics & Gynecology

## 2016-04-23 VITALS — BP 120/80 | HR 74 | Wt 180.0 lb

## 2016-04-23 DIAGNOSIS — N390 Urinary tract infection, site not specified: Secondary | ICD-10-CM | POA: Diagnosis not present

## 2016-04-23 NOTE — Progress Notes (Signed)
      Chief Complaint  Patient presents with  . Follow-up    bladder infection    Blood pressure 120/80, pulse 74, weight 180 lb (81.6 kg), last menstrual period 07/10/2011.  45 y.o. [id number] Patient's last menstrual period was 07/10/2011. The current method of family planning is status post hysterectomy.  Subjective Pt conFusing clinical picture the last time she was here about a month ago Essentially her urinalysis was negative and it appeared she had detrusor instability but her culture came back positive for Escherichia coli in her symptoms have cleared on a course of Cipro a repeat urine cultures performed today She states her blood and urine frequency urgency dysuria all cleared  Objective   Pertinent ROS .lhersom   Labs or studies Urinalysis today is negative, will culture urine for TOC    Impression Diagnoses this Encounter::   ICD-9-CM ICD-10-CM   1. UTI (lower urinary tract infection) 599.0 N39.0     Established relevant diagnosis(es):   Plan/Recommendations: No orders of the defined types were placed in this encounter.   Labs or Scans Ordered: No orders of the defined types were placed in this encounter.   Management:: Test of cure today and follow-up as needed  Follow up Return if symptoms worsen or fail to improve.        Face to face time:  10 minutes  Greater than 50% of the visit time was spent in counseling and coordination of care with the patient.  The summary and outline of the counseling and care coordination is summarized in the note above.   All questions were answered.

## 2016-04-23 NOTE — Addendum Note (Signed)
Addended by: Federico FlakeNES, PEGGY A on: 04/23/2016 09:31 AM   Modules accepted: Orders

## 2016-04-24 LAB — URINE CULTURE: Organism ID, Bacteria: NO GROWTH

## 2016-04-29 ENCOUNTER — Telehealth: Payer: Self-pay | Admitting: Obstetrics & Gynecology

## 2016-04-29 NOTE — Telephone Encounter (Signed)
Pt called stating that she would like to know the results of her urine test. Please contact pt

## 2016-04-29 NOTE — Telephone Encounter (Signed)
Pt informed urine culture negative from 04/23/2016. Pt verbalized understanding.

## 2016-06-13 ENCOUNTER — Ambulatory Visit (HOSPITAL_COMMUNITY)
Admission: RE | Admit: 2016-06-13 | Discharge: 2016-06-13 | Disposition: A | Discharge status: Home / Self Care | Payer: Managed Care, Other (non HMO) | Source: Ambulatory Visit | Attending: Internal Medicine | Admitting: Internal Medicine

## 2016-06-13 ENCOUNTER — Other Ambulatory Visit (HOSPITAL_COMMUNITY): Payer: Self-pay | Admitting: Internal Medicine

## 2016-06-13 DIAGNOSIS — E041 Nontoxic single thyroid nodule: Secondary | ICD-10-CM

## 2016-06-13 DIAGNOSIS — E042 Nontoxic multinodular goiter: Secondary | ICD-10-CM | POA: Insufficient documentation

## 2016-06-14 ENCOUNTER — Other Ambulatory Visit: Payer: Self-pay | Admitting: Internal Medicine

## 2016-06-14 DIAGNOSIS — E041 Nontoxic single thyroid nodule: Secondary | ICD-10-CM

## 2016-06-18 ENCOUNTER — Other Ambulatory Visit (HOSPITAL_COMMUNITY)
Admission: RE | Admit: 2016-06-18 | Payer: Managed Care, Other (non HMO) | Source: Ambulatory Visit | Admitting: Radiology

## 2016-06-18 ENCOUNTER — Other Ambulatory Visit (HOSPITAL_COMMUNITY)
Admission: RE | Admit: 2016-06-18 | Discharge: 2016-06-18 | Disposition: A | Discharge status: Home / Self Care | Payer: Managed Care, Other (non HMO) | Source: Ambulatory Visit | Attending: Radiology | Admitting: Radiology

## 2016-06-18 ENCOUNTER — Other Ambulatory Visit: Payer: 59

## 2016-06-18 ENCOUNTER — Ambulatory Visit
Admission: RE | Admit: 2016-06-18 | Discharge: 2016-06-18 | Disposition: A | Discharge status: Home / Self Care | Payer: Managed Care, Other (non HMO) | Source: Ambulatory Visit | Attending: Internal Medicine | Admitting: Internal Medicine

## 2016-06-18 DIAGNOSIS — E041 Nontoxic single thyroid nodule: Secondary | ICD-10-CM | POA: Diagnosis not present

## 2016-06-23 ENCOUNTER — Emergency Department (HOSPITAL_COMMUNITY)
Admission: EM | Admit: 2016-06-23 | Discharge: 2016-06-23 | Disposition: A | Discharge status: Home / Self Care | Payer: Managed Care, Other (non HMO) | Attending: Emergency Medicine | Admitting: Emergency Medicine

## 2016-06-23 ENCOUNTER — Encounter (HOSPITAL_COMMUNITY): Payer: Self-pay | Admitting: *Deleted

## 2016-06-23 DIAGNOSIS — I10 Essential (primary) hypertension: Secondary | ICD-10-CM | POA: Diagnosis not present

## 2016-06-23 DIAGNOSIS — Z79899 Other long term (current) drug therapy: Secondary | ICD-10-CM | POA: Diagnosis not present

## 2016-06-23 DIAGNOSIS — Y9389 Activity, other specified: Secondary | ICD-10-CM | POA: Diagnosis not present

## 2016-06-23 DIAGNOSIS — Y92007 Garden or yard of unspecified non-institutional (private) residence as the place of occurrence of the external cause: Secondary | ICD-10-CM | POA: Insufficient documentation

## 2016-06-23 DIAGNOSIS — H1131 Conjunctival hemorrhage, right eye: Secondary | ICD-10-CM

## 2016-06-23 DIAGNOSIS — Y999 Unspecified external cause status: Secondary | ICD-10-CM | POA: Diagnosis not present

## 2016-06-23 DIAGNOSIS — S0501XA Injury of conjunctiva and corneal abrasion without foreign body, right eye, initial encounter: Secondary | ICD-10-CM

## 2016-06-23 DIAGNOSIS — X58XXXA Exposure to other specified factors, initial encounter: Secondary | ICD-10-CM | POA: Diagnosis not present

## 2016-06-23 DIAGNOSIS — F17210 Nicotine dependence, cigarettes, uncomplicated: Secondary | ICD-10-CM | POA: Diagnosis not present

## 2016-06-23 DIAGNOSIS — J45909 Unspecified asthma, uncomplicated: Secondary | ICD-10-CM | POA: Insufficient documentation

## 2016-06-23 DIAGNOSIS — S0591XA Unspecified injury of right eye and orbit, initial encounter: Secondary | ICD-10-CM | POA: Diagnosis present

## 2016-06-23 MED ORDER — TOBRAMYCIN 0.3 % OP SOLN: 2.0000 [drp] | OPHTHALMIC | 0 refills | Status: DC

## 2016-06-23 MED ORDER — HYDROCODONE-ACETAMINOPHEN 5-325 MG PO TABS: 2.0000 | ORAL_TABLET | ORAL | 0 refills | Status: DC | PRN

## 2016-06-23 MED ORDER — TETRACAINE HCL 0.5 % OP SOLN
1.0000 [drp] | Freq: Once | OPHTHALMIC | Status: AC
  Administered 2016-06-23: 1 [drp] via OPHTHALMIC
  Filled 2016-06-23: qty 4

## 2016-06-23 MED ORDER — FLUORESCEIN SODIUM 1 MG OP STRP
1.0000 | ORAL_STRIP | Freq: Once | OPHTHALMIC | Status: AC
  Administered 2016-06-23: 1 via OPHTHALMIC
  Filled 2016-06-23: qty 1

## 2016-06-23 MED ORDER — TOBRAMYCIN 0.3 % OP SOLN
1.0000 [drp] | Freq: Once | OPHTHALMIC | Status: DC
  Filled 2016-06-23: qty 5

## 2016-06-23 NOTE — ED Notes (Signed)
Eye pad with paper tape applied over right eye-patient tolerated well.

## 2016-06-23 NOTE — ED Triage Notes (Signed)
Pt was working in the yard yesterday when her eye became irritated and itchy. States she woke up today with worsening pain and irritation. Pt has notable amount of redness to right outer eye.

## 2016-06-23 NOTE — ED Provider Notes (Signed)
[facility] Provider Note   CSN: [id number] Arrival date & time: 06/23/16  0950     History   Chief Complaint Chief Complaint  Patient presents with  . Eye Injury    HPI Doris Davis is a 45 y.o. female.  The history is provided by the patient. No language interpreter was used.  Eye Injury  This is a new problem. The current episode started yesterday. The problem occurs constantly. The problem has been gradually worsening. Nothing aggravates the symptoms. Nothing relieves the symptoms. She has tried nothing for the symptoms. The treatment provided no relief.  Pt complains of pain in right eye.  Pt thinks she cut eye with a balde of grass.  Pt feels like something is in her eye.  Past Medical History:  Diagnosis Date  . Allergy   . Anxiety   . Arthritis   . Asthma    with contact with cats  . BV (bacterial vaginosis) 08/09/2013  . Hypertension   . PONV (postoperative nausea and vomiting)    sts with ablation she was given meds for nausea and "got sicker".  . Vaginal bleeding 08/09/2013   Had supra cervical hyst 2012, has had bleeding after sex  . Venereal disease 1990   chlamydia  . Wears glasses     Patient Active Problem List   Diagnosis Date Noted  . Obese 03/18/2016  . Smoker 03/18/2016  . Thyroid nodule 03/18/2016  . Perimenopausal 03/18/2016  . Hypertension 08/09/2013  . Vaginal bleeding 08/09/2013  . BV (bacterial vaginosis) 08/09/2013  . Stress fracture of foot with nonunion 06/02/2013  . Infection of right foot 04/01/2013  . Right foot pain 04/01/2013    Past Surgical History:  Procedure Laterality Date  . ABDOMINAL HYSTERECTOMY    . BUNIONECTOMY  11/14   rt  . CESAREAN SECTION  1995  . ENDOMETRIAL ABLATION  2010   [facility]  . FOOT ARTHROPLASTY  11/15   rt-revision bunionectomy-  . FRACTURE SURGERY  2005   left ankle-Williams  . HAMMER TOE SURGERY Right 12/01/2014   Procedure: REVISION OF RIGHT 3RD HAMMER TOE CORRECTION;  Surgeon: Toni ArthursJohn  Hewitt, MD;  Location: [facility];  Service: Orthopedics;  Laterality: Right;  . LAPAROSCOPIC SUPRACERVICAL HYSTERECTOMY  07/17/2011   Procedure: LAPAROSCOPIC SUPRACERVICAL HYSTERECTOMY;  Surgeon: Lazaro ArmsLuther H Eure, MD;  Location: [facility];  Service: Gynecology;  Laterality: N/A;  . REMOVAL OF IMPLANT Right 12/01/2014   Procedure: REMOVAL OF  DEEP IMPLANT FROM RIGHT 3RD TOE;  Surgeon: Toni ArthursJohn Hewitt, MD;  Location: [facility];  Service: Orthopedics;  Laterality: Right;    OB History    Gravida Para Term Preterm AB Living   5 2 1 1 3 1    SAB TAB Ectopic Multiple Live Births   3       2       Home Medications    Prior to Admission medications   Medication Sig Start Date End Date Taking? Authorizing Provider  buPROPion (WELLBUTRIN XL) 300 MG 24 hr tablet Take 300 mg by mouth daily. 03/01/16  Yes Historical Provider, MD  losartan (COZAAR) 100 MG tablet Take 1 tablet by mouth daily. 03/24/14  Yes Historical Provider, MD  Probiotic Product (ALIGN PO) Take 1 tablet by mouth every morning. OTC med to help w/ IBS    Yes Historical Provider, MD  ALPRAZolam Prudy Feeler(XANAX) 0.5 MG tablet Take 0.5 mg by mouth daily as needed for anxiety.  03/24/14   Historical Provider, MD  HYDROcodone-acetaminophen (NORCO/VICODIN) 5-325 MG tablet Take 2 tablets by mouth every 4 (four) hours as needed. 06/23/16   Elson Areas, PA-C  tobramycin (TOBREX) 0.3 % ophthalmic solution Place 2 drops into the right eye every 4 (four) hours. 06/23/16   Elson Areas, PA-C    Family History Family History  Problem Relation Age of Onset  . Coronary artery disease Mother   . Hypertension Mother   . Stroke Mother   . Diabetes Paternal Grandmother   . Diabetes Paternal Uncle   . Anesthesia problems Neg Hx   . Malignant hyperthermia Neg Hx   . Hypotension Neg Hx   . Pseudochol deficiency Neg Hx     Social History Social History  Substance Use Topics  . Smoking status: Current Every Day Smoker     Packs/day: 0.25    Years: 20.00    Types: Cigarettes    Last attempt to quit: 12/25/2013  . Smokeless tobacco: Never Used  . Alcohol use Yes     Comment: occ     Allergies   Celexa [citalopram hydrobromide]; Coconut oil; and Penicillins   Review of Systems Review of Systems  All other systems reviewed and are negative.    Physical Exam Updated Vital Signs BP (!) 144/104 (BP Location: Right Arm)   Pulse 106   Temp 98.4 F (36.9 C) (Oral)   Resp 16   Ht 5\' 3"  (1.6 m)   Wt 79.4 kg   LMP 07/10/2011   SpO2 98%   BMI 31.00 kg/m   Physical Exam  Constitutional: She appears well-developed and well-nourished. No distress.  HENT:  Head: Normocephalic and atraumatic.  Eyes: Conjunctivae are normal. Pupils are equal, round, and reactive to light.  Injected right conjunctiva,  Subconjunctival hemmorhage.  I everted and swabbed under eyelids.  No foreign body.  I swabbed area of blood,  Under conjunctiva,  No penetration no foreign body. fluro small abrasion  Neck: Neck supple.  Cardiovascular: Normal rate.   No murmur heard. Pulmonary/Chest: No respiratory distress.  Abdominal: Soft. There is no tenderness.  Musculoskeletal: She exhibits no edema.  Neurological: She is alert.  Skin: Skin is warm and dry.  Psychiatric: She has a normal mood and affect.  Nursing note and vitals reviewed.    ED Treatments / Results  Labs (all labs ordered are listed, but only abnormal results are displayed) Labs Reviewed - No data to display  EKG  EKG Interpretation None       Radiology No results found.  Procedures Procedures (including critical care time)  Medications Ordered in ED Medications  fluorescein ophthalmic strip 1 strip (1 strip Right Eye Given by Other 06/23/16 1100)  tetracaine (PONTOCAINE) 0.5 % ophthalmic solution 1 drop (1 drop Right Eye Given by Other 06/23/16 1100)     Initial Impression / Assessment and Plan / ED Course  I have reviewed the triage  vital signs and the nursing notes.  Pertinent labs & imaging results that were available during my care of the patient were reviewed by me and considered in my medical decision making (see chart for details).  Clinical Course      Final Clinical Impressions(s) / ED Diagnoses   Final diagnoses:  Corneal abrasion, right, initial encounter  Subconjunctival hemorrhage of right eye    New Prescriptions Discharge Medication List as of 06/23/2016 11:18 AM    START taking these medications   Details  HYDROcodone-acetaminophen (NORCO/VICODIN) 5-325 MG tablet Take 2 tablets by mouth every 4 (  four) hours as needed., Starting Sun 06/23/2016, Print    tobramycin (TOBREX) 0.3 % ophthalmic solution Place 2 drops into the right eye every 4 (four) hours., Starting Sun 06/23/2016, Print         Lonia Skinner [place], PA-C 06/23/16 1502    Bethann Berkshire, MD 06/26/16 1227

## 2016-08-13 ENCOUNTER — Ambulatory Visit: Payer: Self-pay | Admitting: Surgery

## 2016-09-11 ENCOUNTER — Encounter: Payer: Self-pay | Admitting: Surgery

## 2016-09-11 DIAGNOSIS — E042 Nontoxic multinodular goiter: Secondary | ICD-10-CM | POA: Diagnosis present

## 2016-09-11 NOTE — H&P (Signed)
General Surgery [facility]  Doris Davis DOB: 12/18/1970 Married / Language: English / Race: White Female  History of Present Illness  The patient is a 46 year old female who presents with a complaint of Enlarged thyroid.  Patient is referred by Dr. Dorisann Frames for evaluation for surgery for management of multinodular thyroid goiter with compressive symptoms. Patient's primary care physician is Dr. Dwana Melena in [place]. Patient was initially diagnosed with multinodular thyroid 15 years ago. She has never been on thyroid medication. She has had no prior head or neck surgery. Patient has developed mild compressive symptoms as the gland has enlarged over the years. She now notes solid food dysphagia. She has difficulty lying flat causing shortness of breath. She has noted some hair loss. She also complains of fatigue. Recent thyroid ultrasound on June 13, 2016 shows an enlarged gland with the right lobe measuring 6.7 cm and left lobe measuring 7.8 cm. There are multiple nodules on the right with the largest measuring 2.8 cm. The largest nodule on the left measures 6.3 cm. All nodules have shown some level of interval enlargement. Thyroid function tests are normal with a TSH level of 0.69. Fine-needle aspiration biopsies were performed on the dominant nodules and were benign, Bethesda category II. Patient now presents to discuss total thyroidectomy. There is no family history of thyroid disease or other endocrine neoplasm.   Other Problems Anxiety Disorder  General anesthesia - complications  High blood pressure   Past Surgical History Cesarean Section - 1  Foot Surgery  Bilateral. Hysterectomy (not due to cancer) - Partial   Diagnostic Studies History  Colonoscopy  >10 years ago Mammogram  1-3 years ago Pap Smear  1-5 years ago  Allergies  CeleXA *ANTIDEPRESSANTS*  Anaphylaxis. Coconut Oil *ALTERNATIVE MEDICINES*   Anaphylaxis. Penicillins  Anaphylaxis.  Medication History ALPRAZolam (0.5MG  Tablet, Oral as needed) Active. BuPROPion HCl ER (XL) (300MG  Tablet ER 24HR, Oral daily) Active. Losartan Potassium (100MG  Tablet, Oral daily) Active. Align (Oral daily) Active. Medications Reconciled  Social History Alcohol use  Moderate alcohol use. Caffeine use  Coffee. No drug use  Tobacco use  Current every day smoker.  Family History Anesthetic complications  Mother. Cerebrovascular Accident  Mother. Heart Disease  Mother. Heart disease in female family member before age 6  Hypertension  Mother.  Pregnancy / Birth History  Age at menarche  9 years. Age of menopause  3-50 Gravida  2 Length (months) of breastfeeding  7-12 Maternal age  71-25 Para  2  Review of Systems General Present- Fatigue. Not Present- Appetite Loss, Chills, Fever, Night Sweats, Weight Gain and Weight Loss. Skin Not Present- Change in Wart/Mole, Dryness, Hives, Jaundice, New Lesions, Non-Healing Wounds, Rash and Ulcer. HEENT Present- Hoarseness and Wears glasses/contact lenses. Not Present- Earache, Hearing Loss, Nose Bleed, Oral Ulcers, Ringing in the Ears, Seasonal Allergies, Sinus Pain, Sore Throat, Visual Disturbances and Yellow Eyes. Respiratory Present- Difficulty Breathing. Not Present- Bloody sputum, Chronic Cough, Snoring and Wheezing. Breast Not Present- Breast Mass, Breast Pain, Nipple Discharge and Skin Changes. Cardiovascular Not Present- Chest Pain, Difficulty Breathing Lying Down, Leg Cramps, Palpitations, Rapid Heart Rate, Shortness of Breath and Swelling of Extremities. Gastrointestinal Present- Difficulty Swallowing. Not Present- Abdominal Pain, Bloating, Bloody Stool, Change in Bowel Habits, Chronic diarrhea, Constipation, Excessive gas, Gets full quickly at meals, Hemorrhoids, Indigestion, Nausea, Rectal Pain and Vomiting. Female Genitourinary Not Present- Frequency, Nocturia, Painful  Urination, Pelvic Pain and Urgency. Musculoskeletal Not Present- Back Pain, Joint Pain, Joint  Stiffness, Muscle Pain, Muscle Weakness and Swelling of Extremities. Neurological Not Present- Decreased Memory, Fainting, Headaches, Numbness, Seizures, Tingling, Tremor, Trouble walking and Weakness. Psychiatric Present- Anxiety. Not Present- Bipolar, Change in Sleep Pattern, Depression, Fearful and Frequent crying. Endocrine Present- Cold Intolerance and Hair Changes. Not Present- Excessive Hunger, Heat Intolerance, Hot flashes and New Diabetes. Hematology Not Present- Blood Thinners, Easy Bruising, Excessive bleeding, Gland problems, HIV and Persistent Infections.  Vitals Weight: 175.6 lb Height: 63in Body Surface Area: 1.83 m Body Mass Index: 31.11 kg/m  Temp.: 98.28F  Pulse: 84 (Regular)  BP: 134/84 (Sitting, Left Arm, Standard)   Physical Exam The physical exam findings are as follows: Note:General - appears comfortable, no distress; not diaphorectic  HEENT - normocephalic; sclerae clear, gaze conjugate; mucous membranes moist, dentition good; voice normal  Neck - asymmetric on extension; no palpable anterior or posterior cervical adenopathy; enlarged, multinodular thyroid; left lobe greater in size than the right; mild tracheal deviation to the right  Chest - clear bilaterally without rhonchi, rales, or wheeze  Cor - regular rhythm with normal rate; no significant murmur  Ext - non-tender without significant edema or lymphedema  Neuro - grossly intact; no tremor   Assessment & Plan  MULTIPLE THYROID NODULES (E04.2) ENLARGED THYROID (E04.9)  Pt Education - Pamphlet Given - The Thyroid Book: discussed with patient and provided information. Patient presents on referral from her endocrinologist for consideration for total thyroidectomy for management of multinodular thyroid goiter with compressive symptoms. Patient is provided with written literature on thyroid surgery  to review at home.  I have recommended proceeding with total thyroidectomy for management of multinodular goiter with compressive symptoms. We discussed the risk and benefits of the procedure including the potential for recurrent laryngeal nerve injury and injury to parathyroid glands. We discussed the location of the surgical incision. We discussed the hospital stay to be anticipated. We discussed her postoperative recovery and return to work and activity. We discussed the cosmetic results to be anticipated. We discussed the need for lifelong thyroid hormone replacement. Patient understands and wishes to proceed with surgery in the near future.  The risks and benefits of the procedure have been discussed at length with the patient. The patient understands the proposed procedure, potential alternative treatments, and the course of recovery to be expected. All of the patient's questions have been answered at this time. The patient wishes to proceed with surgery.  Velora Hecklerodd M. Rajeev Escue, MD, FACS General & Endocrine Surgery [facility] Office: [phone]

## 2016-09-16 ENCOUNTER — Encounter (HOSPITAL_COMMUNITY)
Admission: RE | Admit: 2016-09-16 | Discharge: 2016-09-16 | Disposition: A | Discharge status: Home / Self Care | Payer: Managed Care, Other (non HMO) | Source: Ambulatory Visit | Attending: Surgery | Admitting: Surgery

## 2016-09-16 ENCOUNTER — Encounter (HOSPITAL_COMMUNITY): Payer: Self-pay

## 2016-09-16 DIAGNOSIS — I10 Essential (primary) hypertension: Secondary | ICD-10-CM | POA: Diagnosis not present

## 2016-09-16 DIAGNOSIS — Z888 Allergy status to other drugs, medicaments and biological substances status: Secondary | ICD-10-CM | POA: Diagnosis not present

## 2016-09-16 DIAGNOSIS — Z91018 Allergy to other foods: Secondary | ICD-10-CM | POA: Diagnosis not present

## 2016-09-16 DIAGNOSIS — Z01810 Encounter for preprocedural cardiovascular examination: Secondary | ICD-10-CM | POA: Insufficient documentation

## 2016-09-16 DIAGNOSIS — E049 Nontoxic goiter, unspecified: Secondary | ICD-10-CM | POA: Insufficient documentation

## 2016-09-16 DIAGNOSIS — F419 Anxiety disorder, unspecified: Secondary | ICD-10-CM | POA: Diagnosis not present

## 2016-09-16 DIAGNOSIS — E042 Nontoxic multinodular goiter: Secondary | ICD-10-CM | POA: Insufficient documentation

## 2016-09-16 DIAGNOSIS — Z880 Allergy status to penicillin: Secondary | ICD-10-CM | POA: Diagnosis not present

## 2016-09-16 DIAGNOSIS — Z01812 Encounter for preprocedural laboratory examination: Secondary | ICD-10-CM

## 2016-09-16 DIAGNOSIS — F17200 Nicotine dependence, unspecified, uncomplicated: Secondary | ICD-10-CM | POA: Diagnosis not present

## 2016-09-16 HISTORY — DX: Anemia, unspecified: D64.9

## 2016-09-16 HISTORY — DX: Family history of other specified conditions: Z84.89

## 2016-09-16 LAB — BASIC METABOLIC PANEL
ANION GAP: 8 (ref 5–15)
BUN: 12 mg/dL (ref 6–20)
CALCIUM: 9.4 mg/dL (ref 8.9–10.3)
CO2: 24 mmol/L (ref 22–32)
CREATININE: 0.78 mg/dL (ref 0.44–1.00)
Chloride: 106 mmol/L (ref 101–111)
GFR calc non Af Amer: >60 mL/min (ref >60)
Glucose, Bld: 90 mg/dL (ref 65–99)
Potassium: 3.4 mmol/L — ABNORMAL LOW (ref 3.5–5.1)
SODIUM: 138 mmol/L (ref 135–145)

## 2016-09-16 LAB — CBC
HCT: 37.3 % (ref 36.0–46.0)
HEMOGLOBIN: 11.9 g/dL — AB (ref 12.0–15.0)
MCH: 27.2 pg (ref 26.0–34.0)
MCHC: 31.9 g/dL (ref 30.0–36.0)
MCV: 85.4 fL (ref 78.0–100.0)
Platelets: 251 10*3/uL (ref 150–400)
RBC: 4.37 MIL/uL (ref 3.87–5.11)
RDW: 13.8 % (ref 11.5–15.5)
WBC: 9.4 10*3/uL (ref 4.0–10.5)

## 2016-09-16 NOTE — Progress Notes (Signed)
Patient's mother died suddenly back in or around 2010.  She went to get a cardiac work up, echo, stress - all which she says came back "normal" .  She is not sure of who she went to see, "they are out of business now" and she can't remember the doctor.  She was unable to get any records. Denies any chest pains, SOB, DOE.   Dr. Hughie ClossZ Hall is her PCP.   LOV was 06/2016

## 2016-09-16 NOTE — Pre-Procedure Instructions (Signed)
    West PughDonna Sovine  09/16/2016      [id number] [place], [place] - [address] AT [facility] [address] [place] [id number] Phone: [phone] Fax: [phone]  [id number] - [place], [place] - [id number] [place] [address] Phone: [phone] Fax: [phone]  [facility] [address] - [place], [place] - [address] [place] [address] [place] [address]  [place] [id number] Phone: [phone] Fax: [phone]    Your procedure is scheduled on Thursday, January 11th   [facility] Admitting at 5:30 AM.             (posted surgery time 7:30 - 9:30 AM)   Call this number if you have problems the MORNING of surgery:  [phone]   Remember:  Do not eat food or drink liquids after midnight Wednesday.   Take these medicines the morning of surgery with A SIP OF WATER : Xanax, Wellbutrin              4-5 days prior to surgery, STOP taking any Vitamins, Herbal Supplements, Anti-inflammatories.   Do not wear jewelry, make-up or nail polish.  Do not wear lotions, powders, perfumes, or deoderant.   Do not shave underarms & legs 48 hours prior to surgery.    Do not bring valuables to the hospital.  [facility] is not responsible for any belongings or valuables.  Contacts, dentures or bridgework may not be worn into surgery.  Leave your suitcase in the car.  After surgery it may be brought to your room.  For patients admitted to the hospital, discharge time will be determined by your treatment team.  Please read over the following fact sheets that you were given. Pain Booklet and Surgical Site Infection Prevention

## 2016-09-18 MED ORDER — CIPROFLOXACIN IN D5W 400 MG/200ML IV SOLN
400.0000 mg | INTRAVENOUS | Status: AC
  Administered 2016-09-19: 400 mg via INTRAVENOUS
  Filled 2016-09-18 (×2): qty 200

## 2016-09-19 ENCOUNTER — Encounter (HOSPITAL_COMMUNITY)
Admission: RE | Disposition: A | Discharge status: Home / Self Care | Payer: Self-pay | Source: Ambulatory Visit | Attending: Surgery

## 2016-09-19 ENCOUNTER — Ambulatory Visit (HOSPITAL_COMMUNITY): Payer: Managed Care, Other (non HMO) | Admitting: Anesthesiology

## 2016-09-19 ENCOUNTER — Encounter (HOSPITAL_COMMUNITY): Payer: Self-pay | Admitting: *Deleted

## 2016-09-19 ENCOUNTER — Observation Stay (HOSPITAL_COMMUNITY)
Admission: RE | Admit: 2016-09-19 | Discharge: 2016-09-20 | Disposition: A | Discharge status: Home / Self Care | Payer: Managed Care, Other (non HMO) | Source: Ambulatory Visit | Attending: Surgery | Admitting: Surgery

## 2016-09-19 DIAGNOSIS — Z888 Allergy status to other drugs, medicaments and biological substances status: Secondary | ICD-10-CM | POA: Insufficient documentation

## 2016-09-19 DIAGNOSIS — Z91018 Allergy to other foods: Secondary | ICD-10-CM | POA: Insufficient documentation

## 2016-09-19 DIAGNOSIS — E042 Nontoxic multinodular goiter: Principal | ICD-10-CM | POA: Diagnosis present

## 2016-09-19 DIAGNOSIS — Z880 Allergy status to penicillin: Secondary | ICD-10-CM | POA: Insufficient documentation

## 2016-09-19 DIAGNOSIS — F419 Anxiety disorder, unspecified: Secondary | ICD-10-CM | POA: Insufficient documentation

## 2016-09-19 DIAGNOSIS — I10 Essential (primary) hypertension: Secondary | ICD-10-CM | POA: Insufficient documentation

## 2016-09-19 DIAGNOSIS — F17200 Nicotine dependence, unspecified, uncomplicated: Secondary | ICD-10-CM | POA: Insufficient documentation

## 2016-09-19 HISTORY — PX: THYROIDECTOMY: SHX17

## 2016-09-19 SURGERY — THYROIDECTOMY
Anesthesia: General | Site: Neck

## 2016-09-19 MED ORDER — MIDAZOLAM HCL 5 MG/5ML IJ SOLN
INTRAMUSCULAR | Status: DC | PRN
  Administered 2016-09-19: 2 mg via INTRAVENOUS

## 2016-09-19 MED ORDER — PROMETHAZINE HCL 25 MG/ML IJ SOLN
12.5000 mg | INTRAMUSCULAR | Status: DC | PRN
  Administered 2016-09-19: 12.5 mg via INTRAVENOUS
  Filled 2016-09-19: qty 1

## 2016-09-19 MED ORDER — PROPOFOL 10 MG/ML IV BOLUS
INTRAVENOUS | Status: AC
  Filled 2016-09-19: qty 20

## 2016-09-19 MED ORDER — FENTANYL CITRATE (PF) 100 MCG/2ML IJ SOLN
INTRAMUSCULAR | Status: DC | PRN
  Administered 2016-09-19 (×2): 50 ug via INTRAVENOUS
  Administered 2016-09-19 (×2): 100 ug via INTRAVENOUS

## 2016-09-19 MED ORDER — PHENYLEPHRINE 40 MCG/ML (10ML) SYRINGE FOR IV PUSH (FOR BLOOD PRESSURE SUPPORT)
PREFILLED_SYRINGE | INTRAVENOUS | Status: AC
  Filled 2016-09-19: qty 10

## 2016-09-19 MED ORDER — LIDOCAINE HCL (CARDIAC) 20 MG/ML IV SOLN
INTRAVENOUS | Status: DC | PRN
  Administered 2016-09-19: 100 mg via INTRAVENOUS

## 2016-09-19 MED ORDER — SCOPOLAMINE 1 MG/3DAYS TD PT72
MEDICATED_PATCH | TRANSDERMAL | Status: AC
  Administered 2016-09-19: 1.5 mg
  Filled 2016-09-19: qty 1

## 2016-09-19 MED ORDER — ONDANSETRON HCL 4 MG/2ML IJ SOLN
INTRAMUSCULAR | Status: AC
  Filled 2016-09-19: qty 2

## 2016-09-19 MED ORDER — ACETAMINOPHEN 325 MG PO TABS: 650.0000 mg | ORAL_TABLET | Freq: Four times a day (QID) | ORAL | Status: DC | PRN

## 2016-09-19 MED ORDER — 0.9 % SODIUM CHLORIDE (POUR BTL) OPTIME
TOPICAL | Status: DC | PRN
  Administered 2016-09-19: 1000 mL

## 2016-09-19 MED ORDER — CHLORHEXIDINE GLUCONATE CLOTH 2 % EX PADS: 6.0000 | MEDICATED_PAD | Freq: Once | CUTANEOUS | Status: DC

## 2016-09-19 MED ORDER — DIPHENHYDRAMINE HCL 50 MG/ML IJ SOLN
INTRAMUSCULAR | Status: DC | PRN
  Administered 2016-09-19: 25 mg via INTRAVENOUS

## 2016-09-19 MED ORDER — ACETAMINOPHEN 650 MG RE SUPP
650.0000 mg | Freq: Four times a day (QID) | RECTAL | Status: DC | PRN
Start: 2016-09-19 — End: 2016-09-20

## 2016-09-19 MED ORDER — LACTATED RINGERS IV SOLN
INTRAVENOUS | Status: DC | PRN
  Administered 2016-09-19 (×2): via INTRAVENOUS

## 2016-09-19 MED ORDER — MIDAZOLAM HCL 2 MG/2ML IJ SOLN
INTRAMUSCULAR | Status: AC
  Filled 2016-09-19: qty 2

## 2016-09-19 MED ORDER — PROPOFOL 10 MG/ML IV BOLUS
INTRAVENOUS | Status: DC | PRN
  Administered 2016-09-19: 160 mg via INTRAVENOUS

## 2016-09-19 MED ORDER — HEMOSTATIC AGENTS (NO CHARGE) OPTIME
TOPICAL | Status: DC | PRN
  Administered 2016-09-19: 1 via TOPICAL

## 2016-09-19 MED ORDER — FENTANYL CITRATE (PF) 100 MCG/2ML IJ SOLN
INTRAMUSCULAR | Status: AC
  Filled 2016-09-19: qty 2

## 2016-09-19 MED ORDER — CALCIUM CARBONATE 1250 (500 CA) MG PO TABS
2.0000 | ORAL_TABLET | Freq: Three times a day (TID) | ORAL | Status: DC
  Administered 2016-09-19 – 2016-09-20 (×2): 1000 mg via ORAL
  Filled 2016-09-19 (×2): qty 1

## 2016-09-19 MED ORDER — HYDROCODONE-ACETAMINOPHEN 5-325 MG PO TABS
1.0000 | ORAL_TABLET | ORAL | Status: DC | PRN
  Administered 2016-09-19 – 2016-09-20 (×4): 2 via ORAL
  Filled 2016-09-19 (×4): qty 2

## 2016-09-19 MED ORDER — HYDROMORPHONE HCL 1 MG/ML IJ SOLN
INTRAMUSCULAR | Status: DC | PRN
  Administered 2016-09-19 (×2): 0.5 mg via INTRAVENOUS

## 2016-09-19 MED ORDER — ROCURONIUM BROMIDE 50 MG/5ML IV SOSY
PREFILLED_SYRINGE | INTRAVENOUS | Status: AC
  Filled 2016-09-19: qty 5

## 2016-09-19 MED ORDER — ROCURONIUM BROMIDE 100 MG/10ML IV SOLN
INTRAVENOUS | Status: DC | PRN
  Administered 2016-09-19: 50 mg via INTRAVENOUS
  Administered 2016-09-19: 30 mg via INTRAVENOUS

## 2016-09-19 MED ORDER — LIDOCAINE 2% (20 MG/ML) 5 ML SYRINGE
INTRAMUSCULAR | Status: AC
  Filled 2016-09-19: qty 5

## 2016-09-19 MED ORDER — HYDROMORPHONE HCL 1 MG/ML IJ SOLN
0.2500 mg | INTRAMUSCULAR | Status: DC | PRN
  Administered 2016-09-19 (×3): 0.5 mg via INTRAVENOUS

## 2016-09-19 MED ORDER — DEXAMETHASONE SODIUM PHOSPHATE 10 MG/ML IJ SOLN
INTRAMUSCULAR | Status: DC | PRN
  Administered 2016-09-19: 10 mg via INTRAVENOUS

## 2016-09-19 MED ORDER — SUCCINYLCHOLINE CHLORIDE 200 MG/10ML IV SOSY
PREFILLED_SYRINGE | INTRAVENOUS | Status: AC
  Filled 2016-09-19: qty 10

## 2016-09-19 MED ORDER — ONDANSETRON HCL 4 MG/2ML IJ SOLN
4.0000 mg | Freq: Four times a day (QID) | INTRAMUSCULAR | Status: DC | PRN
  Administered 2016-09-19 (×2): 4 mg via INTRAVENOUS
  Filled 2016-09-19 (×2): qty 2

## 2016-09-19 MED ORDER — LOSARTAN POTASSIUM 50 MG PO TABS
100.0000 mg | ORAL_TABLET | Freq: Every day | ORAL | Status: DC
  Administered 2016-09-19 – 2016-09-20 (×2): 100 mg via ORAL
  Filled 2016-09-19 (×2): qty 2

## 2016-09-19 MED ORDER — KCL IN DEXTROSE-NACL 20-5-0.45 MEQ/L-%-% IV SOLN
INTRAVENOUS | Status: DC
  Administered 2016-09-19: 23:00:00 via INTRAVENOUS
  Filled 2016-09-19: qty 1000

## 2016-09-19 MED ORDER — HYDROMORPHONE HCL 1 MG/ML IJ SOLN
INTRAMUSCULAR | Status: AC
  Administered 2016-09-19: 0.5 mg via INTRAVENOUS
  Filled 2016-09-19: qty 1

## 2016-09-19 MED ORDER — HYDROMORPHONE HCL 1 MG/ML IJ SOLN
INTRAMUSCULAR | Status: AC
  Filled 2016-09-19: qty 1

## 2016-09-19 MED ORDER — PROMETHAZINE HCL 25 MG/ML IJ SOLN: 6.2500 mg | INTRAMUSCULAR | Status: DC | PRN

## 2016-09-19 MED ORDER — BUPROPION HCL ER (XL) 150 MG PO TB24
300.0000 mg | ORAL_TABLET | Freq: Every day | ORAL | Status: DC
  Administered 2016-09-20: 300 mg via ORAL
  Filled 2016-09-19: qty 2

## 2016-09-19 MED ORDER — HYDROMORPHONE HCL 1 MG/ML IJ SOLN
INTRAMUSCULAR | Status: AC
  Filled 2016-09-19: qty 0.5

## 2016-09-19 MED ORDER — DIPHENHYDRAMINE HCL 50 MG/ML IJ SOLN
INTRAMUSCULAR | Status: AC
  Filled 2016-09-19: qty 1

## 2016-09-19 MED ORDER — ONDANSETRON HCL 4 MG/2ML IJ SOLN
INTRAMUSCULAR | Status: DC | PRN
Start: 2016-09-19 — End: 2016-09-19
  Administered 2016-09-19: 4 mg via INTRAVENOUS

## 2016-09-19 MED ORDER — HYDROMORPHONE HCL 2 MG/ML IJ SOLN
1.0000 mg | INTRAMUSCULAR | Status: DC | PRN
  Administered 2016-09-19 (×2): 1 mg via INTRAVENOUS
  Filled 2016-09-19 (×2): qty 1

## 2016-09-19 MED ORDER — SUGAMMADEX SODIUM 200 MG/2ML IV SOLN
INTRAVENOUS | Status: DC | PRN
  Administered 2016-09-19: 150 mg via INTRAVENOUS

## 2016-09-19 MED ORDER — PHENYLEPHRINE HCL 10 MG/ML IJ SOLN
INTRAMUSCULAR | Status: DC | PRN
  Administered 2016-09-19: 80 ug via INTRAVENOUS
  Administered 2016-09-19: 40 ug via INTRAVENOUS

## 2016-09-19 MED ORDER — ONDANSETRON 4 MG PO TBDP: 4.0000 mg | ORAL_TABLET | Freq: Four times a day (QID) | ORAL | Status: DC | PRN

## 2016-09-19 SURGICAL SUPPLY — 49 items
ATTRACTOMAT 16X20 MAGNETIC DRP (DRAPES) ×3 IMPLANT
BLADE SURG 10 STRL SS (BLADE) ×1 IMPLANT
BLADE SURG 15 STRL LF DISP TIS (BLADE) ×1 IMPLANT
BLADE SURG 15 STRL SS (BLADE) ×3
BLADE SURG ROTATE 9660 (MISCELLANEOUS) IMPLANT
CANISTER SUCTION 2500CC (MISCELLANEOUS) ×3 IMPLANT
CHLORAPREP W/TINT 10.5 ML (MISCELLANEOUS) ×3 IMPLANT
CLIP TI MEDIUM 24 (CLIP) ×3 IMPLANT
CLIP TI WIDE RED SMALL 24 (CLIP) ×3 IMPLANT
CLOSURE WOUND 1/2 X4 (GAUZE/BANDAGES/DRESSINGS) ×1
COVER SURGICAL LIGHT HANDLE (MISCELLANEOUS) ×3 IMPLANT
CRADLE DONUT ADULT HEAD (MISCELLANEOUS) ×3 IMPLANT
DRAPE LAPAROTOMY 100X72 PEDS (DRAPES) ×3 IMPLANT
DRAPE UTILITY XL STRL (DRAPES) ×3 IMPLANT
ELECT CAUTERY BLADE 6.4 (BLADE) ×3 IMPLANT
ELECT REM PT RETURN 9FT ADLT (ELECTROSURGICAL) ×3
ELECTRODE REM PT RTRN 9FT ADLT (ELECTROSURGICAL) ×1 IMPLANT
GAUZE SPONGE 4X4 12PLY STRL (GAUZE/BANDAGES/DRESSINGS) ×3 IMPLANT
GAUZE SPONGE 4X4 16PLY XRAY LF (GAUZE/BANDAGES/DRESSINGS) ×6 IMPLANT
GLOVE BIOGEL PI IND STRL 7.0 (GLOVE) IMPLANT
GLOVE BIOGEL PI INDICATOR 7.0 (GLOVE) ×2
GLOVE SURG ORTHO 8.0 STRL STRW (GLOVE) ×3 IMPLANT
GLOVE SURG SS PI 7.0 STRL IVOR (GLOVE) ×6 IMPLANT
GOWN STRL REUS W/ TWL LRG LVL3 (GOWN DISPOSABLE) ×1 IMPLANT
GOWN STRL REUS W/ TWL XL LVL3 (GOWN DISPOSABLE) ×1 IMPLANT
GOWN STRL REUS W/TWL LRG LVL3 (GOWN DISPOSABLE) ×3
GOWN STRL REUS W/TWL XL LVL3 (GOWN DISPOSABLE) ×6
HEMOSTAT SURGICEL 2X4 FIBR (HEMOSTASIS) ×3 IMPLANT
ILLUMINATOR WAVEGUIDE N/F (MISCELLANEOUS) IMPLANT
KIT BASIN OR (CUSTOM PROCEDURE TRAY) ×3 IMPLANT
KIT ROOM TURNOVER OR (KITS) ×3 IMPLANT
LIGHT WAVEGUIDE WIDE FLAT (MISCELLANEOUS) ×3 IMPLANT
NS IRRIG 1000ML POUR BTL (IV SOLUTION) ×3 IMPLANT
PACK SURGICAL SETUP 50X90 (CUSTOM PROCEDURE TRAY) ×3 IMPLANT
PAD ARMBOARD 7.5X6 YLW CONV (MISCELLANEOUS) ×3 IMPLANT
PENCIL BUTTON HOLSTER BLD 10FT (ELECTRODE) ×3 IMPLANT
SHEARS HARMONIC 9CM CVD (BLADE) ×3 IMPLANT
SPECIMEN JAR MEDIUM (MISCELLANEOUS) ×3 IMPLANT
SPONGE INTESTINAL PEANUT (DISPOSABLE) ×3 IMPLANT
STRIP CLOSURE SKIN 1/2X4 (GAUZE/BANDAGES/DRESSINGS) ×2 IMPLANT
SUT MNCRL AB 4-0 PS2 18 (SUTURE) ×3 IMPLANT
SUT SILK 2 0 (SUTURE) ×3
SUT SILK 2-0 18XBRD TIE 12 (SUTURE) ×1 IMPLANT
SUT VIC AB 3-0 SH 18 (SUTURE) ×6 IMPLANT
SYR BULB 3OZ (MISCELLANEOUS) ×3 IMPLANT
TOWEL OR 17X24 6PK STRL BLUE (TOWEL DISPOSABLE) ×3 IMPLANT
TOWEL OR 17X26 10 PK STRL BLUE (TOWEL DISPOSABLE) ×3 IMPLANT
TUBE CONNECTING 12'X1/4 (SUCTIONS) ×1
TUBE CONNECTING 12X1/4 (SUCTIONS) ×2 IMPLANT

## 2016-09-19 NOTE — Op Note (Signed)
Procedure Note  Pre-operative Diagnosis:  Multinodular goiter with compressive symptoms  Post-operative Diagnosis:  same  Surgeon:  Velora Hecklerodd M. Khelani Kops, MD, FACS  Assistant:  Myrtie SomanSharon Hitchcock, RNFA   Procedure:  Total thyroidectomy  Anesthesia:  General  Estimated Blood Loss:  minimal  Drains: none         Specimen: thyroid to pathology  Indications:  The patient is a 46 year old female who presents with a complaint of Enlarged thyroid.  Patient is referred by Dr. Dorisann FramesBindubal Balan for evaluation for surgery for management of multinodular thyroid goiter with compressive symptoms. Patient's primary care physician is Dr. Dwana MelenaZack Hall in [place]. Patient was initially diagnosed with multinodular thyroid 15 years ago. She has never been on thyroid medication. She has had no prior head or neck surgery. Patient has developed mild compressive symptoms as the gland has enlarged over the years. She now notes solid food dysphagia. She has difficulty lying flat causing shortness of breath. She has noted some hair loss. She also complains of fatigue. Recent thyroid ultrasound on June 13, 2016 shows an enlarged gland with the right lobe measuring 6.7 cm and left lobe measuring 7.8 cm. There are multiple nodules on the right with the largest measuring 2.8 cm. The largest nodule on the left measures 6.3 cm. All nodules have shown some level of interval enlargement. Thyroid function tests are normal with a TSH level of 0.69. Fine-needle aspiration biopsies were performed on the dominant nodules and were benign, Bethesda category II. Patient now presents to discuss total thyroidectomy.  Procedure Details: Procedure [facility] in [facility].  The patient was brought to the operating room and placed in a supine position on the operating room table.  Following administration of general anesthesia, the patient was positioned and then prepped and draped in the usual aseptic fashion.   After ascertaining that an adequate level of anesthesia had been achieved, a Kocher incision was made with #15 blade.  Dissection was carried through subcutaneous tissues and platysma. Hemostasis was achieved with the electrocautery.  Skin flaps were elevated cephalad and caudad from the thyroid notch to the sternal notch.  The Mahorner self-retaining retractor was placed for exposure.  Strap muscles were incised in the midline and dissection was begun on the left side.  Strap muscles were reflected laterally.  Left thyroid lobe was markedly enlarged with a dominant heterogeneous nodule.  The left lobe was gently mobilized with blunt dissection.  Superior pole vessels were dissected out and divided individually between small and medium Ligaclips with the Harmonic scalpel.  The thyroid lobe was rolled anteriorly.  Branches of the inferior thyroid artery were divided between small Ligaclips with the Harmonic scalpel.  Inferior venous tributaries were divided between Ligaclips.  Both the superior and inferior parathyroid glands were identified and preserved on their vascular pedicles.  The recurrent laryngeal nerve was identified and preserved along its course.  The ligament of Allyson SabalBerry was released with the electrocautery and the gland was mobilized onto the anterior trachea. Isthmus was mobilized across the midline.  There was no pyramidal lobe present.  Dry pack was placed in the left neck.  Next, the right thyroid lobe was gently mobilized with blunt dissection.  Right thyroid lobe was mildly enlarged and multinodular.  Superior pole vessels were dissected out and divided between small and medium Ligaclips with the Harmonic scalpel.  Superior parathyroid was identified and preserved.  Inferior venous tributaries were divided between medium Ligaclips with the  Harmonic scalpel.  The right thyroid lobe was rolled anteriorly and the branches of the inferior thyroid artery divided between small Ligaclips.  The right  recurrent laryngeal nerve was identified and preserved along its course.  The ligament of Allyson Sabal was released with the electrocautery.  The right thyroid lobe was mobilized onto the anterior trachea and the remainder of the thyroid was dissected off the anterior trachea and the thyroid was completely excised.  A suture was used to mark the left lobe. The entire thyroid gland was submitted to pathology for review.  The neck was irrigated with warm saline.  Fibrillar was placed throughout the operative field.  Strap muscles were reapproximated in the midline with interrupted 3-0 Vicryl sutures.  Platysma was closed with interrupted 3-0 Vicryl sutures.  Skin was closed with a running 4-0 Monocryl subcuticular suture.  Wound was washed and dried and steri-strips were applied.  Dry gauze dressing was placed.  The patient was awakened from anesthesia and brought to the recovery room.  The patient tolerated the procedure well.   Velora Heckler, MD, FACS General & Endocrine Surgery [facility] Office: [phone]

## 2016-09-19 NOTE — Transfer of Care (Signed)
Immediate Anesthesia Transfer of Care Note  Patient: Doris Davis  Procedure(s) Performed: Procedure(s): TOTAL THYROIDECTOMY (N/A)  Patient Location: PACU  Anesthesia Type:General  Level of Consciousness: awake, alert  and oriented  Airway & Oxygen Therapy: Patient Spontanous Breathing and Patient connected to nasal cannula oxygen  Post-op Assessment: Report given to RN, Post -op Vital signs reviewed and stable and Patient moving all extremities  Post vital signs: Reviewed  Last Vitals:  Vitals:   09/19/16 0619  BP: (!) 157/112  Pulse: 79  Resp: 20  Temp: 36.5 C    Last Pain:  Vitals:   09/19/16 0619  TempSrc: Oral      Patients Stated Pain Goal: 6 (09/19/16 [id number])  Complications: No apparent anesthesia complications

## 2016-09-19 NOTE — Progress Notes (Signed)
RN paged MD due to patient having a blood pressure of 160/110. Per MD will will give an additional dose of pain medication and monitor. RN will continue to monitor.

## 2016-09-19 NOTE — Interval H&P Note (Signed)
History and Physical Interval Note:  09/19/2016 7:14 AM  Doris Davis  has presented today for surgery, with the diagnosis of multinodular thyroid goiter.  The various methods of treatment have been discussed with the patient and family. After consideration of risks, benefits and other options for treatment, the patient has consented to    Procedure(s): TOTAL THYROIDECTOMY (N/A) as a surgical intervention .    The patient's history has been reviewed, patient examined, no change in status, stable for surgery.  I have reviewed the patient's chart and labs.  Questions were answered to the patient's satisfaction.    Velora Hecklerodd M. Gerson Fauth, MD, FACS General & Endocrine Surgery [facility] Office: [phone]    Doris Davis

## 2016-09-19 NOTE — Anesthesia Postprocedure Evaluation (Signed)
Anesthesia Post Note  Patient: Doris Davis  Procedure(s) Performed: Procedure(s) (LRB): TOTAL THYROIDECTOMY (N/A)  Patient location during evaluation: PACU Anesthesia Type: General Level of consciousness: awake and alert Pain management: pain level controlled Vital Signs Assessment: post-procedure vital signs reviewed and stable Respiratory status: spontaneous breathing, nonlabored ventilation, respiratory function stable and patient connected to nasal cannula oxygen Cardiovascular status: blood pressure returned to baseline and stable Postop Assessment: no signs of nausea or vomiting Anesthetic complications: no       Last Vitals:  Vitals:   09/19/16 1200 09/19/16 1215  BP: (!) 155/102   Pulse: 88 89  Resp: (!) 27   Temp: 36.7 C     Last Pain:  Vitals:   09/19/16 1200  TempSrc:   PainSc: Asleep                 [name],JAMES TERRILL

## 2016-09-19 NOTE — Progress Notes (Signed)
Patient arrived to the floor via bed. VSS-blood pressure noted to be 160/110. MD made aware. Dressing clean, dry and intact. IV intact. Patient is Alert and oriented X4. Will continue to monitor.

## 2016-09-19 NOTE — Anesthesia Preprocedure Evaluation (Addendum)
Anesthesia Evaluation  Patient identified by MRN, date of birth, ID band Patient awake    Reviewed: Allergy & Precautions, NPO status   History of Anesthesia Complications (+) PONV  Airway Mallampati: I  TM Distance: >3 FB Neck ROM: Full    Dental  (+) Teeth Intact, Dental Advisory Given   Pulmonary Current Smoker,    breath sounds clear to auscultation       Cardiovascular hypertension,  Rhythm:Regular Rate:Normal     Neuro/Psych    GI/Hepatic negative GI ROS, Neg liver ROS,   Endo/Other    Renal/GU negative Renal ROS     Musculoskeletal  (+) Arthritis ,   Abdominal   Peds  Hematology   Anesthesia Other Findings   Reproductive/Obstetrics                            Anesthesia Physical Anesthesia Plan  ASA: II  Anesthesia Plan: General   Post-op Pain Management:    Induction: Intravenous  Airway Management Planned: Oral ETT  Additional Equipment:   Intra-op Plan:   Post-operative Plan: Extubation in OR  Informed Consent: I have reviewed the patients History and Physical, chart, labs and discussed the procedure including the risks, benefits and alternatives for the proposed anesthesia with the patient or authorized representative who has indicated his/her understanding and acceptance.   Dental advisory given  Plan Discussed with: CRNA  Anesthesia Plan Comments:         Anesthesia Quick Evaluation

## 2016-09-19 NOTE — Anesthesia Procedure Notes (Signed)
Procedure Name: Intubation Date/Time: 09/19/2016 7:38 AM Performed by: Rica Koyanagi Pre-anesthesia Checklist: Patient identified, Emergency Drugs available, Suction available, Patient being monitored and Timeout performed Patient Re-evaluated:Patient Re-evaluated prior to inductionOxygen Delivery Method: Circle System Utilized Preoxygenation: Pre-oxygenation with 100% oxygen Intubation Type: IV induction Ventilation: Mask ventilation without difficulty Laryngoscope Size: Mac and 4 Grade View: Grade I Tube type: Oral Tube size: 7.0 mm Number of attempts: 1 Airway Equipment and Method: Stylet and Oral airway Placement Confirmation: ETT inserted through vocal cords under direct vision,  positive ETCO2 and breath sounds checked- equal and bilateral Secured at: 22 cm Tube secured with: Tape Dental Injury: Teeth and Oropharynx as per pre-operative assessment

## 2016-09-20 ENCOUNTER — Encounter (HOSPITAL_COMMUNITY): Payer: Self-pay | Admitting: Surgery

## 2016-09-20 DIAGNOSIS — E042 Nontoxic multinodular goiter: Secondary | ICD-10-CM | POA: Diagnosis not present

## 2016-09-20 LAB — BASIC METABOLIC PANEL
Anion gap: 10 (ref 5–15)
BUN: 8 mg/dL (ref 6–20)
CO2: 26 mmol/L (ref 22–32)
CREATININE: 0.69 mg/dL (ref 0.44–1.00)
Calcium: 10.2 mg/dL (ref 8.9–10.3)
Chloride: 104 mmol/L (ref 101–111)
GFR calc Af Amer: >60 mL/min (ref >60)
GFR calc non Af Amer: >60 mL/min (ref >60)
GLUCOSE: 115 mg/dL — AB (ref 65–99)
POTASSIUM: 3.9 mmol/L (ref 3.5–5.1)
Sodium: 140 mmol/L (ref 135–145)

## 2016-09-20 MED ORDER — CALCIUM CARBONATE ANTACID 500 MG PO CHEW: 2.0000 | CHEWABLE_TABLET | Freq: Two times a day (BID) | ORAL | 1 refills | Status: DC

## 2016-09-20 MED ORDER — HYDROCODONE-ACETAMINOPHEN 5-325 MG PO TABS: 1.0000 | ORAL_TABLET | ORAL | 0 refills | Status: DC | PRN

## 2016-09-20 MED ORDER — LEVOTHYROXINE SODIUM 88 MCG PO TABS: 88.0000 ug | ORAL_TABLET | Freq: Every day | ORAL | 3 refills | Status: DC

## 2016-09-20 NOTE — Discharge Summary (Signed)
Physician Discharge Summary [facility]  Patient ID: Doris Davis MRN: [id number] DOB/AGE: 1971-07-22 46 y.o.  Admit date: 09/19/2016 Discharge date: 09/20/2016  Admission Diagnoses:  Multinodular thyroid goiter  Discharge Diagnoses:  Principal Problem:   Multiple thyroid nodules Active Problems:   Multinodular goiter (nontoxic)   Discharged Condition: good  Hospital Course: Patient was admitted for observation following thyroid surgery.  Post op course was uncomplicated.  Pain was well controlled.  Tolerated diet.  Post op calcium level on morning following surgery was 10.2 mg/dl.  Patient was prepared for discharge home on POD#1.  Consults: None  Treatments: surgery: total thyroidectomy  Discharge Exam: Blood pressure 136/88, pulse 63, temperature 98 F (36.7 C), temperature source Oral, resp. rate 17, height 5\' 3"  (1.6 m), weight 80.7 kg (178 lb), last menstrual period 07/10/2011, SpO2 98 %. HEENT - clear Neck - wound dry and intact; mild STS; voice normal Chest - clear bilaterally Cor - RRR  Disposition: Home  Discharge Instructions    Apply dressing    Complete by:  As directed    Apply light gauze dressing to wound before discharge home today.   Diet - low sodium heart healthy    Complete by:  As directed    Discharge instructions    Complete by:  As directed    [facility]  THYROID & PARATHYROID SURGERY:  POST-OP INSTRUCTIONS  Always review your discharge instruction sheet from the facility where your surgery was performed.  A prescription for pain medication may be given to you upon discharge.  Take your pain medication as prescribed.  If narcotic pain medicine is not needed, then you may take acetaminophen (Tylenol) or ibuprofen (Advil) as needed.  Take your usually prescribed medications unless otherwise directed.  If you need a refill on your pain medication, please contact our office during regular business hours.   Prescriptions will not be processed by our office after 5 pm or on weekends.  Start with a light diet upon arrival home, such as soup and crackers or toast.  Be sure to drink plenty of fluids daily.  Resume your normal diet the day after surgery.  Most patients will experience some swelling and bruising on the chest and neck area.  Ice packs will help.  Swelling and bruising can take several days to resolve.   It is common to experience some constipation after surgery.  Increasing fluid intake and taking a stool softener (Colace) will usually help or prevent this problem.  A mild laxative (Milk of Magnesia or Miralax) should be taken according to package directions if there has been no bowel movement after 48 hours.  You have steri-strips and a gauze dressing over your incision.  You may remove the gauze bandage on the second day after surgery, and you may shower at that time.  Leave your steri-strips (small skin tapes) in place directly over the incision.  These strips should remain on the skin for 5-7 days and then be removed.  You may get them wet in the shower and pat them dry.  You may resume regular (light) daily activities beginning the next day - such as daily self-care, walking, climbing stairs - gradually increasing activities as tolerated.  You may have sexual intercourse when it is comfortable.  Refrain from any heavy lifting or straining until approved by your doctor.  You may drive when you no longer are taking prescription pain medication, you can comfortably wear a seatbelt, and you  can safely maneuver your car and apply brakes.  You should see your doctor in the office for a follow-up appointment approximately three weeks after your surgery.  Make sure that you call for this appointment within a day or two after you arrive home to insure a convenient appointment time.  WHEN TO CALL YOUR DOCTOR: -- Fever greater than 101.5 -- Inability to urinate -- Nausea and/or vomiting -  persistent -- Extreme swelling or bruising -- Continued bleeding from incision -- Increased pain, redness, or drainage from the incision -- Difficulty swallowing or breathing -- Muscle cramping or spasms -- Numbness or tingling in hands or around lips  The clinic staff is available to answer your questions during regular business hours.  Please don't hesitate to call and ask to speak to one of the nurses if you have concerns.  Velora Heckler, MD, FACS General & Endocrine Surgery [facility] Office: [phone]  Website: [email or web address]   Increase activity slowly    Complete by:  As directed    Remove dressing in 24 hours    Complete by:  As directed      Allergies as of 09/20/2016      Reactions   Celexa [citalopram Hydrobromide] Anaphylaxis   Coconut Oil Anaphylaxis   Penicillins Anaphylaxis   Has patient had a PCN reaction causing immediate rash, facial/tongue/throat swelling, SOB or lightheadedness with hypotension: Yes Has patient had a PCN reaction causing severe rash involving mucus membranes or skin necrosis: No Has patient had a PCN reaction that required hospitalization No Has patient had a PCN reaction occurring within the last 10 years: No If all of the above answers are "NO", then may proceed with Cephalosporin use.      Medication List    TAKE these medications   ALIGN PO Take 1 tablet by mouth daily. OTC med to help w/ IBS   ALPRAZolam 0.5 MG tablet Commonly known as:  XANAX Take 0.5 mg by mouth daily as needed for anxiety.   buPROPion 300 MG 24 hr tablet Commonly known as:  WELLBUTRIN XL Take 300 mg by mouth daily.   calcium carbonate 500 MG chewable tablet Commonly known as:  TUMS Chew 2 tablets (400 mg of elemental calcium total) by mouth 2 (two) times daily.   EPINEPHrine 0.3 mg/0.3 mL Soaj injection Commonly known as:  EPI-PEN Inject 0.3 mg into the muscle once.   HYDROcodone-acetaminophen 5-325 MG  tablet Commonly known as:  NORCO/VICODIN Take 2 tablets by mouth every 4 (four) hours as needed. What changed:  Another medication with the same name was added. Make sure you understand how and when to take each.   HYDROcodone-acetaminophen 5-325 MG tablet Commonly known as:  NORCO/VICODIN Take 1-2 tablets by mouth every 4 (four) hours as needed for moderate pain. What changed:  You were already taking a medication with the same name, and this prescription was added. Make sure you understand how and when to take each.   ibuprofen 200 MG tablet Commonly known as:  ADVIL,MOTRIN Take 400 mg by mouth every 6 (six) hours as needed for mild pain.   levothyroxine 88 MCG tablet Commonly known as:  SYNTHROID Take 1 tablet (88 mcg total) by mouth daily before breakfast.   losartan 100 MG tablet Commonly known as:  COZAAR Take 1 tablet by mouth daily.   tobramycin 0.3 % ophthalmic solution Commonly known as:  TOBREX Place 2 drops into the right eye every 4 (four) hours.  Follow-up Information    Tanda Morrissey M, MD. Schedule an appointment as soon as possible for a visit in 3 week(s).   Specialty:  General Surgery Contact information: [address] [place] [address] [phone]           Velora Hecklerodd M. Brihany Butch, MD, [facility] Office: [phone]   Signed: Velora HecklerGERKIN,Michall Noffke M 09/20/2016, 11:02 AM

## 2016-09-23 NOTE — Progress Notes (Signed)
Please contact patient and notify of benign pathology results.  Catha Ontko M. Takeila Thayne, MD, FACS [facility] Office: [phone]   

## 2017-04-15 ENCOUNTER — Emergency Department (HOSPITAL_COMMUNITY): Payer: Managed Care, Other (non HMO)

## 2017-04-15 ENCOUNTER — Encounter (HOSPITAL_COMMUNITY): Payer: Self-pay

## 2017-04-15 ENCOUNTER — Emergency Department (HOSPITAL_COMMUNITY)
Admission: EM | Admit: 2017-04-15 | Discharge: 2017-04-15 | Disposition: A | Discharge status: Home / Self Care | Payer: Managed Care, Other (non HMO) | Attending: Emergency Medicine | Admitting: Emergency Medicine

## 2017-04-15 ENCOUNTER — Emergency Department (HOSPITAL_COMMUNITY)
Admission: EM | Admit: 2017-04-15 | Discharge: 2017-04-15 | Disposition: A | Discharge status: Home / Self Care | Payer: Managed Care, Other (non HMO) | Source: Home / Self Care

## 2017-04-15 DIAGNOSIS — R091 Pleurisy: Secondary | ICD-10-CM

## 2017-04-15 DIAGNOSIS — R079 Chest pain, unspecified: Secondary | ICD-10-CM | POA: Insufficient documentation

## 2017-04-15 DIAGNOSIS — J45909 Unspecified asthma, uncomplicated: Secondary | ICD-10-CM | POA: Insufficient documentation

## 2017-04-15 DIAGNOSIS — Z79899 Other long term (current) drug therapy: Secondary | ICD-10-CM | POA: Insufficient documentation

## 2017-04-15 DIAGNOSIS — F17210 Nicotine dependence, cigarettes, uncomplicated: Secondary | ICD-10-CM | POA: Diagnosis not present

## 2017-04-15 DIAGNOSIS — I10 Essential (primary) hypertension: Secondary | ICD-10-CM | POA: Diagnosis not present

## 2017-04-15 DIAGNOSIS — Z5321 Procedure and treatment not carried out due to patient leaving prior to being seen by health care provider: Secondary | ICD-10-CM | POA: Insufficient documentation

## 2017-04-15 DIAGNOSIS — R0781 Pleurodynia: Secondary | ICD-10-CM | POA: Diagnosis present

## 2017-04-15 LAB — CBC WITH DIFFERENTIAL/PLATELET
BASOS ABS: 0 10*3/uL (ref 0.0–0.1)
BASOS PCT: 0 %
Eosinophils Absolute: 0.2 10*3/uL (ref 0.0–0.7)
Eosinophils Relative: 3 %
HEMATOCRIT: 38.1 % (ref 36.0–46.0)
Hemoglobin: 12.5 g/dL (ref 12.0–15.0)
LYMPHS PCT: 36 %
Lymphs Abs: 3.2 10*3/uL (ref 0.7–4.0)
MCH: 28.5 pg (ref 26.0–34.0)
MCHC: 32.8 g/dL (ref 30.0–36.0)
MCV: 86.8 fL (ref 78.0–100.0)
MONO ABS: 0.6 10*3/uL (ref 0.1–1.0)
Monocytes Relative: 7 %
Neutro Abs: 4.8 10*3/uL (ref 1.7–7.7)
Neutrophils Relative %: 54 %
Platelets: 264 10*3/uL (ref 150–400)
RBC: 4.39 MIL/uL (ref 3.87–5.11)
RDW: 14.3 % (ref 11.5–15.5)
WBC: 8.8 10*3/uL (ref 4.0–10.5)

## 2017-04-15 LAB — CBC
HEMATOCRIT: 36.3 % (ref 36.0–46.0)
HEMOGLOBIN: 11.5 g/dL — AB (ref 12.0–15.0)
MCH: 27.1 pg (ref 26.0–34.0)
MCHC: 31.7 g/dL (ref 30.0–36.0)
MCV: 85.6 fL (ref 78.0–100.0)
Platelets: 229 10*3/uL (ref 150–400)
RBC: 4.24 MIL/uL (ref 3.87–5.11)
RDW: 14.3 % (ref 11.5–15.5)
WBC: 6.4 10*3/uL (ref 4.0–10.5)

## 2017-04-15 LAB — COMPREHENSIVE METABOLIC PANEL
ALBUMIN: 4.4 g/dL (ref 3.5–5.0)
ALK PHOS: 87 U/L (ref 38–126)
ALT: 26 U/L (ref 14–54)
AST: 21 U/L (ref 15–41)
Anion gap: 10 (ref 5–15)
BILIRUBIN TOTAL: 0.5 mg/dL (ref 0.3–1.2)
BUN: 17 mg/dL (ref 6–20)
CO2: 22 mmol/L (ref 22–32)
CREATININE: 0.77 mg/dL (ref 0.44–1.00)
Calcium: 8.9 mg/dL (ref 8.9–10.3)
Chloride: 99 mmol/L — ABNORMAL LOW (ref 101–111)
GFR calc Af Amer: >60 mL/min (ref >60)
Glucose, Bld: 98 mg/dL (ref 65–99)
POTASSIUM: 3.5 mmol/L (ref 3.5–5.1)
Sodium: 131 mmol/L — ABNORMAL LOW (ref 135–145)
TOTAL PROTEIN: 7.9 g/dL (ref 6.5–8.1)

## 2017-04-15 LAB — BASIC METABOLIC PANEL
ANION GAP: 10 (ref 5–15)
BUN: 17 mg/dL (ref 6–20)
CO2: 23 mmol/L (ref 22–32)
Calcium: 9.2 mg/dL (ref 8.9–10.3)
Chloride: 103 mmol/L (ref 101–111)
Creatinine, Ser: 0.77 mg/dL (ref 0.44–1.00)
GLUCOSE: 96 mg/dL (ref 65–99)
POTASSIUM: 3.7 mmol/L (ref 3.5–5.1)
Sodium: 136 mmol/L (ref 135–145)

## 2017-04-15 LAB — D-DIMER, QUANTITATIVE (NOT AT ARMC): D DIMER QUANT: 0.43 ug{FEU}/mL (ref 0.00–0.50)

## 2017-04-15 LAB — I-STAT TROPONIN, ED: TROPONIN I, POC: 0 ng/mL (ref 0.00–0.08)

## 2017-04-15 LAB — TROPONIN I: Troponin I: <0.03 ng/mL (ref <0.03)

## 2017-04-15 MED ORDER — IBUPROFEN 600 MG PO TABS: 600.0000 mg | ORAL_TABLET | Freq: Four times a day (QID) | ORAL | 0 refills | Status: DC | PRN

## 2017-04-15 MED ORDER — HYDROCODONE-ACETAMINOPHEN 5-325 MG PO TABS: 2.0000 | ORAL_TABLET | ORAL | 0 refills | Status: DC | PRN

## 2017-04-15 MED ORDER — OXYCODONE-ACETAMINOPHEN 5-325 MG PO TABS
ORAL_TABLET | ORAL | Status: AC
  Filled 2017-04-15: qty 1

## 2017-04-15 MED ORDER — HYDROMORPHONE HCL 1 MG/ML IJ SOLN
1.0000 mg | Freq: Once | INTRAMUSCULAR | Status: AC
  Administered 2017-04-15: 1 mg via INTRAVENOUS
  Filled 2017-04-15: qty 1

## 2017-04-15 MED ORDER — OXYCODONE-ACETAMINOPHEN 5-325 MG PO TABS
1.0000 | ORAL_TABLET | ORAL | Status: DC | PRN
  Administered 2017-04-15: 1 via ORAL

## 2017-04-15 MED ORDER — DIAZEPAM 5 MG PO TABS
5.0000 mg | ORAL_TABLET | Freq: Once | ORAL | Status: AC
  Administered 2017-04-15: 5 mg via ORAL
  Filled 2017-04-15: qty 1

## 2017-04-15 MED ORDER — KETOROLAC TROMETHAMINE 30 MG/ML IJ SOLN
30.0000 mg | Freq: Once | INTRAMUSCULAR | Status: AC
  Administered 2017-04-15: 30 mg via INTRAVENOUS
  Filled 2017-04-15: qty 1

## 2017-04-15 NOTE — ED Triage Notes (Signed)
Pt states left side flank pain that radiates into her chest and her neck that began 2 days ago. Pt states the pain makes it hard to take a deep breath. Pt also reports diaphoresis. Skin warm and dry in triage but pt appears very uncomfortable.

## 2017-04-15 NOTE — ED Notes (Signed)
Patient was upset about wait time, patient opted to leave and seek treatment elsewhere.

## 2017-04-15 NOTE — ED Triage Notes (Signed)
[facility]. Pt came here due to long wait time there. Is complaining of sharp pain on left flank. States it is a 10/10. Pt breathing heavily upon arrival.  A&O

## 2017-04-15 NOTE — Discharge Instructions (Signed)

## 2017-04-15 NOTE — ED Notes (Signed)
Pt ambulatory to waiting room. Pt verbalized understanding of discharge instructions.   

## 2017-04-15 NOTE — ED Provider Notes (Signed)
[facility]PT Provider Note   CSN: [id number] Arrival date & time: 04/15/17  2027     History   Chief Complaint Chief Complaint  Patient presents with  . Back Pain    HPI Doris HammersmithDonna M Davis is a 46 y.o. female.  HPI  The patient is a 46 year old female, she has a history of hypertension, asthma and a history of a recent thyroidectomy back in January of this year. She presents with severe left-sided pleuritic pain that is located in the left midaxillary line at approximately the ninth rib. The patient reports this started in her neck 3 days ago, seems to have spread down her chest and into the left lateral location where it currently is. It is sharp and stabbing, severe, worse with breathing and not position, also worse with palpation over this area. She has shortness of breath because she cannot breathe, denies swelling of her legs, fever, coughing, there is no right-sided pain, there is no abdominal pain, there is no nausea vomiting or diarrhea. She has no history of pulmonary embolism, no history of recent upper respiratory infection, no history of kidney stone and denies any urinary symptoms such as hematuria frequency urgency or dysuria.  Past Medical History:  Diagnosis Date  . Allergy   . Anemia   . Anxiety   . Arthritis   . Asthma    with contact with cats  . BV (bacterial vaginosis) 08/09/2013  . Family history of adverse reaction to anesthesia    mother was sick for days  . Hypertension   . PONV (postoperative nausea and vomiting)    sts with ablation she was given meds for nausea and "got sicker".  . Vaginal bleeding 08/09/2013   Had supra cervical hyst 2012, has had bleeding after sex  . Venereal disease 1990   chlamydia  . Wears glasses     Patient Active Problem List   Diagnosis Date Noted  . Multinodular goiter (nontoxic) 09/19/2016  . Multiple thyroid nodules 09/11/2016  . Obese 03/18/2016  . Smoker 03/18/2016  . Thyroid nodule 03/18/2016  .  Perimenopausal 03/18/2016  . Hypertension 08/09/2013  . Vaginal bleeding 08/09/2013  . BV (bacterial vaginosis) 08/09/2013  . Stress fracture of foot with nonunion 06/02/2013  . Infection of right foot 04/01/2013  . Right foot pain 04/01/2013    Past Surgical History:  Procedure Laterality Date  . ABDOMINAL HYSTERECTOMY    . BREAST SURGERY     lump removed from left breast back in the 1980's  . BUNIONECTOMY  11/14   rt  . CESAREAN SECTION  1995  . ENDOMETRIAL ABLATION  2010   [facility]  . FOOT ARTHROPLASTY  11/15   rt-revision bunionectomy-  . FRACTURE SURGERY  2005   left ankle-[place]  . HAMMER TOE SURGERY Right 12/01/2014   Procedure: REVISION OF RIGHT 3RD HAMMER TOE CORRECTION;  Surgeon: Toni ArthursJohn Hewitt, MD;  Location: [facility];  Service: Orthopedics;  Laterality: Right;  . JOINT REPLACEMENT     big toe joint removed  . LAPAROSCOPIC SUPRACERVICAL HYSTERECTOMY  07/17/2011   Procedure: LAPAROSCOPIC SUPRACERVICAL HYSTERECTOMY;  Surgeon: Lazaro ArmsLuther H Eure, MD;  Location: [facility];  Service: Gynecology;  Laterality: N/A;  . REMOVAL OF IMPLANT Right 12/01/2014   Procedure: REMOVAL OF  DEEP IMPLANT FROM RIGHT 3RD TOE;  Surgeon: Toni ArthursJohn Hewitt, MD;  Location: [facility];  Service: Orthopedics;  Laterality: Right;  . THYROIDECTOMY N/A 09/19/2016   Procedure: TOTAL THYROIDECTOMY;  Surgeon: Doris Levelodd Gerkin, MD;  Location: [facility];  Service: General;  Laterality: N/A;    OB History    Gravida Para Term Preterm AB Living   5 2 1 1 3 1    SAB TAB Ectopic Multiple Live Births   3       2       Home Medications    Prior to Admission medications   Medication Sig Start Date End Date Taking? Authorizing Provider  ALPRAZolam Prudy Feeler) 0.5 MG tablet Take 0.5 mg by mouth daily as needed for anxiety.  03/24/14   [provider]  buPROPion (WELLBUTRIN XL) 300 MG 24 hr tablet Take 300 mg by mouth daily. 03/01/16   [provider]  calcium carbonate (TUMS) 500 MG  chewable tablet Chew 2 tablets (400 mg of elemental calcium total) by mouth 2 (two) times daily. 09/20/16   Doris Level, MD  EPINEPHrine 0.3 mg/0.3 mL IJ SOAJ injection Inject 0.3 mg into the muscle once.    [provider]  HYDROcodone-acetaminophen (NORCO/VICODIN) 5-325 MG tablet Take 2 tablets by mouth every 4 (four) hours as needed. Patient not taking: Reported on 09/05/2016 06/23/16   Elson Areas, PA-C  HYDROcodone-acetaminophen (NORCO/VICODIN) 5-325 MG tablet Take 1-2 tablets by mouth every 4 (four) hours as needed for moderate pain. 09/20/16   Doris Level, MD  ibuprofen (ADVIL,MOTRIN) 200 MG tablet Take 400 mg by mouth every 6 (six) hours as needed for mild pain.    [provider]  levothyroxine (SYNTHROID) 88 MCG tablet Take 1 tablet (88 mcg total) by mouth daily before breakfast. 09/20/16   Doris Level, MD  losartan (COZAAR) 100 MG tablet Take 1 tablet by mouth daily. 03/24/14   [provider]  Probiotic Product (ALIGN PO) Take 1 tablet by mouth daily. OTC med to help w/ IBS     [provider]  tobramycin (TOBREX) 0.3 % ophthalmic solution Place 2 drops into the right eye every 4 (four) hours. Patient not taking: Reported on 09/05/2016 06/23/16   Osie Cheeks    Family History Family History  Problem Relation Age of Onset  . Coronary artery disease Mother   . Hypertension Mother   . Stroke Mother   . Diabetes Paternal Grandmother   . Diabetes Paternal Uncle   . Anesthesia problems Neg Hx   . Malignant hyperthermia Neg Hx   . Hypotension Neg Hx   . Pseudochol deficiency Neg Hx     Social History Social History  Substance Use Topics  . Smoking status: Current Every Day Smoker    Packs/day: 0.25    Years: 20.00    Types: Cigarettes    Last attempt to quit: 12/25/2013  . Smokeless tobacco: Never Used  . Alcohol use Yes     Comment: occ     Allergies   Celexa [citalopram hydrobromide]; Coconut oil; and  Penicillins   Review of Systems Review of Systems  All other systems reviewed and are negative.    Physical Exam Updated Vital Signs BP (!) 164/108 (BP Location: Left Arm)   Pulse (!) 115   Temp 98.2 F (36.8 C) (Oral)   Resp (!) 29   Ht 5\' 3"  (1.6 m)   Wt 81.6 kg (180 lb)   LMP 07/10/2011   SpO2 98%   BMI 31.89 kg/m   Physical Exam  Constitutional: She appears well-developed and well-nourished. She appears distressed.  HENT:  Head: Normocephalic and atraumatic.  Mouth/Throat: Oropharynx is clear and moist. No oropharyngeal exudate.  Eyes:  Pupils are equal, round, and reactive to light. Conjunctivae and EOM are normal. Right eye exhibits no discharge. Left eye exhibits no discharge. No scleral icterus.  Neck: Normal range of motion. Neck supple. No JVD present. No thyromegaly present.  Cardiovascular: Regular rhythm, normal heart sounds and intact distal pulses.  Exam reveals no gallop and no friction rub.   No murmur heard. Tachycardic to 115, normal pulses at radial arteries  Pulmonary/Chest: Effort normal and breath sounds normal. No respiratory distress. She has no wheezes. She has no rales.  Taking very small breaths - splinting, holding her L side  Abdominal: Soft. Bowel sounds are normal. She exhibits no distension and no mass. There is no tenderness.  Very soft and NT abdomen  Musculoskeletal: Normal range of motion. She exhibits tenderness. She exhibits no edema ( no lower extremity edema).  The patient has significant tenderness to palpation over the area that hurts on the left lateral chest wall as well as the anterior chest wall in the left side of the neck over the trapezius and the strap muscles of the neck. There is no rash in any of these areas  Lymphadenopathy:    She has no cervical adenopathy.  Neurological: She is alert. Coordination normal.  Skin: Skin is warm. No rash noted. She is diaphoretic. No erythema.  diaphoretic  Psychiatric: She has a normal  mood and affect. Her behavior is normal.  Nursing note and vitals reviewed.    ED Treatments / Results  Labs (all labs ordered are listed, but only abnormal results are displayed) Labs Reviewed  CBC WITH DIFFERENTIAL/PLATELET  COMPREHENSIVE METABOLIC PANEL  D-DIMER, QUANTITATIVE (NOT AT [facility])    EKG  EKG Interpretation  Date/Time:  Tuesday April 15 2017 20:28:33 EDT Ventricular Rate:  116 PR Interval:    QRS Duration: 104 QT Interval:  332 QTC Calculation: 462 R Axis:   35 Text Interpretation:  Sinus tachycardia Borderline T wave abnormalities Since last tracing rate faster Confirmed by Eber Hong ([id number]) on 04/15/2017 8:36:37 PM       Radiology No results found.  Procedures Procedures (including critical care time)  Medications Ordered in ED Medications  diazepam (VALIUM) tablet 5 mg (not administered)  HYDROmorphone (DILAUDID) injection 1 mg (1 mg Intravenous Given 04/15/17 2044)  ketorolac (TORADOL) 30 MG/ML injection 30 mg (30 mg Intravenous Given 04/15/17 2044)     Initial Impression / Assessment and Plan / ED Course  I have reviewed the triage vital signs and the nursing notes.  Pertinent labs & imaging results that were available during my care of the patient were reviewed by me and considered in my medical decision making (see chart for details).    The patient appears very symptomatic and even colicky with this severe left-sided pleuritic pain. Due to the magnitude of her symptoms with tachycardia she will need to have an evaluation for pulmonary embolism, pneumothorax, other pulmonary pathology, would also consider kidney stone though less likely given the location of the pain, could be common pleurisy, no signs of zoster, no signs of acute coronary syndrome on the EKG and this does not appear to be exertional. Labs, chest x-ray, pain medications have been ordered  Testing normal - no acute findins Stable for d/c Feels much better after meds Expressed  understanding.  Final Clinical Impressions(s) / ED Diagnoses   Final diagnoses:  Pleurisy    New Prescriptions New Prescriptions   No medications on file     Eber Hong, MD 04/15/17 2303

## 2017-04-21 MED FILL — Hydrocodone-Acetaminophen Tab 5-325 MG: ORAL | Qty: 6 | Status: AC

## 2017-12-17 ENCOUNTER — Other Ambulatory Visit (HOSPITAL_COMMUNITY): Payer: Self-pay | Admitting: Internal Medicine

## 2017-12-17 DIAGNOSIS — Z1231 Encounter for screening mammogram for malignant neoplasm of breast: Secondary | ICD-10-CM

## 2017-12-24 ENCOUNTER — Ambulatory Visit (HOSPITAL_COMMUNITY): Payer: Managed Care, Other (non HMO)

## 2017-12-25 ENCOUNTER — Ambulatory Visit (HOSPITAL_COMMUNITY)
Admission: RE | Admit: 2017-12-25 | Discharge: 2017-12-25 | Disposition: A | Discharge status: Home / Self Care | Payer: Managed Care, Other (non HMO) | Source: Ambulatory Visit | Attending: Internal Medicine | Admitting: Internal Medicine

## 2017-12-25 DIAGNOSIS — Z1231 Encounter for screening mammogram for malignant neoplasm of breast: Secondary | ICD-10-CM

## 2018-01-14 ENCOUNTER — Other Ambulatory Visit: Payer: Self-pay

## 2018-01-14 DIAGNOSIS — M79604 Pain in right leg: Secondary | ICD-10-CM

## 2018-01-14 DIAGNOSIS — M79605 Pain in left leg: Principal | ICD-10-CM

## 2018-02-27 ENCOUNTER — Ambulatory Visit (INDEPENDENT_AMBULATORY_CARE_PROVIDER_SITE_OTHER): Payer: Managed Care, Other (non HMO) | Admitting: Vascular Surgery

## 2018-02-27 ENCOUNTER — Encounter: Payer: Self-pay | Admitting: Vascular Surgery

## 2018-02-27 ENCOUNTER — Ambulatory Visit (HOSPITAL_COMMUNITY)
Admission: RE | Admit: 2018-02-27 | Discharge: 2018-02-27 | Disposition: A | Discharge status: Home / Self Care | Payer: Managed Care, Other (non HMO) | Source: Ambulatory Visit | Attending: Family | Admitting: Family

## 2018-02-27 VITALS — BP 138/102 | HR 92 | Resp 16 | Ht 68.0 in | Wt 201.0 lb

## 2018-02-27 DIAGNOSIS — M79604 Pain in right leg: Secondary | ICD-10-CM | POA: Diagnosis not present

## 2018-02-27 DIAGNOSIS — M79605 Pain in left leg: Secondary | ICD-10-CM

## 2018-02-27 DIAGNOSIS — I872 Venous insufficiency (chronic) (peripheral): Secondary | ICD-10-CM | POA: Diagnosis not present

## 2018-02-27 NOTE — Progress Notes (Signed)
Patient ID: Doris Davis, female   DOB: 04-08-1971, 47 y.o.   MRN: [id number]  Reason for Consult: Leg Pain (ouches at night.)   Referred by Benita Stabile, MD  Subjective:     HPI:  Doris Davis is a 47 y.o. female with history of bilateral lower extremity pain.  She has a history of bilateral ankle injuries that were severe required casting for multiple years as well as multiple surgeries.  She has a chronic pain that prevents her from walking but gets worse in the middle the night prevents her from sleeping.  His pain is mostly focused from her knees down to her calves.  She does not have any tissue loss or ulceration.  She is a non-smoker.  She has never had a blood clot does not take any blood thinners.  She states that she is taking Horizant this is helping some with both her sleep and her pain she just recently started taking it.  She does not have any varicose veins.  She has minimal leg swelling but can sometimes feel it towards the end of the day.  She is here today with reflux studies.  Past Medical History:  Diagnosis Date  . Allergy   . Anemia   . Anxiety   . Arthritis   . Asthma    with contact with cats  . BV (bacterial vaginosis) 08/09/2013  . Family history of adverse reaction to anesthesia    mother was sick for days  . Hypertension   . PONV (postoperative nausea and vomiting)    sts with ablation she was given meds for nausea and "got sicker".  . Vaginal bleeding 08/09/2013   Had supra cervical hyst 2012, has had bleeding after sex  . Venereal disease 1990   chlamydia  . Wears glasses    Family History  Problem Relation Age of Onset  . Coronary artery disease Mother   . Hypertension Mother   . Stroke Mother   . Diabetes Paternal Grandmother   . Diabetes Paternal Uncle   . Anesthesia problems Neg Hx   . Malignant hyperthermia Neg Hx   . Hypotension Neg Hx   . Pseudochol deficiency Neg Hx    Past Surgical History:  Procedure Laterality Date  .  ABDOMINAL HYSTERECTOMY    . BREAST SURGERY     lump removed from left breast back in the 1980's  . BUNIONECTOMY  11/14   rt  . CESAREAN SECTION  1995  . ENDOMETRIAL ABLATION  2010   [facility]  . FOOT ARTHROPLASTY  11/15   rt-revision bunionectomy-  . FRACTURE SURGERY  2005   left ankle-[place]  . HAMMER TOE SURGERY Right 12/01/2014   Procedure: REVISION OF RIGHT 3RD HAMMER TOE CORRECTION;  Surgeon: Toni Arthurs, MD;  Location: [facility];  Service: Orthopedics;  Laterality: Right;  . JOINT REPLACEMENT     big toe joint removed  . LAPAROSCOPIC SUPRACERVICAL HYSTERECTOMY  07/17/2011   Procedure: LAPAROSCOPIC SUPRACERVICAL HYSTERECTOMY;  Surgeon: Lazaro Arms, MD;  Location: [facility];  Service: Gynecology;  Laterality: N/A;  . REMOVAL OF IMPLANT Right 12/01/2014   Procedure: REMOVAL OF  DEEP IMPLANT FROM RIGHT 3RD TOE;  Surgeon: Toni Arthurs, MD;  Location: [facility];  Service: Orthopedics;  Laterality: Right;  . THYROIDECTOMY N/A 09/19/2016   Procedure: TOTAL THYROIDECTOMY;  Surgeon: Darnell Level, MD;  Location: [facility];  Service: General;  Laterality: N/A;    Short Social History:  Social  History   Tobacco Use  . Smoking status: Current Every Day Smoker    Packs/day: 0.25    Years: 20.00    Pack years: 5.00    Types: Cigarettes    Last attempt to quit: 12/25/2013    Years since quitting: 4.1  . Smokeless tobacco: Never Used  Substance Use Topics  . Alcohol use: Yes    Comment: occ    Allergies  Allergen Reactions  . Bee Venom Anaphylaxis  . Celexa [Citalopram Hydrobromide] Anaphylaxis  . Coconut Oil Anaphylaxis  . Penicillins Anaphylaxis    Has patient had a PCN reaction causing immediate rash, facial/tongue/throat swelling, SOB or lightheadedness with hypotension: Yes Has patient had a PCN reaction causing severe rash involving mucus membranes or skin necrosis: No Has patient had a PCN reaction that required hospitalization No Has patient had a PCN  reaction occurring within the last 10 years: No If all of the above answers are "NO", then may proceed with Cephalosporin use.     Current Outpatient Medications  Medication Sig Dispense Refill  . ALPRAZolam (XANAX) 0.5 MG tablet Take 0.5 mg by mouth daily as needed for anxiety.     Marland Kitchen. buPROPion (WELLBUTRIN XL) 300 MG 24 hr tablet Take 300 mg by mouth daily.  0  . calcium carbonate (TUMS) 500 MG chewable tablet Chew 2 tablets (400 mg of elemental calcium total) by mouth 2 (two) times daily. 90 tablet 1  . EPINEPHrine 0.3 mg/0.3 mL IJ SOAJ injection Inject 0.3 mg into the muscle once.    Marland Kitchen. ibuprofen (ADVIL,MOTRIN) 600 MG tablet Take 1 tablet (600 mg total) by mouth every 6 (six) hours as needed. 30 tablet 0  . levothyroxine (SYNTHROID, LEVOTHROID) 88 MCG tablet     . liothyronine (CYTOMEL) 5 MCG tablet     . losartan-hydrochlorothiazide (HYZAAR) 100-12.5 MG tablet Take 1 tablet by mouth daily.      No current facility-administered medications for this visit.     Review of Systems  Constitutional:  Constitutional negative. HENT: HENT negative.  Eyes: Eyes negative.  Cardiovascular: Positive for leg swelling.  Musculoskeletal: Positive for gait problem, leg pain and joint pain.  Skin: Skin negative.  Neurological: Positive for numbness.  Hematologic: Hematologic/lymphatic negative.  Psychiatric: Psychiatric negative.        Objective:  Objective   Vitals:   02/27/18 0939  BP: (!) 138/102  Pulse: 92  Resp: 16  SpO2: 92%  Weight: 201 lb (91.2 kg)  Height: 5\' 8"  (1.727 m)   Body mass index is 30.56 kg/m.  Physical Exam  Constitutional: She is oriented to person, place, and time. She appears well-developed.  Eyes: Pupils are equal, round, and reactive to light.  Neck: Normal range of motion. Neck supple.  Cardiovascular:  Pulses:      Carotid pulses are 2+ on the right side, and 2+ on the left side.      Radial pulses are 2+ on the right side, and 2+ on the left side.        Dorsalis pedis pulses are 2+ on the right side, and 2+ on the left side.       Posterior tibial pulses are 2+ on the right side, and 2+ on the left side.  Abdominal: Soft. She exhibits no mass.  Neurological: She is alert and oriented to person, place, and time.  Skin: Skin is warm and dry.  Psychiatric: She has a normal mood and affect. Her behavior is normal. Judgment and thought content  normal.    Data: I have independently interpreted her venous reflux studies which demonstrates venous reflux on the right throughout her greater saphenous vein and saphenofemoral junction 0.87 cm.  On the left she has reflux in the saphenofemoral junction only size 0.63 cm     Assessment/Plan:     47 year old female presents for evaluation bilateral lower extremity pain.  She has minimal leg swelling does have venous reflux in her bilateral greater saphenous veins right greater than left.  Given her physical exam as well as cluster of symptoms I do not necessarily think this is related to her venous reflux.  I told her we could pursue wearing compression stockings and follow her up to see how this works and consider vein ablation but I am unsure that we will change her symptoms overall and will only add to her procedures.  She does not want to pursue this and I am in agreement.  She can follow-up on a as needed basis.     Maeola Harman MD [facility]

## 2018-03-02 ENCOUNTER — Encounter: Payer: Self-pay | Admitting: Internal Medicine

## 2018-05-01 ENCOUNTER — Emergency Department (HOSPITAL_COMMUNITY): Payer: Managed Care, Other (non HMO)

## 2018-05-01 ENCOUNTER — Encounter (HOSPITAL_COMMUNITY): Payer: Self-pay

## 2018-05-01 ENCOUNTER — Emergency Department (HOSPITAL_COMMUNITY)
Admission: EM | Admit: 2018-05-01 | Discharge: 2018-05-01 | Disposition: A | Discharge status: Home / Self Care | Payer: Managed Care, Other (non HMO) | Attending: Emergency Medicine | Admitting: Emergency Medicine

## 2018-05-01 DIAGNOSIS — I10 Essential (primary) hypertension: Secondary | ICD-10-CM | POA: Insufficient documentation

## 2018-05-01 DIAGNOSIS — F17210 Nicotine dependence, cigarettes, uncomplicated: Secondary | ICD-10-CM | POA: Insufficient documentation

## 2018-05-01 DIAGNOSIS — J45909 Unspecified asthma, uncomplicated: Secondary | ICD-10-CM | POA: Insufficient documentation

## 2018-05-01 DIAGNOSIS — Z79899 Other long term (current) drug therapy: Secondary | ICD-10-CM | POA: Insufficient documentation

## 2018-05-01 DIAGNOSIS — R0789 Other chest pain: Secondary | ICD-10-CM | POA: Diagnosis present

## 2018-05-01 DIAGNOSIS — R091 Pleurisy: Secondary | ICD-10-CM | POA: Insufficient documentation

## 2018-05-01 LAB — BASIC METABOLIC PANEL
ANION GAP: 7 (ref 5–15)
BUN: 16 mg/dL (ref 6–20)
CHLORIDE: 105 mmol/L (ref 98–111)
CO2: 26 mmol/L (ref 22–32)
CREATININE: 0.78 mg/dL (ref 0.44–1.00)
Calcium: 9.3 mg/dL (ref 8.9–10.3)
GFR calc non Af Amer: >60 mL/min (ref >60)
Glucose, Bld: 86 mg/dL (ref 70–99)
Potassium: 3.7 mmol/L (ref 3.5–5.1)
Sodium: 138 mmol/L (ref 135–145)

## 2018-05-01 LAB — TROPONIN I: Troponin I: <0.03 ng/mL (ref <0.03)

## 2018-05-01 LAB — URINALYSIS, ROUTINE W REFLEX MICROSCOPIC
Bilirubin Urine: NEGATIVE
Glucose, UA: NEGATIVE mg/dL
Hgb urine dipstick: NEGATIVE
Ketones, ur: NEGATIVE mg/dL
LEUKOCYTES UA: NEGATIVE
NITRITE: NEGATIVE
PROTEIN: NEGATIVE mg/dL
Specific Gravity, Urine: 1.006 (ref 1.005–1.030)
pH: 6 (ref 5.0–8.0)

## 2018-05-01 LAB — D-DIMER, QUANTITATIVE (NOT AT ARMC)

## 2018-05-01 LAB — CBC
HEMATOCRIT: 36.3 % (ref 36.0–46.0)
HEMOGLOBIN: 11.3 g/dL — AB (ref 12.0–15.0)
MCH: 27.2 pg (ref 26.0–34.0)
MCHC: 31.1 g/dL (ref 30.0–36.0)
MCV: 87.3 fL (ref 78.0–100.0)
Platelets: 267 10*3/uL (ref 150–400)
RBC: 4.16 MIL/uL (ref 3.87–5.11)
RDW: 15 % (ref 11.5–15.5)
WBC: 10.2 10*3/uL (ref 4.0–10.5)

## 2018-05-01 MED ORDER — IBUPROFEN 600 MG PO TABS: 600.0000 mg | ORAL_TABLET | Freq: Four times a day (QID) | ORAL | 0 refills | Status: DC | PRN

## 2018-05-01 MED ORDER — KETOROLAC TROMETHAMINE 30 MG/ML IJ SOLN
30.0000 mg | Freq: Once | INTRAMUSCULAR | Status: AC
  Administered 2018-05-01: 30 mg via INTRAVENOUS
  Filled 2018-05-01: qty 1

## 2018-05-01 NOTE — ED Provider Notes (Signed)
[facility] EMERGENCY DEPARTMENT Provider Note   CSN: [id number] Arrival date & time: 05/01/18  0710     History   Chief Complaint Chief Complaint  Patient presents with  . Chest Pain    HPI Doris Davis is a 47 y.o. female.  HPI  The patient is a very pleasant 47 year old female, she has no significant cardiac or pulmonary history, she does have a history of having pleurisy in the past, she still smokes 3 to 4 cigarettes a day but denies any other risk factors for pulmonary embolism including recent travel trauma immobilization surgery hormone use or swelling of her legs.  She has had no fevers or coughing and only started having chest pain this morning approximately 4 hours ago when she woke up from sleep with a sharp stabbing pain around the left side of her inferior rib margin on the left.  This is worse with coughing deep breathing but does not seem to be positional.  She does note that she feels better when she stands up, seems to have more frequent pains when she is sitting down.  This is similar to the pain she experienced with pleurisy in the past.  She is not known to have pulmonary embolism or cardiac problems in the past.  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  Past Medical History:  Diagnosis Date  . Allergy   . Anemia   . Anxiety   . Arthritis   . Asthma    with contact with cats  . BV (bacterial vaginosis) 08/09/2013  . Family history of adverse reaction to anesthesia    mother was sick for days  . Hypertension   . PONV (postoperative nausea and vomiting)    sts with ablation she was given meds for nausea and "got sicker".  . Vaginal bleeding 08/09/2013   Had supra cervical hyst 2012, has had bleeding after sex  . Venereal disease 1990   chlamydia  . Wears glasses     Patient Active Problem List   Diagnosis Date Noted  . Multinodular goiter (nontoxic) 09/19/2016  . Multiple thyroid nodules 09/11/2016  . Obese 03/18/2016  .  Smoker 03/18/2016  . Thyroid nodule 03/18/2016  . Perimenopausal 03/18/2016  . Hypertension 08/09/2013  . Vaginal bleeding 08/09/2013  . BV (bacterial vaginosis) 08/09/2013  . Stress fracture of foot with nonunion 06/02/2013  . Infection of right foot 04/01/2013  . Right foot pain 04/01/2013    Past Surgical History:  Procedure Laterality Date  . ABDOMINAL HYSTERECTOMY    . BREAST SURGERY     lump removed from left breast back in the 1980's  . BUNIONECTOMY  11/14   rt  . CESAREAN SECTION  1995  . ENDOMETRIAL ABLATION  2010   [facility]  . FOOT ARTHROPLASTY  11/15   rt-revision bunionectomy-  . FRACTURE SURGERY  2005   left ankle-[place]  . HAMMER TOE SURGERY Right 12/01/2014   Procedure: REVISION OF RIGHT 3RD HAMMER TOE CORRECTION;  Surgeon: Toni Arthurs, MD;  Location: [facility];  Service: Orthopedics;  Laterality: Right;  . JOINT REPLACEMENT     big toe joint removed  . LAPAROSCOPIC SUPRACERVICAL HYSTERECTOMY  07/17/2011   Procedure: LAPAROSCOPIC SUPRACERVICAL HYSTERECTOMY;  Surgeon: Lazaro Arms, MD;  Location: [facility];  Service: Gynecology;  Laterality: N/A;  . REMOVAL OF IMPLANT Right 12/01/2014   Procedure: REMOVAL OF  DEEP IMPLANT FROM RIGHT 3RD TOE;  Surgeon: Toni Arthurs, MD;  Location: [facility]  [place];  Service: Orthopedics;  Laterality: Right;  . THYROIDECTOMY N/A 09/19/2016   Procedure: TOTAL THYROIDECTOMY;  Surgeon: Darnell Level, MD;  Location: [facility] OR;  Service: General;  Laterality: N/A;     OB History    Gravida  5   Para  2   Term  1   Preterm  1   AB  3   Living  1     SAB  3   TAB      Ectopic      Multiple      Live Births  2            Home Medications    Prior to Admission medications   Medication Sig Start Date End Date Taking? Authorizing Provider  ALPRAZolam Prudy Feeler) 0.5 MG tablet Take 0.5 mg by mouth daily as needed for anxiety.  03/24/14   [provider]  buPROPion (WELLBUTRIN XL) 300 MG 24 hr  tablet Take 300 mg by mouth daily. 03/01/16   [provider]  calcium carbonate (TUMS) 500 MG chewable tablet Chew 2 tablets (400 mg of elemental calcium total) by mouth 2 (two) times daily. 09/20/16   Darnell Level, MD  EPINEPHrine 0.3 mg/0.3 mL IJ SOAJ injection Inject 0.3 mg into the muscle once.    [provider]  ibuprofen (ADVIL,MOTRIN) 600 MG tablet Take 1 tablet (600 mg total) by mouth every 6 (six) hours as needed. 05/01/18   Eber Hong, MD  levothyroxine Erline Levine, LEVOTHROID) 88 MCG tablet  02/11/18   [provider]  liothyronine (CYTOMEL) 5 MCG tablet  02/11/18   [provider]  losartan-hydrochlorothiazide (HYZAAR) 100-12.5 MG tablet Take 1 tablet by mouth daily.  03/21/17   [provider]    Family History Family History  Problem Relation Age of Onset  . Coronary artery disease Mother   . Hypertension Mother   . Stroke Mother   . Diabetes Paternal Grandmother   . Diabetes Paternal Uncle   . Anesthesia problems Neg Hx   . Malignant hyperthermia Neg Hx   . Hypotension Neg Hx   . Pseudochol deficiency Neg Hx     Social History Social History   Tobacco Use  . Smoking status: Current Every Day Smoker    Packs/day: 0.25    Years: 20.00    Pack years: 5.00    Types: Cigarettes    Last attempt to quit: 12/25/2013    Years since quitting: 4.3  . Smokeless tobacco: Never Used  Substance Use Topics  . Alcohol use: Yes    Comment: occ  . Drug use: No     Allergies   Bee venom; Celexa [citalopram hydrobromide]; Coconut oil; and Penicillins   Review of Systems Review of Systems  All other systems reviewed and are negative.    Physical Exam Updated Vital Signs BP (!) 154/113 (BP Location: Left Arm)   Pulse (!) 101   Temp 98.1 F (36.7 C) (Oral)   Resp 20   Wt 91.2 kg   LMP 07/10/2011   SpO2 100%   BMI 30.57 kg/m   Physical Exam  Constitutional: She appears well-developed and well-nourished. No distress.    HENT:  Head: Normocephalic and atraumatic.  Mouth/Throat: Oropharynx is clear and moist. No oropharyngeal exudate.  Eyes: Pupils are equal, round, and reactive to light. Conjunctivae and EOM are normal. Right eye exhibits no discharge. Left eye exhibits no discharge. No scleral icterus.  Neck: Normal range of motion. Neck supple. No  JVD present. No thyromegaly present.  Cardiovascular: Normal rate, regular rhythm, normal heart sounds and intact distal pulses. Exam reveals no gallop and no friction rub.  No murmur heard. Rate is 95 on my exam, normal pulses, no peripheral edema or JVD  Pulmonary/Chest: Effort normal and breath sounds normal. No respiratory distress. She has no wheezes. She has no rales. She exhibits tenderness ( There is reproducible test tenderness around the left flank posterior laterally, the skin is exposed in this area with no rash).  The breath sounds are normal and she is able to take a deep breath though this does cause some pain  Abdominal: Soft. Bowel sounds are normal. She exhibits no distension and no mass. There is no tenderness.  Musculoskeletal: Normal range of motion. She exhibits no edema or tenderness.  No edema of the lower extremities  Lymphadenopathy:    She has no cervical adenopathy.  Neurological: She is alert. Coordination normal.  Patient has a normal gait and normal speech  Skin: Skin is warm and dry. No rash noted. No erythema.  No rashes around the flank  Psychiatric: She has a normal mood and affect. Her behavior is normal.  Nursing note and vitals reviewed.    ED Treatments / Results  Labs (all labs ordered are listed, but only abnormal results are displayed) Labs Reviewed  CBC - Abnormal; Notable for the following components:      Result Value   Hemoglobin 11.3 (*)    All other components within normal limits  BASIC METABOLIC PANEL  TROPONIN I  D-DIMER, QUANTITATIVE (NOT AT [facility])  URINALYSIS, ROUTINE W REFLEX MICROSCOPIC     EKG EKG Interpretation  Date/Time:  Friday May 01 2018 07:38:31 EDT Ventricular Rate:  94 PR Interval:    QRS Duration: 107 QT Interval:  364 QTC Calculation: 456 R Axis:   46 Text Interpretation:  Sinus rhythm Poor R wave progression since last tracing no significant change no ST elevation.  Confirmed by Eber Hong ([id number]) on 05/01/2018 8:13:00 AM   Radiology Dg Chest 2 View  Result Date: 05/01/2018 CLINICAL DATA:  Left-sided pain EXAM: CHEST - 2 VIEW COMPARISON:  April 15, 2017 FINDINGS: There is no edema or consolidation. Heart size and pulmonary vascularity are normal. No adenopathy. No pneumothorax. No bone lesions. There are surgical clips in the thyroid region. IMPRESSION: No edema or consolidation. Electronically Signed   By: Bretta Bang III M.D.   On: 05/01/2018 09:17    Procedures Procedures (including critical care time)  Medications Ordered in ED Medications  ketorolac (TORADOL) 30 MG/ML injection 30 mg (30 mg Intravenous Given 05/01/18 0742)     Initial Impression / Assessment and Plan / ED Course  I have reviewed the triage vital signs and the nursing notes.  Pertinent labs & imaging results that were available during my care of the patient were reviewed by me and considered in my medical decision making (see chart for details).  Clinical Course as of May 02 935  Fri May 01, 2018  6045 Labs are unremarkable including the d-dimer troponin metabolic panel and CBC, the chest x-ray according to my interpretation shows no signs of acute infiltrate pneumothorax or pneumomediastinum.  There is no cardiomegaly or pulmonary edema.  Radiology report is in agreement.  EKG is unremarkable, d-dimer is normal, unlikely to be pathologic given this plethora of good results.  The patient has been informed of these results and at this point is stable for discharge on an anti-inflammatory.   [  BM]    Clinical Course User Index [BM] Eber HongMiller, Zahi Plaskett, MD   Potential  causes of this pain would be pleurisy, pulmonary embolism, pneumothorax as the patient is a smoker, it is less likely to be renal given that she has respiratory symptoms and pain that is almost constant and does not seem to be so much colicky.  We will start with d-dimer, other lab work, chest x-ray and EKG.  Urinalysis to make sure there is no hematuria associated  Final Clinical Impressions(s) / ED Diagnoses   Final diagnoses:  Pleurisy    ED Discharge Orders         Ordered    ibuprofen (ADVIL,MOTRIN) 600 MG tablet  Every 6 hours PRN     05/01/18 0935           Eber HongMiller, Natina Wiginton, MD 05/01/18 [phone]

## 2018-05-01 NOTE — Discharge Instructions (Signed)
Your testing today showed a normal x-ray, normal blood work and EKG Your symptoms are likely coming from pleurisy, please read the attached instructions This is not a life-threatening condition however it can be persistently painful for sometimes days or even weeks Take ibuprofen up to 600 mg 3 times a day as needed, take plenty of slow deep breaths Emergency department for increasing shortness of breath fevers or swelling of the legs.  Please have your family doctor recheck you within 48 hours.

## 2018-05-01 NOTE — ED Triage Notes (Signed)
Pt reports waking up at 4 am with pain under left lateral rib area. Pain has worsened and is now are sharp. Pt reports pain with deep breathing and coughing. Hx  Of pleursiy one year ago

## 2019-02-11 ENCOUNTER — Other Ambulatory Visit (HOSPITAL_COMMUNITY): Payer: Self-pay | Admitting: Internal Medicine

## 2019-02-11 DIAGNOSIS — Z1231 Encounter for screening mammogram for malignant neoplasm of breast: Secondary | ICD-10-CM

## 2019-05-09 ENCOUNTER — Emergency Department (HOSPITAL_COMMUNITY): Payer: Managed Care, Other (non HMO)

## 2019-05-09 ENCOUNTER — Emergency Department (HOSPITAL_COMMUNITY)
Admission: EM | Admit: 2019-05-09 | Discharge: 2019-05-09 | Disposition: A | Discharge status: Home / Self Care | Payer: Managed Care, Other (non HMO) | Attending: Emergency Medicine | Admitting: Emergency Medicine

## 2019-05-09 ENCOUNTER — Encounter (HOSPITAL_COMMUNITY): Payer: Self-pay

## 2019-05-09 ENCOUNTER — Other Ambulatory Visit: Payer: Self-pay

## 2019-05-09 DIAGNOSIS — R0602 Shortness of breath: Secondary | ICD-10-CM

## 2019-05-09 DIAGNOSIS — J45909 Unspecified asthma, uncomplicated: Secondary | ICD-10-CM | POA: Diagnosis not present

## 2019-05-09 DIAGNOSIS — R0789 Other chest pain: Secondary | ICD-10-CM | POA: Diagnosis not present

## 2019-05-09 DIAGNOSIS — Z20828 Contact with and (suspected) exposure to other viral communicable diseases: Secondary | ICD-10-CM | POA: Insufficient documentation

## 2019-05-09 DIAGNOSIS — I10 Essential (primary) hypertension: Secondary | ICD-10-CM | POA: Insufficient documentation

## 2019-05-09 DIAGNOSIS — J3489 Other specified disorders of nose and nasal sinuses: Secondary | ICD-10-CM | POA: Insufficient documentation

## 2019-05-09 DIAGNOSIS — Z79899 Other long term (current) drug therapy: Secondary | ICD-10-CM | POA: Diagnosis not present

## 2019-05-09 DIAGNOSIS — Z20822 Contact with and (suspected) exposure to covid-19: Secondary | ICD-10-CM

## 2019-05-09 DIAGNOSIS — R05 Cough: Secondary | ICD-10-CM | POA: Diagnosis not present

## 2019-05-09 DIAGNOSIS — R062 Wheezing: Secondary | ICD-10-CM

## 2019-05-09 DIAGNOSIS — F17210 Nicotine dependence, cigarettes, uncomplicated: Secondary | ICD-10-CM | POA: Insufficient documentation

## 2019-05-09 DIAGNOSIS — R059 Cough, unspecified: Secondary | ICD-10-CM

## 2019-05-09 LAB — COMPREHENSIVE METABOLIC PANEL
ALT: 23 U/L (ref 0–44)
AST: 21 U/L (ref 15–41)
Albumin: 4.6 g/dL (ref 3.5–5.0)
Alkaline Phosphatase: 95 U/L (ref 38–126)
Anion gap: 9 (ref 5–15)
BUN: 10 mg/dL (ref 6–20)
CO2: 27 mmol/L (ref 22–32)
Calcium: 9.8 mg/dL (ref 8.9–10.3)
Chloride: 103 mmol/L (ref 98–111)
Creatinine, Ser: 0.68 mg/dL (ref 0.44–1.00)
GFR calc Af Amer: >60 mL/min (ref >60)
GFR calc non Af Amer: >60 mL/min (ref >60)
Glucose, Bld: 84 mg/dL (ref 70–99)
Potassium: 3.7 mmol/L (ref 3.5–5.1)
Sodium: 139 mmol/L (ref 135–145)
Total Bilirubin: 0.6 mg/dL (ref 0.3–1.2)
Total Protein: 7.5 g/dL (ref 6.5–8.1)

## 2019-05-09 LAB — CBC WITH DIFFERENTIAL/PLATELET
Abs Immature Granulocytes: 0.02 10*3/uL (ref 0.00–0.07)
Basophils Absolute: 0 10*3/uL (ref 0.0–0.1)
Basophils Relative: 0 %
Eosinophils Absolute: 0.3 10*3/uL (ref 0.0–0.5)
Eosinophils Relative: 5 %
HCT: 36.3 % (ref 36.0–46.0)
Hemoglobin: 11.4 g/dL — ABNORMAL LOW (ref 12.0–15.0)
Immature Granulocytes: 0 %
Lymphocytes Relative: 28 %
Lymphs Abs: 2 10*3/uL (ref 0.7–4.0)
MCH: 26.9 pg (ref 26.0–34.0)
MCHC: 31.4 g/dL (ref 30.0–36.0)
MCV: 85.6 fL (ref 80.0–100.0)
Monocytes Absolute: 0.8 10*3/uL (ref 0.1–1.0)
Monocytes Relative: 11 %
Neutro Abs: 4 10*3/uL (ref 1.7–7.7)
Neutrophils Relative %: 56 %
Platelets: 267 10*3/uL (ref 150–400)
RBC: 4.24 MIL/uL (ref 3.87–5.11)
RDW: 15.1 % (ref 11.5–15.5)
WBC: 7.2 10*3/uL (ref 4.0–10.5)
nRBC: 0 % (ref 0.0–0.2)

## 2019-05-09 MED ORDER — PREDNISONE 20 MG PO TABS: 60.0000 mg | ORAL_TABLET | Freq: Once | ORAL | Status: DC

## 2019-05-09 MED ORDER — BENZONATATE 100 MG PO CAPS: 100.0000 mg | ORAL_CAPSULE | Freq: Three times a day (TID) | ORAL | 0 refills | Status: DC | PRN

## 2019-05-09 MED ORDER — METHYLPREDNISOLONE SODIUM SUCC 125 MG IJ SOLR
125.0000 mg | Freq: Once | INTRAMUSCULAR | Status: AC
  Administered 2019-05-09: 11:00:00 125 mg via INTRAVENOUS
  Filled 2019-05-09: qty 2

## 2019-05-09 MED ORDER — MAGNESIUM SULFATE 2 GM/50ML IV SOLN: 2.0000 g | Freq: Once | INTRAVENOUS | Status: DC

## 2019-05-09 MED ORDER — ALBUTEROL SULFATE HFA 108 (90 BASE) MCG/ACT IN AERS
4.0000 | INHALATION_SPRAY | Freq: Once | RESPIRATORY_TRACT | Status: AC
  Administered 2019-05-09: 11:00:00 4 via RESPIRATORY_TRACT
  Filled 2019-05-09: qty 6.7

## 2019-05-09 NOTE — ED Notes (Signed)
Pt verbalized understanding of D/C instructions. Belongings are returned and pt is ambulatory out of ED

## 2019-05-09 NOTE — ED Triage Notes (Addendum)
Patient c/o cough, shob, and fever.   Patient states she started having wheezing Friday and thought it was allergies to detergent. Yesterday patient started coughing, fever, sore throat, and some shob w/ chest pressure.   Patient feels like she has a sinus infection.    Temp at home-101  Temp in ED-98.1   Hx. Allergies   A/ox4 Ambulatory in triage.

## 2019-05-09 NOTE — ED Provider Notes (Signed)
[facility] DEPT Provider Note   CSN: [id number] Arrival date & time: 05/09/19  5732     History   Chief Complaint Chief Complaint  Patient presents with  . Cough    HPI Doris Davis is a 47 y.o. female with history of allergies, asthma, hypertension presents for evaluation of acute onset, progressively worsening cough and congestion for 3 days.  She reports symptoms began on Friday after using a new detergent which she then discontinued with a little bit of improvement temporarily.  She reports that over the last 2 days or so she has developed fever maximum 101 F, myalgias, cough productive of white sputum, rhinorrhea.  He has had one episode of nonbloody nonbilious posttussive emesis.  Notes chest pressure and tightness and has worsening pain in her chest and abdomen with her persistent cough.  She has taken Mucinex and Tylenol with little relief.  He is a current smoker of approximately 5 cigarettes daily.     The history is provided by the patient.    Past Medical History:  Diagnosis Date  . Allergy   . Anemia   . Anxiety   . Arthritis   . Asthma    with contact with cats  . BV (bacterial vaginosis) 08/09/2013  . Family history of adverse reaction to anesthesia    mother was sick for days  . Hypertension   . PONV (postoperative nausea and vomiting)    sts with ablation she was given meds for nausea and "got sicker".  . Vaginal bleeding 08/09/2013   Had supra cervical hyst 2012, has had bleeding after sex  . Venereal disease 1990   chlamydia  . Wears glasses     Patient Active Problem List   Diagnosis Date Noted  . Multinodular goiter (nontoxic) 09/19/2016  . Multiple thyroid nodules 09/11/2016  . Obese 03/18/2016  . Smoker 03/18/2016  . Thyroid nodule 03/18/2016  . Perimenopausal 03/18/2016  . Hypertension 08/09/2013  . Vaginal bleeding 08/09/2013  . BV (bacterial vaginosis) 08/09/2013  . Stress fracture of foot with nonunion  06/02/2013  . Infection of right foot 04/01/2013  . Right foot pain 04/01/2013    Past Surgical History:  Procedure Laterality Date  . ABDOMINAL HYSTERECTOMY    . BREAST SURGERY     lump removed from left breast back in the 1980's  . BUNIONECTOMY  11/14   rt  . CESAREAN SECTION  1995  . ENDOMETRIAL ABLATION  2010   [facility]  . FOOT ARTHROPLASTY  11/15   rt-revision bunionectomy-  . FRACTURE SURGERY  2005   left ankle-[place]  . HAMMER TOE SURGERY Right 12/01/2014   Procedure: REVISION OF RIGHT 3RD HAMMER TOE CORRECTION;  Surgeon: Wylene Simmer, MD;  Location: [facility];  Service: Orthopedics;  Laterality: Right;  . JOINT REPLACEMENT     big toe joint removed  . LAPAROSCOPIC SUPRACERVICAL HYSTERECTOMY  07/17/2011   Procedure: LAPAROSCOPIC SUPRACERVICAL HYSTERECTOMY;  Surgeon: Florian Buff, MD;  Location: [facility];  Service: Gynecology;  Laterality: N/A;  . REMOVAL OF IMPLANT Right 12/01/2014   Procedure: REMOVAL OF  DEEP IMPLANT FROM RIGHT 3RD TOE;  Surgeon: Wylene Simmer, MD;  Location: [facility];  Service: Orthopedics;  Laterality: Right;  . THYROIDECTOMY N/A 09/19/2016   Procedure: TOTAL THYROIDECTOMY;  Surgeon: Armandina Gemma, MD;  Location: [facility];  Service: General;  Laterality: N/A;     OB History    Gravida  5   Para  2  Term  1   Preterm  1   AB  3   Living  1     SAB  3   TAB      Ectopic      Multiple      Live Births  2            Home Medications    Prior to Admission medications   Medication Sig Start Date End Date Taking? Authorizing Provider  ALPRAZolam Prudy Feeler) 0.5 MG tablet Take 0.5 mg by mouth daily as needed for anxiety.  03/24/14  Yes [provider]  buPROPion (WELLBUTRIN XL) 300 MG 24 hr tablet Take 300 mg by mouth daily. 03/01/16  Yes [provider]  EPINEPHrine 0.3 mg/0.3 mL IJ SOAJ injection Inject 0.3 mg into the muscle once.   Yes [provider]  ibuprofen (ADVIL,MOTRIN) 600 MG  tablet Take 1 tablet (600 mg total) by mouth every 6 (six) hours as needed. 05/01/18  Yes Eber Hong, MD  levothyroxine (SYNTHROID, LEVOTHROID) 88 MCG tablet Take 88 mcg by mouth daily before breakfast.  02/11/18  Yes [provider]  liothyronine (CYTOMEL) 5 MCG tablet Take 5 mcg by mouth daily.  02/11/18  Yes [provider]  losartan-hydrochlorothiazide (HYZAAR) 100-12.5 MG tablet Take 1 tablet by mouth daily.  03/21/17  Yes [provider]  benzonatate (TESSALON) 100 MG capsule Take 1 capsule (100 mg total) by mouth 3 (three) times daily as needed for cough. 05/09/19   Jeanie Sewer, PA-C    Family History Family History  Problem Relation Age of Onset  . Coronary artery disease Mother   . Hypertension Mother   . Stroke Mother   . Diabetes Paternal Grandmother   . Diabetes Paternal Uncle   . Anesthesia problems Neg Hx   . Malignant hyperthermia Neg Hx   . Hypotension Neg Hx   . Pseudochol deficiency Neg Hx     Social History Social History   Tobacco Use  . Smoking status: Current Every Day Smoker    Packs/day: 0.25    Years: 20.00    Pack years: 5.00    Types: Cigarettes    Last attempt to quit: 12/25/2013    Years since quitting: 5.3  . Smokeless tobacco: Never Used  Substance Use Topics  . Alcohol use: Yes    Comment: occ  . Drug use: No     Allergies   Bee venom, Celexa [citalopram hydrobromide], Coconut oil, and Penicillins   Review of Systems Review of Systems  Constitutional: Positive for chills and fever.  HENT: Positive for rhinorrhea. Negative for sore throat.   Respiratory: Positive for cough, chest tightness, shortness of breath and wheezing.   Cardiovascular: Positive for chest pain.  Gastrointestinal: Positive for vomiting (Posttussive).  Musculoskeletal: Positive for myalgias.  All other systems reviewed and are negative.    Physical Exam Updated Vital Signs BP 115/87   Pulse 88   Temp 98.1 F (36.7 C) (Oral)   Resp  18   LMP 07/10/2011   SpO2 100%   Physical Exam Vitals signs and nursing note reviewed.  Constitutional:      General: She is not in acute distress.    Appearance: She is well-developed.     Comments: Resting in bed, persistent cough  HENT:     Head: Normocephalic and atraumatic.     Nose: Rhinorrhea present.     Mouth/Throat:     Mouth: Mucous membranes are moist.  Eyes:  General:        Right eye: No discharge.        Left eye: No discharge.     Conjunctiva/sclera: Conjunctivae normal.  Neck:     Musculoskeletal: Normal range of motion and neck supple. No neck rigidity.     Vascular: No JVD.     Trachea: No tracheal deviation.  Cardiovascular:     Rate and Rhythm: Normal rate and regular rhythm.  Pulmonary:     Comments: Scattered expiratory wheezes, persistent cough noted.  PO2 saturation is 100% on room air.  Tenderness to palpation along the parasternal region bilaterally with no deformity, crepitus, ecchymosis, or flail segment. Chest:     Chest wall: Tenderness present.    Abdominal:     General: Bowel sounds are normal. There is no distension.     Palpations: Abdomen is soft.     Tenderness: There is no abdominal tenderness. There is no guarding.  Skin:    General: Skin is warm and dry.     Findings: No erythema.  Neurological:     Mental Status: She is alert.  Psychiatric:        Behavior: Behavior normal.      ED Treatments / Results  Labs (all labs ordered are listed, but only abnormal results are displayed) Labs Reviewed  CBC WITH DIFFERENTIAL/PLATELET - Abnormal; Notable for the following components:      Result Value   Hemoglobin 11.4 (*)    All other components within normal limits  NOVEL CORONAVIRUS, NAA (HOSP ORDER, SEND-OUT TO REF LAB; TAT 18-24 HRS)  COMPREHENSIVE METABOLIC PANEL    EKG EKG Interpretation  Date/Time:  Sunday May 09 2019 09:58:45 EDT Ventricular Rate:  83 PR Interval:    QRS Duration: 101 QT Interval:  381 QTC  Calculation: 448 R Axis:   12 Text Interpretation:  Sinus rhythm No significant change since last tracing Confirmed by Lorre NickAllen, Anthony ([id number]) on 05/09/2019 12:26:15 PM   Radiology Dg Chest Port 1 View  Result Date: 05/09/2019 CLINICAL DATA:  48 year old female with cough, wheezing, fever, sore throat EXAM: PORTABLE CHEST 1 VIEW COMPARISON:  Prior chest x-ray 05/01/2018 FINDINGS: The lungs are clear and negative for focal airspace consolidation, pulmonary edema or suspicious pulmonary nodule. No pleural effusion or pneumothorax. Cardiac and mediastinal contours are within normal limits. No acute fracture or lytic or blastic osseous lesions. The visualized upper abdominal bowel gas pattern is unremarkable. IMPRESSION: No active disease. Electronically Signed   By: Malachy MoanHeath  McCullough M.D.   On: 05/09/2019 10:39    Procedures Procedures (including critical care time)  Medications Ordered in ED Medications  albuterol (VENTOLIN HFA) 108 (90 Base) MCG/ACT inhaler 4 puff (4 puffs Inhalation Given 05/09/19 1040)  methylPREDNISolone sodium succinate (SOLU-MEDROL) 125 mg/2 mL injection 125 mg (125 mg Intravenous Given 05/09/19 1040)     Initial Impression / Assessment and Plan / ED Course  I have reviewed the triage vital signs and the nursing notes.  Pertinent labs & imaging results that were available during my care of the patient were reviewed by me and considered in my medical decision making (see chart for details).        Doris Davis was evaluated in Emergency Department on 05/09/2019 for the symptoms described in the history of present illness. She was evaluated in the context of the global COVID-19 pandemic, which necessitated consideration that the patient might be at risk for infection with the SARS-CoV-2 virus that causes COVID-19. Institutional protocols and  algorithms that pertain to the evaluation of patients at risk for COVID-19 are in a state of rapid change based on information  released by regulatory bodies including the CDC and federal and state organizations. These policies and algorithms were followed during the patient's care in the ED.  Patient presenting for evaluation of viral illness with cough.  She is afebrile, initially hypertensive with improvement on subsequent reevaluations.  She is nontoxic in appearance.  Diffuse wheezing on auscultation of the lungs which improved with albuterol and Solu-Medrol in the ED.  Chest x-ray shows no acute cardiopulmonary abnormalities.  Blood work reviewed by me shows no leukocytosis, mild anemia, no metabolic derangements.  Her oxygen saturations are stable while in the ED and she does not appear to be in any respiratory distress.  She feels a lot better after the steroids and albuterol.  EKG shows normal sinus rhythm.  Doubt ACS/MI or PE.  Stable for discharge home with follow-up with PCP for reevaluation of symptoms.  Discussed symptomatic management.  Discussed quarantining at home per current CDC guidelines as well as strict ED return precautions. Patient verbalized understanding of and agreement with plan and is safe for discharge home at this time.   Final Clinical Impressions(s) / ED Diagnoses   Final diagnoses:  Suspected Covid-19 Virus Infection    ED Discharge Orders         Ordered    benzonatate (TESSALON) 100 MG capsule  3 times daily PRN     05/09/19 1216           Jeanie SewerFawze, Yoshi Vicencio A, PA-C 05/09/19 1228    Lorre NickAllen, Anthony, MD 05/10/19 1447

## 2019-05-09 NOTE — Discharge Instructions (Addendum)
Start taking prednisone as prescribed beginning tomorrow.  You received the first dose in the emergency department today.  You can use the albuterol inhaler 1 to 2 puffs every 4-6 hours as needed for shortness of breath.  Take Tessalon as needed for cough.  You can continue to take over-the-counter medications such as Mucinex, Zyrtec, Delsym.  Take Tylenol 1 to 2 tablets every 6 hours as needed for fever pain.  While you are taking the prednisone do not take ibuprofen, Advil, Aleve, or Motrin but when you are done with the prednisone you can take ibuprofen as needed for fever pain.  Drink plenty of fluids and get plenty of rest.  Your COVID test will result in the next 48 hours or so.  You will receive a phone call if your test is positive, no phone call if your test is negative but you can double check your results on my chart.  If your test is negative, call your employer for further recommendations regarding their COVID quarantine policy.  Please isolate yourself at home for the next 2 weeks. Wash your hands frequently.  Avoid any contact with old people, young people, or people with medical problems.  Do not leave the house during this time.   Follow-up with your primary care provider for reevaluation of your symptoms.   Please return to the emergency department immediately if any concerning signs or symptoms develop such as worsening shortness of breath, high fevers, severe chest pains, persistent cough.

## 2019-05-10 LAB — NOVEL CORONAVIRUS, NAA (HOSP ORDER, SEND-OUT TO REF LAB; TAT 18-24 HRS): SARS-CoV-2, NAA: NOT DETECTED

## 2019-05-10 NOTE — ED Notes (Signed)
Pt called verbalizing did not have a prescription for prednisone as discussed at time of discharge. Nanavati MD verbal to call in prescription for 40 mg predisone daily for 5 days, no refills to pharmacy listed on discharge paperwork.   1122 05/10/19

## 2019-11-12 DIAGNOSIS — E039 Hypothyroidism, unspecified: Secondary | ICD-10-CM | POA: Diagnosis not present

## 2019-11-12 DIAGNOSIS — F331 Major depressive disorder, recurrent, moderate: Secondary | ICD-10-CM | POA: Diagnosis not present

## 2019-11-12 DIAGNOSIS — E782 Mixed hyperlipidemia: Secondary | ICD-10-CM | POA: Diagnosis not present

## 2019-11-12 DIAGNOSIS — I10 Essential (primary) hypertension: Secondary | ICD-10-CM | POA: Diagnosis not present

## 2019-12-02 DIAGNOSIS — Z23 Encounter for immunization: Secondary | ICD-10-CM | POA: Diagnosis not present

## 2019-12-23 DIAGNOSIS — Z23 Encounter for immunization: Secondary | ICD-10-CM | POA: Diagnosis not present

## 2020-03-08 DIAGNOSIS — E039 Hypothyroidism, unspecified: Secondary | ICD-10-CM | POA: Diagnosis not present

## 2020-03-08 DIAGNOSIS — F331 Major depressive disorder, recurrent, moderate: Secondary | ICD-10-CM | POA: Diagnosis not present

## 2020-03-08 DIAGNOSIS — E669 Obesity, unspecified: Secondary | ICD-10-CM | POA: Diagnosis not present

## 2020-03-08 DIAGNOSIS — E782 Mixed hyperlipidemia: Secondary | ICD-10-CM | POA: Diagnosis not present

## 2020-03-08 DIAGNOSIS — F411 Generalized anxiety disorder: Secondary | ICD-10-CM | POA: Diagnosis not present

## 2020-03-08 DIAGNOSIS — B353 Tinea pedis: Secondary | ICD-10-CM | POA: Diagnosis not present

## 2020-04-05 DIAGNOSIS — Z20822 Contact with and (suspected) exposure to covid-19: Secondary | ICD-10-CM | POA: Diagnosis not present

## 2020-04-08 DIAGNOSIS — Z20828 Contact with and (suspected) exposure to other viral communicable diseases: Secondary | ICD-10-CM | POA: Diagnosis not present

## 2020-04-08 DIAGNOSIS — R05 Cough: Secondary | ICD-10-CM | POA: Diagnosis not present

## 2020-04-17 ENCOUNTER — Other Ambulatory Visit: Payer: Self-pay | Admitting: Internal Medicine

## 2020-04-17 DIAGNOSIS — Z1231 Encounter for screening mammogram for malignant neoplasm of breast: Secondary | ICD-10-CM

## 2020-04-18 DIAGNOSIS — R079 Chest pain, unspecified: Secondary | ICD-10-CM | POA: Diagnosis not present

## 2020-04-18 DIAGNOSIS — Z6833 Body mass index (BMI) 33.0-33.9, adult: Secondary | ICD-10-CM | POA: Diagnosis not present

## 2020-04-18 DIAGNOSIS — E039 Hypothyroidism, unspecified: Secondary | ICD-10-CM | POA: Diagnosis not present

## 2020-04-18 DIAGNOSIS — R091 Pleurisy: Secondary | ICD-10-CM | POA: Diagnosis not present

## 2020-04-18 DIAGNOSIS — F331 Major depressive disorder, recurrent, moderate: Secondary | ICD-10-CM | POA: Diagnosis not present

## 2020-04-18 DIAGNOSIS — M25562 Pain in left knee: Secondary | ICD-10-CM | POA: Diagnosis not present

## 2020-04-22 ENCOUNTER — Other Ambulatory Visit: Payer: Self-pay

## 2020-04-22 ENCOUNTER — Ambulatory Visit
Admission: EM | Admit: 2020-04-22 | Discharge: 2020-04-22 | Disposition: A | Discharge status: Home / Self Care | Payer: BC Managed Care – PPO

## 2020-04-22 ENCOUNTER — Encounter: Payer: Self-pay | Admitting: *Deleted

## 2020-04-22 ENCOUNTER — Ambulatory Visit (INDEPENDENT_AMBULATORY_CARE_PROVIDER_SITE_OTHER): Payer: BC Managed Care – PPO

## 2020-04-22 ENCOUNTER — Ambulatory Visit: Payer: BC Managed Care – PPO

## 2020-04-22 DIAGNOSIS — R079 Chest pain, unspecified: Secondary | ICD-10-CM | POA: Diagnosis not present

## 2020-04-22 DIAGNOSIS — M546 Pain in thoracic spine: Secondary | ICD-10-CM | POA: Diagnosis not present

## 2020-04-22 DIAGNOSIS — R0789 Other chest pain: Secondary | ICD-10-CM

## 2020-04-22 MED ORDER — VALACYCLOVIR HCL 1 G PO TABS: 1000.0000 mg | ORAL_TABLET | Freq: Three times a day (TID) | ORAL | 0 refills | Status: DC

## 2020-04-22 NOTE — Discharge Instructions (Addendum)
Take Valtrex 3 times daily for the whole week. Important follow-up with your PCP for further evaluation if needed. Go to ER for worsening chest pain, difficulty breathing, fever.

## 2020-04-22 NOTE — ED Triage Notes (Signed)
Pt reports having had Covid sxs 3 wks ago, but tested negative.  States woke 1 wk ago with sharp, intense pain from under left breast area radiating around to left back; pain much worse with breathing, movement, palpation; states bra irritates pain.  Saw PCP 5 days ago - was given steroid and muscle relaxers without significant relief.  Denies cough.  Pt with guarded movements.

## 2020-04-22 NOTE — ED Provider Notes (Signed)
[facility]    CSN: [id number] Arrival date & time: 04/22/20  0802      History   Chief Complaint Chief Complaint  Patient presents with  . Chest Pain    HPI Doris Davis is a 49 y.o. female with history of hypertension presenting for left-sided pain.  States it goes around under her left breast to her back, does not cross midline.  Has noted mild rash without pruritus.  Denies fever, thrashes, myalgias.  States she has had this in the past: Treated for pleurisy.  Requesting chest x-ray per her PCP as she had Covid symptoms about 3 weeks ago.  Patient denying cough, shortness of breath, palpitations, lightheadedness, dizziness, nausea.  No known history of shingles, though endorses history of chickenpox.    Past Medical History:  Diagnosis Date  . Allergy   . Anemia   . Anxiety   . Arthritis   . Asthma    with contact with cats  . BV (bacterial vaginosis) 08/09/2013  . Family history of adverse reaction to anesthesia    mother was sick for days  . Hypertension   . PONV (postoperative nausea and vomiting)    sts with ablation she was given meds for nausea and "got sicker".  . Vaginal bleeding 08/09/2013   Had supra cervical hyst 2012, has had bleeding after sex  . Venereal disease 1990   chlamydia  . Wears glasses     Patient Active Problem List   Diagnosis Date Noted  . Multinodular goiter (nontoxic) 09/19/2016  . Multiple thyroid nodules 09/11/2016  . Obese 03/18/2016  . Smoker 03/18/2016  . Thyroid nodule 03/18/2016  . Perimenopausal 03/18/2016  . Hypertension 08/09/2013  . Vaginal bleeding 08/09/2013  . BV (bacterial vaginosis) 08/09/2013  . Stress fracture of foot with nonunion 06/02/2013  . Infection of right foot 04/01/2013  . Right foot pain 04/01/2013    Past Surgical History:  Procedure Laterality Date  . ABDOMINAL HYSTERECTOMY    . BREAST SURGERY     lump removed from left breast back in the 1980's  . BUNIONECTOMY  11/14   rt  .  CESAREAN SECTION  1995  . ENDOMETRIAL ABLATION  2010   [facility]  . FOOT ARTHROPLASTY  11/15   rt-revision bunionectomy-  . FOOT SURGERY    . FRACTURE SURGERY  2005   left ankle-[place]  . HAMMER TOE SURGERY Right 12/01/2014   Procedure: REVISION OF RIGHT 3RD HAMMER TOE CORRECTION;  Surgeon: Toni Arthurs, MD;  Location: [facility];  Service: Orthopedics;  Laterality: Right;  . JOINT REPLACEMENT     big toe joint removed  . LAPAROSCOPIC SUPRACERVICAL HYSTERECTOMY  07/17/2011   Procedure: LAPAROSCOPIC SUPRACERVICAL HYSTERECTOMY;  Surgeon: Lazaro Arms, MD;  Location: [facility];  Service: Gynecology;  Laterality: N/A;  . REMOVAL OF IMPLANT Right 12/01/2014   Procedure: REMOVAL OF  DEEP IMPLANT FROM RIGHT 3RD TOE;  Surgeon: Toni Arthurs, MD;  Location: [facility];  Service: Orthopedics;  Laterality: Right;  . THYROIDECTOMY N/A 09/19/2016   Procedure: TOTAL THYROIDECTOMY;  Surgeon: Darnell Level, MD;  Location[facility];  Service: General;  Laterality: N/A;    OB History    Gravida  5   Para  2   Term  1   Preterm  1   AB  3   Living  1     SAB  3   TAB      Ectopic  Multiple      Live Births  2            Home Medications    Prior to Admission medications   Medication Sig Start Date End Date Taking? Authorizing Provider  ALPRAZolam Prudy Feeler) 0.5 MG tablet Take 0.5 mg by mouth daily as needed for anxiety.  03/24/14  Yes [provider]  buPROPion (WELLBUTRIN XL) 300 MG 24 hr tablet Take 300 mg by mouth daily. 03/01/16  Yes [provider]  ibuprofen (ADVIL,MOTRIN) 600 MG tablet Take 1 tablet (600 mg total) by mouth every 6 (six) hours as needed. 05/01/18  Yes Eber Hong, MD  levothyroxine (SYNTHROID, LEVOTHROID) 88 MCG tablet Take 88 mcg by mouth daily before breakfast.  02/11/18  Yes [provider]  liothyronine (CYTOMEL) 5 MCG tablet Take 5 mcg by mouth daily.  02/11/18  Yes [provider]    losartan-hydrochlorothiazide (HYZAAR) 100-12.5 MG tablet Take 1 tablet by mouth daily.  03/21/17  Yes [provider]  Multiple Vitamin (MULTIVITAMIN) capsule Take 1 capsule by mouth daily.   Yes [provider]  PREDNISONE PO Take by mouth.   Yes [provider]  TIZANIDINE HCL PO Take by mouth.   Yes [provider]  VITAMIN D PO Take by mouth.   Yes [provider]  EPINEPHrine 0.3 mg/0.3 mL IJ SOAJ injection Inject 0.3 mg into the muscle once.    [provider]  valACYclovir (VALTREX) 1000 MG tablet Take 1 tablet (1,000 mg total) by mouth 3 (three) times daily. 04/22/20   Hall-Potvin, [place], PA-C    Family History Family History  Problem Relation Age of Onset  . Coronary artery disease Mother   . Hypertension Mother   . Stroke Mother   . Diabetes Paternal Grandmother   . Diabetes Paternal Uncle   . Anesthesia problems Neg Hx   . Malignant hyperthermia Neg Hx   . Hypotension Neg Hx   . Pseudochol deficiency Neg Hx     Social History Social History   Tobacco Use  . Smoking status: Current Every Day Smoker    Packs/day: 0.25    Years: 20.00    Pack years: 5.00    Types: Cigarettes    Last attempt to quit: 12/25/2013    Years since quitting: 6.3  . Smokeless tobacco: Never Used  Vaping Use  . Vaping Use: Never used  Substance Use Topics  . Alcohol use: Yes    Comment: occ  . Drug use: No     Allergies   Bee venom, Celexa [citalopram hydrobromide], Coconut oil, and Penicillins   Review of Systems As per HPI   Physical Exam Triage Vital Signs ED Triage Vitals [04/22/20 0814]  Enc Vitals Group     BP (!) 149/103     Pulse Rate 87     Resp 16     Temp 98.1 F (36.7 C)     Temp Source Oral     SpO2 98 %     Weight      Height      Head Circumference      Peak Flow      Pain Score 8     Pain Loc      Pain Edu?      Excl. in GC?    No data found.  Updated Vital Signs BP (!) 149/103   Pulse 87    Temp 98.1 F (36.7 C) (Oral)   Resp 16  LMP 07/10/2011   SpO2 98%   Visual Acuity Right Eye Distance:   Left Eye Distance:   Bilateral Distance:    Right Eye Near:   Left Eye Near:    Bilateral Near:     Physical Exam Constitutional:      General: She is not in acute distress. HENT:     Head: Normocephalic and atraumatic.  Eyes:     General: No scleral icterus.    Pupils: Pupils are equal, round, and reactive to light.  Cardiovascular:     Rate and Rhythm: Normal rate.  Pulmonary:     Effort: Pulmonary effort is normal.  Skin:    Coloration: Skin is not jaundiced or pale.       Neurological:     Mental Status: She is alert and oriented to person, place, and time.      UC Treatments / Results  Labs (all labs ordered are listed, but only abnormal results are displayed) Labs Reviewed - No data to display  EKG   Radiology DG Chest 2 View  Result Date: 04/22/2020 CLINICAL DATA:  Chest pain EXAM: CHEST - 2 VIEW COMPARISON:  05/09/2019 FINDINGS: Cardiac shadow is within normal limits. The lungs are well aerated bilaterally. No focal infiltrate or sizable effusion is seen. No bony abnormality is noted. IMPRESSION: No acute abnormality seen. Electronically Signed   By: Alcide Clever M.D.   On: 04/22/2020 08:53    Procedures Procedures (including critical care time)  Medications Ordered in UC Medications - No data to display  Initial Impression / Assessment and Plan / UC Course  I have reviewed the triage vital signs and the nursing notes.  Pertinent labs & imaging results that were available during my care of the patient were reviewed by me and considered in my medical decision making (see chart for details).     CXR done at patient's request: Negative.  Will treat for shingles given dermatomal distribution, exquisite pain (? Neuritis).  Return precautions discussed, pt verbalized understanding and is agreeable to plan. Final Clinical Impressions(s) / UC  Diagnoses   Final diagnoses:  Left-sided chest wall pain  Acute left-sided thoracic back pain     Discharge Instructions     Take Valtrex 3 times daily for the whole week. Important follow-up with your PCP for further evaluation if needed. Go to ER for worsening chest pain, difficulty breathing, fever.    ED Prescriptions    Medication Sig Dispense Auth. Provider   valACYclovir (VALTREX) 1000 MG tablet Take 1 tablet (1,000 mg total) by mouth 3 (three) times daily. 21 tablet Hall-Potvin, [place], PA-C     PDMP not reviewed this encounter.   Odette Fraction [place], [place] 04/22/20 [phone]

## 2020-05-02 ENCOUNTER — Ambulatory Visit: Payer: Managed Care, Other (non HMO)

## 2020-06-02 ENCOUNTER — Ambulatory Visit
Admission: RE | Admit: 2020-06-02 | Discharge: 2020-06-02 | Disposition: A | Discharge status: Home / Self Care | Payer: BC Managed Care – PPO | Source: Ambulatory Visit | Attending: Internal Medicine | Admitting: Internal Medicine

## 2020-06-02 ENCOUNTER — Other Ambulatory Visit: Payer: Self-pay

## 2020-06-02 DIAGNOSIS — Z1231 Encounter for screening mammogram for malignant neoplasm of breast: Secondary | ICD-10-CM | POA: Diagnosis not present

## 2020-06-13 DIAGNOSIS — Z23 Encounter for immunization: Secondary | ICD-10-CM | POA: Diagnosis not present

## 2020-07-21 DIAGNOSIS — M79674 Pain in right toe(s): Secondary | ICD-10-CM | POA: Diagnosis not present

## 2020-09-22 DIAGNOSIS — Z1152 Encounter for screening for COVID-19: Secondary | ICD-10-CM | POA: Diagnosis not present

## 2020-10-06 DIAGNOSIS — F331 Major depressive disorder, recurrent, moderate: Secondary | ICD-10-CM | POA: Diagnosis not present

## 2020-10-06 DIAGNOSIS — I10 Essential (primary) hypertension: Secondary | ICD-10-CM | POA: Diagnosis not present

## 2020-10-06 DIAGNOSIS — E039 Hypothyroidism, unspecified: Secondary | ICD-10-CM | POA: Diagnosis not present

## 2020-10-06 DIAGNOSIS — F411 Generalized anxiety disorder: Secondary | ICD-10-CM | POA: Diagnosis not present

## 2020-10-24 DIAGNOSIS — F331 Major depressive disorder, recurrent, moderate: Secondary | ICD-10-CM | POA: Diagnosis not present

## 2020-10-24 DIAGNOSIS — Z6833 Body mass index (BMI) 33.0-33.9, adult: Secondary | ICD-10-CM | POA: Diagnosis not present

## 2020-10-24 DIAGNOSIS — E039 Hypothyroidism, unspecified: Secondary | ICD-10-CM | POA: Diagnosis not present

## 2020-10-24 DIAGNOSIS — R202 Paresthesia of skin: Secondary | ICD-10-CM | POA: Diagnosis not present

## 2020-10-24 DIAGNOSIS — E669 Obesity, unspecified: Secondary | ICD-10-CM | POA: Diagnosis not present

## 2020-10-24 DIAGNOSIS — M25562 Pain in left knee: Secondary | ICD-10-CM | POA: Diagnosis not present

## 2020-11-03 DIAGNOSIS — I10 Essential (primary) hypertension: Secondary | ICD-10-CM | POA: Diagnosis not present

## 2020-11-03 DIAGNOSIS — E782 Mixed hyperlipidemia: Secondary | ICD-10-CM | POA: Diagnosis not present

## 2020-11-03 DIAGNOSIS — E039 Hypothyroidism, unspecified: Secondary | ICD-10-CM | POA: Diagnosis not present

## 2020-11-03 DIAGNOSIS — F331 Major depressive disorder, recurrent, moderate: Secondary | ICD-10-CM | POA: Diagnosis not present

## 2020-11-30 DIAGNOSIS — J019 Acute sinusitis, unspecified: Secondary | ICD-10-CM | POA: Diagnosis not present

## 2020-12-25 ENCOUNTER — Other Ambulatory Visit: Payer: Self-pay

## 2020-12-25 ENCOUNTER — Emergency Department (HOSPITAL_COMMUNITY): Payer: BC Managed Care – PPO

## 2020-12-25 ENCOUNTER — Ambulatory Visit
Admission: EM | Admit: 2020-12-25 | Discharge: 2020-12-25 | Disposition: A | Discharge status: Home / Self Care | Payer: BC Managed Care – PPO | Attending: Family Medicine | Admitting: Family Medicine

## 2020-12-25 ENCOUNTER — Observation Stay (HOSPITAL_COMMUNITY)
Admission: EM | Admit: 2020-12-25 | Discharge: 2020-12-27 | Disposition: A | Discharge status: Home / Self Care | Payer: BC Managed Care – PPO | Attending: General Surgery | Admitting: General Surgery

## 2020-12-25 ENCOUNTER — Encounter (HOSPITAL_COMMUNITY): Payer: Self-pay

## 2020-12-25 DIAGNOSIS — K8010 Calculus of gallbladder with chronic cholecystitis without obstruction: Secondary | ICD-10-CM | POA: Diagnosis not present

## 2020-12-25 DIAGNOSIS — K810 Acute cholecystitis: Secondary | ICD-10-CM | POA: Diagnosis not present

## 2020-12-25 DIAGNOSIS — K8012 Calculus of gallbladder with acute and chronic cholecystitis without obstruction: Principal | ICD-10-CM | POA: Insufficient documentation

## 2020-12-25 DIAGNOSIS — K828 Other specified diseases of gallbladder: Secondary | ICD-10-CM | POA: Diagnosis not present

## 2020-12-25 DIAGNOSIS — I10 Essential (primary) hypertension: Secondary | ICD-10-CM | POA: Insufficient documentation

## 2020-12-25 DIAGNOSIS — I700 Atherosclerosis of aorta: Secondary | ICD-10-CM | POA: Diagnosis not present

## 2020-12-25 DIAGNOSIS — R110 Nausea: Secondary | ICD-10-CM | POA: Diagnosis not present

## 2020-12-25 DIAGNOSIS — F17210 Nicotine dependence, cigarettes, uncomplicated: Secondary | ICD-10-CM | POA: Insufficient documentation

## 2020-12-25 DIAGNOSIS — R1011 Right upper quadrant pain: Secondary | ICD-10-CM

## 2020-12-25 DIAGNOSIS — R000 Tachycardia, unspecified: Secondary | ICD-10-CM | POA: Diagnosis not present

## 2020-12-25 DIAGNOSIS — Z79899 Other long term (current) drug therapy: Secondary | ICD-10-CM | POA: Diagnosis not present

## 2020-12-25 DIAGNOSIS — K8020 Calculus of gallbladder without cholecystitis without obstruction: Secondary | ICD-10-CM | POA: Diagnosis not present

## 2020-12-25 DIAGNOSIS — J45909 Unspecified asthma, uncomplicated: Secondary | ICD-10-CM | POA: Diagnosis not present

## 2020-12-25 DIAGNOSIS — Z20822 Contact with and (suspected) exposure to covid-19: Secondary | ICD-10-CM | POA: Diagnosis not present

## 2020-12-25 DIAGNOSIS — K760 Fatty (change of) liver, not elsewhere classified: Secondary | ICD-10-CM | POA: Diagnosis not present

## 2020-12-25 DIAGNOSIS — R079 Chest pain, unspecified: Secondary | ICD-10-CM | POA: Diagnosis not present

## 2020-12-25 DIAGNOSIS — R0602 Shortness of breath: Secondary | ICD-10-CM | POA: Diagnosis not present

## 2020-12-25 DIAGNOSIS — M5136 Other intervertebral disc degeneration, lumbar region: Secondary | ICD-10-CM | POA: Diagnosis not present

## 2020-12-25 DIAGNOSIS — E890 Postprocedural hypothyroidism: Secondary | ICD-10-CM | POA: Diagnosis not present

## 2020-12-25 DIAGNOSIS — R0789 Other chest pain: Secondary | ICD-10-CM | POA: Diagnosis not present

## 2020-12-25 DIAGNOSIS — I2510 Atherosclerotic heart disease of native coronary artery without angina pectoris: Secondary | ICD-10-CM | POA: Diagnosis not present

## 2020-12-25 DIAGNOSIS — R457 State of emotional shock and stress, unspecified: Secondary | ICD-10-CM | POA: Diagnosis not present

## 2020-12-25 LAB — RESP PANEL BY RT-PCR (FLU A&B, COVID) ARPGX2
Influenza A by PCR: NEGATIVE
Influenza B by PCR: NEGATIVE
SARS Coronavirus 2 by RT PCR: NEGATIVE

## 2020-12-25 LAB — COMPREHENSIVE METABOLIC PANEL
ALT: 26 U/L (ref 0–44)
AST: 24 U/L (ref 15–41)
Albumin: 4.3 g/dL (ref 3.5–5.0)
Alkaline Phosphatase: 104 U/L (ref 38–126)
Anion gap: 10 (ref 5–15)
BUN: 11 mg/dL (ref 6–20)
CO2: 27 mmol/L (ref 22–32)
Calcium: 10.3 mg/dL (ref 8.9–10.3)
Chloride: 102 mmol/L (ref 98–111)
Creatinine, Ser: 0.71 mg/dL (ref 0.44–1.00)
GFR, Estimated: >60 mL/min (ref >60)
Glucose, Bld: 106 mg/dL — ABNORMAL HIGH (ref 70–99)
Potassium: 3.5 mmol/L (ref 3.5–5.1)
Sodium: 139 mmol/L (ref 135–145)
Total Bilirubin: 0.5 mg/dL (ref 0.3–1.2)
Total Protein: 7.5 g/dL (ref 6.5–8.1)

## 2020-12-25 LAB — CBC WITH DIFFERENTIAL/PLATELET
Abs Immature Granulocytes: 0.02 10*3/uL (ref 0.00–0.07)
Basophils Absolute: 0 10*3/uL (ref 0.0–0.1)
Basophils Relative: 0 %
Eosinophils Absolute: 0.4 10*3/uL (ref 0.0–0.5)
Eosinophils Relative: 5 %
HCT: 35.8 % — ABNORMAL LOW (ref 36.0–46.0)
Hemoglobin: 11.1 g/dL — ABNORMAL LOW (ref 12.0–15.0)
Immature Granulocytes: 0 %
Lymphocytes Relative: 37 %
Lymphs Abs: 2.6 10*3/uL (ref 0.7–4.0)
MCH: 26.1 pg (ref 26.0–34.0)
MCHC: 31 g/dL (ref 30.0–36.0)
MCV: 84.2 fL (ref 80.0–100.0)
Monocytes Absolute: 0.5 10*3/uL (ref 0.1–1.0)
Monocytes Relative: 7 %
Neutro Abs: 3.6 10*3/uL (ref 1.7–7.7)
Neutrophils Relative %: 51 %
Platelets: 266 10*3/uL (ref 150–400)
RBC: 4.25 MIL/uL (ref 3.87–5.11)
RDW: 14.7 % (ref 11.5–15.5)
WBC: 7.2 10*3/uL (ref 4.0–10.5)
nRBC: 0 % (ref 0.0–0.2)

## 2020-12-25 LAB — LIPASE, BLOOD: Lipase: 45 U/L (ref 11–51)

## 2020-12-25 LAB — TROPONIN I (HIGH SENSITIVITY)
Troponin I (High Sensitivity): 4 ng/L (ref <18)
Troponin I (High Sensitivity): 3 ng/L (ref <18)

## 2020-12-25 MED ORDER — ONDANSETRON HCL 4 MG/2ML IJ SOLN
4.0000 mg | Freq: Once | INTRAMUSCULAR | Status: AC
  Administered 2020-12-25: 4 mg via INTRAVENOUS
  Filled 2020-12-25: qty 2

## 2020-12-25 MED ORDER — LIOTHYRONINE SODIUM 5 MCG PO TABS
5.0000 ug | ORAL_TABLET | Freq: Every day | ORAL | Status: DC
  Administered 2020-12-26: 5 ug via ORAL
  Filled 2020-12-25 (×2): qty 1

## 2020-12-25 MED ORDER — TRAMADOL HCL 50 MG PO TABS: 50.0000 mg | ORAL_TABLET | Freq: Four times a day (QID) | ORAL | Status: DC | PRN

## 2020-12-25 MED ORDER — ASPIRIN 81 MG PO CHEW
324.0000 mg | CHEWABLE_TABLET | Freq: Once | ORAL | Status: AC
  Administered 2020-12-25: 324 mg via ORAL

## 2020-12-25 MED ORDER — SODIUM CHLORIDE 0.9 % IV SOLN
12.5000 mg | Freq: Four times a day (QID) | INTRAVENOUS | Status: DC | PRN
  Filled 2020-12-25: qty 0.5

## 2020-12-25 MED ORDER — LOSARTAN POTASSIUM 50 MG PO TABS
100.0000 mg | ORAL_TABLET | Freq: Every day | ORAL | Status: DC
  Administered 2020-12-26 – 2020-12-27 (×2): 100 mg via ORAL
  Filled 2020-12-25 (×2): qty 2

## 2020-12-25 MED ORDER — HEPARIN SODIUM (PORCINE) 5000 UNIT/ML IJ SOLN
5000.0000 [IU] | Freq: Three times a day (TID) | INTRAMUSCULAR | Status: DC
  Administered 2020-12-25 – 2020-12-26 (×2): 5000 [IU] via SUBCUTANEOUS
  Filled 2020-12-25 (×2): qty 1

## 2020-12-25 MED ORDER — ONDANSETRON 4 MG PO TBDP: 4.0000 mg | ORAL_TABLET | Freq: Four times a day (QID) | ORAL | Status: DC | PRN

## 2020-12-25 MED ORDER — HYDRALAZINE HCL 20 MG/ML IJ SOLN: 10.0000 mg | INTRAMUSCULAR | Status: DC | PRN

## 2020-12-25 MED ORDER — ONDANSETRON HCL 4 MG/2ML IJ SOLN
4.0000 mg | Freq: Four times a day (QID) | INTRAMUSCULAR | Status: DC | PRN
  Administered 2020-12-25: 4 mg via INTRAVENOUS
  Filled 2020-12-25: qty 2

## 2020-12-25 MED ORDER — NITROGLYCERIN 0.4 MG SL SUBL
0.4000 mg | SUBLINGUAL_TABLET | SUBLINGUAL | Status: DC | PRN
  Administered 2020-12-25 (×2): 0.4 mg via SUBLINGUAL
  Filled 2020-12-25: qty 1

## 2020-12-25 MED ORDER — MORPHINE SULFATE (PF) 4 MG/ML IV SOLN
4.0000 mg | Freq: Once | INTRAVENOUS | Status: AC
  Administered 2020-12-25: 4 mg via INTRAVENOUS
  Filled 2020-12-25: qty 1

## 2020-12-25 MED ORDER — LACTATED RINGERS IV SOLN: INTRAVENOUS | Status: DC

## 2020-12-25 MED ORDER — SODIUM CHLORIDE 0.9 % IV SOLN
2.0000 g | Freq: Once | INTRAVENOUS | Status: AC
  Administered 2020-12-25: 2 g via INTRAVENOUS
  Filled 2020-12-25: qty 20

## 2020-12-25 MED ORDER — CIPROFLOXACIN IN D5W 400 MG/200ML IV SOLN
400.0000 mg | Freq: Two times a day (BID) | INTRAVENOUS | Status: DC
  Administered 2020-12-26 (×2): 400 mg via INTRAVENOUS
  Filled 2020-12-25 (×4): qty 200

## 2020-12-25 MED ORDER — SIMETHICONE 80 MG PO CHEW: 40.0000 mg | CHEWABLE_TABLET | Freq: Four times a day (QID) | ORAL | Status: DC | PRN

## 2020-12-25 MED ORDER — BUPROPION HCL ER (XL) 150 MG PO TB24: 300.0000 mg | ORAL_TABLET | Freq: Every day | ORAL | Status: DC

## 2020-12-25 MED ORDER — LEVOTHYROXINE SODIUM 88 MCG PO TABS
88.0000 ug | ORAL_TABLET | Freq: Every day | ORAL | Status: DC
  Administered 2020-12-26: 88 ug via ORAL
  Filled 2020-12-25: qty 1

## 2020-12-25 MED ORDER — ALPRAZOLAM 0.5 MG PO TABS: 0.5000 mg | ORAL_TABLET | Freq: Every day | ORAL | Status: DC | PRN

## 2020-12-25 MED ORDER — LOSARTAN POTASSIUM-HCTZ 100-12.5 MG PO TABS: 1.0000 | ORAL_TABLET | Freq: Every day | ORAL | Status: DC

## 2020-12-25 MED ORDER — ACETAMINOPHEN 500 MG PO TABS: 1000.0000 mg | ORAL_TABLET | Freq: Four times a day (QID) | ORAL | Status: DC

## 2020-12-25 MED ORDER — ACETAMINOPHEN 500 MG PO TABS
1000.0000 mg | ORAL_TABLET | Freq: Four times a day (QID) | ORAL | Status: DC
  Administered 2020-12-26: 1000 mg via ORAL
  Filled 2020-12-25: qty 2

## 2020-12-25 MED ORDER — DIPHENHYDRAMINE HCL 50 MG/ML IJ SOLN
12.5000 mg | Freq: Four times a day (QID) | INTRAMUSCULAR | Status: DC | PRN
  Administered 2020-12-26: 12.5 mg via INTRAVENOUS

## 2020-12-25 MED ORDER — IOHEXOL 350 MG/ML SOLN
100.0000 mL | Freq: Once | INTRAVENOUS | Status: AC | PRN
  Administered 2020-12-25: 100 mL via INTRAVENOUS

## 2020-12-25 MED ORDER — HYDROMORPHONE HCL 1 MG/ML IJ SOLN
0.5000 mg | INTRAMUSCULAR | Status: DC | PRN
  Administered 2020-12-25: 0.5 mg via INTRAVENOUS
  Filled 2020-12-25: qty 1

## 2020-12-25 MED ORDER — IBUPROFEN 600 MG PO TABS
600.0000 mg | ORAL_TABLET | Freq: Four times a day (QID) | ORAL | Status: DC | PRN
  Administered 2020-12-26: 600 mg via ORAL
  Filled 2020-12-25: qty 1

## 2020-12-25 MED ORDER — EPINEPHRINE 0.3 MG/0.3ML IJ SOAJ: 0.3000 mg | Freq: Once | INTRAMUSCULAR | Status: DC

## 2020-12-25 MED ORDER — METHOCARBAMOL 500 MG PO TABS: 500.0000 mg | ORAL_TABLET | Freq: Four times a day (QID) | ORAL | Status: DC | PRN

## 2020-12-25 MED ORDER — ASPIRIN 81 MG PO CHEW: 324.0000 mg | CHEWABLE_TABLET | Freq: Once | ORAL | Status: DC

## 2020-12-25 MED ORDER — HYDROCHLOROTHIAZIDE 12.5 MG PO CAPS
12.5000 mg | ORAL_CAPSULE | Freq: Every day | ORAL | Status: DC
  Administered 2020-12-26 – 2020-12-27 (×2): 12.5 mg via ORAL
  Filled 2020-12-25 (×2): qty 1

## 2020-12-25 MED ORDER — VALACYCLOVIR HCL 500 MG PO TABS: 1000.0000 mg | ORAL_TABLET | Freq: Three times a day (TID) | ORAL | Status: DC

## 2020-12-25 MED ORDER — DOCUSATE SODIUM 100 MG PO CAPS: 100.0000 mg | ORAL_CAPSULE | Freq: Two times a day (BID) | ORAL | Status: DC

## 2020-12-25 MED ORDER — LIOTHYRONINE SODIUM 5 MCG PO TABS: 5.0000 ug | ORAL_TABLET | Freq: Every day | ORAL | Status: DC

## 2020-12-25 MED ORDER — DIPHENHYDRAMINE HCL 12.5 MG/5ML PO ELIX: 12.5000 mg | ORAL_SOLUTION | Freq: Four times a day (QID) | ORAL | Status: DC | PRN

## 2020-12-25 NOTE — ED Notes (Signed)
Pt transported to CT ?

## 2020-12-25 NOTE — ED Provider Notes (Signed)
[facility] EMERGENCY DEPARTMENT Provider Note   CSN: [id number] Arrival date & time: 12/25/20  1149     History Chief Complaint  Patient presents with  . Chest Pain  . Shortness of Breath  . Nausea    Sudden onset cp that radiated to right jaw and back while driving with SOB and nausea    GENNAVIEVE HUQ is a 50 y.o. female.  HPI      50 year old female with a history of hypertension, family history of early heart disease, presents with concern for chest pain from the urgent care.  Reports that she woke with chest pain that has been present since 3 AM this morning.  Reports it is localized to the mid chest, radiating across her chest into her bilateral jaw, worse on the right.  She had crushing chest pain at 3 AM, however improved somewhat this morning and today when she went out to run errands she had worsening of her chest pain and shortness of breath.  Reports associated nausea and diaphoresis.  Also felt lightheaded as she moved around.  In addition to feeling the chest tightness with radiation to the jaw, she reports an intermittent feeling like a sort of stabbing her through to the back.  Denied abdominal pain on initial history.  Denied cough, fever.  Has never had chest pain like this before.  Her mother was diagnosed with coronary artery disease below the age of 26.  She does not smoke cigarettes.  Drinks approximately 3-4 shots a night, denies other drug use.   Past Medical History:  Diagnosis Date  . Allergy   . Anemia   . Anxiety   . Arthritis   . Asthma    with contact with cats  . BV (bacterial vaginosis) 08/09/2013  . Family history of adverse reaction to anesthesia    mother was sick for days  . Hypertension   . PONV (postoperative nausea and vomiting)    sts with ablation she was given meds for nausea and "got sicker".  . Vaginal bleeding 08/09/2013   Had supra cervical hyst 2012, has had bleeding after sex  . Venereal disease 1990    chlamydia  . Wears glasses     Patient Active Problem List   Diagnosis Date Noted  . Acute cholecystitis 12/25/2020  . Multinodular goiter (nontoxic) 09/19/2016  . Multiple thyroid nodules 09/11/2016  . Obese 03/18/2016  . Smoker 03/18/2016  . Thyroid nodule 03/18/2016  . Perimenopausal 03/18/2016  . Hypertension 08/09/2013  . Vaginal bleeding 08/09/2013  . BV (bacterial vaginosis) 08/09/2013  . Stress fracture of foot with nonunion 06/02/2013  . Infection of right foot 04/01/2013  . Right foot pain 04/01/2013    Past Surgical History:  Procedure Laterality Date  . ABDOMINAL HYSTERECTOMY    . BREAST CYST EXCISION Left    35 yrs ago- benign  . BREAST SURGERY     lump removed from left breast back in the 1980's  . BUNIONECTOMY  11/14   rt  . CESAREAN SECTION  1995  . ENDOMETRIAL ABLATION  2010   [facility]  . FOOT ARTHROPLASTY  11/15   rt-revision bunionectomy-  . FOOT SURGERY    . FRACTURE SURGERY  2005   left [place]  . HAMMER TOE SURGERY Right 12/01/2014   Procedure: REVISION OF RIGHT 3RD HAMMER TOE CORRECTION;  Surgeon: Toni Arthurs, MD;  Location: [facility];  Service: Orthopedics;  Laterality: Right;  . JOINT REPLACEMENT  big toe joint removed  . LAPAROSCOPIC SUPRACERVICAL HYSTERECTOMY  07/17/2011   Procedure: LAPAROSCOPIC SUPRACERVICAL HYSTERECTOMY;  Surgeon: Lazaro Arms, MD;  Location: [facility];  Service: Gynecology;  Laterality: N/A;  . REMOVAL OF IMPLANT Right 12/01/2014   Procedure: REMOVAL OF  DEEP IMPLANT FROM RIGHT 3RD TOE;  Surgeon: Toni Arthurs, MD;  Location: [facility];  Service: Orthopedics;  Laterality: Right;  . THYROIDECTOMY N/A 09/19/2016   Procedure: TOTAL THYROIDECTOMY;  Surgeon: Darnell Level, MD;  Location: [facility];  Service: General;  Laterality: N/A;     OB History    Gravida  5   Para  2   Term  1   Preterm  1   AB  3   Living  1     SAB  3   IAB      Ectopic      Multiple      Live Births  2            Family History  Problem Relation Age of Onset  . Coronary artery disease Mother   . Hypertension Mother   . Stroke Mother   . Diabetes Paternal Grandmother   . Diabetes Paternal Uncle   . Anesthesia problems Neg Hx   . Malignant hyperthermia Neg Hx   . Hypotension Neg Hx   . Pseudochol deficiency Neg Hx     Social History   Tobacco Use  . Smoking status: Current Every Day Smoker    Packs/day: 0.25    Years: 20.00    Pack years: 5.00    Types: Cigarettes    Last attempt to quit: 12/25/2013    Years since quitting: 7.0  . Smokeless tobacco: Never Used  Vaping Use  . Vaping Use: Never used  Substance Use Topics  . Alcohol use: Yes    Comment: occ  . Drug use: No    Home Medications Prior to Admission medications   Medication Sig Start Date End Date Taking? Authorizing Provider  ALPRAZolam Prudy Feeler) 0.5 MG tablet Take 0.5 mg by mouth daily as needed for anxiety.  03/24/14   [provider]  buPROPion (WELLBUTRIN XL) 300 MG 24 hr tablet Take 300 mg by mouth daily. 03/01/16   [provider]  EPINEPHrine 0.3 mg/0.3 mL IJ SOAJ injection Inject 0.3 mg into the muscle once.    [provider]  ibuprofen (ADVIL,MOTRIN) 600 MG tablet Take 1 tablet (600 mg total) by mouth every 6 (six) hours as needed. 05/01/18   Eber Hong, MD  levothyroxine (SYNTHROID, LEVOTHROID) 88 MCG tablet Take 88 mcg by mouth daily before breakfast.  02/11/18   [provider]  liothyronine (CYTOMEL) 5 MCG tablet Take 5 mcg by mouth daily.  02/11/18   [provider]  losartan-hydrochlorothiazide (HYZAAR) 100-12.5 MG tablet Take 1 tablet by mouth daily.  03/21/17   [provider]  Multiple Vitamin (MULTIVITAMIN) capsule Take 1 capsule by mouth daily.    [provider]  PREDNISONE PO Take by mouth.    [provider]  TIZANIDINE HCL PO Take by mouth.    [provider]  valACYclovir (VALTREX) 1000 MG tablet Take 1 tablet  (1,000 mg total) by mouth 3 (three) times daily. 04/22/20   Hall-Potvin, [place], PA-C  VITAMIN D PO Take by mouth.    [provider]    Allergies    Bee venom, Celexa [citalopram hydrobromide], Coconut oil, and Penicillins  Review of Systems   Review of Systems  Constitutional: Positive for diaphoresis. Negative for fever.  Eyes: Negative for visual disturbance.  Respiratory: Positive for shortness of breath. Negative for cough.   Cardiovascular: Positive for chest pain.  Gastrointestinal: Positive for nausea. Negative for abdominal pain and vomiting.  Genitourinary: Negative for difficulty urinating and dysuria.  Musculoskeletal: Positive for back pain. Negative for neck pain.  Skin: Negative for rash.  Neurological: Positive for light-headedness. Negative for syncope and headaches.    Physical Exam Updated Vital Signs BP 125/87 (BP Location: Right Arm)   Pulse 68   Temp 97.7 F (36.5 C) (Oral)   Resp 18   Ht 5\' 3"  (1.6 m)   Wt 86.2 kg   LMP 07/10/2011   SpO2 99%   BMI 33.66 kg/m   Physical Exam Vitals and nursing note reviewed.  Constitutional:      General: She is not in acute distress.    Appearance: She is well-developed. She is not diaphoretic.  HENT:     Head: Normocephalic and atraumatic.  Eyes:     Conjunctiva/sclera: Conjunctivae normal.  Cardiovascular:     Rate and Rhythm: Normal rate and regular rhythm.     Heart sounds: Normal heart sounds. No murmur heard. No friction rub. No gallop.   Pulmonary:     Effort: Pulmonary effort is normal. No respiratory distress.     Breath sounds: Normal breath sounds. No wheezing or rales.  Abdominal:     General: There is no distension.     Palpations: Abdomen is soft.     Tenderness: There is abdominal tenderness. There is no guarding.  Musculoskeletal:        General: No tenderness.     Cervical back: Normal range of motion.  Skin:    General: Skin is warm and dry.     Findings: No erythema or  rash.  Neurological:     Mental Status: She is alert and oriented to person, place, and time.     ED Results / Procedures / Treatments   Labs (all labs ordered are listed, but only abnormal results are displayed) Labs Reviewed  CBC WITH DIFFERENTIAL/PLATELET - Abnormal; Notable for the following components:      Result Value   Hemoglobin 11.1 (*)    HCT 35.8 (*)    All other components within normal limits  COMPREHENSIVE METABOLIC PANEL - Abnormal; Notable for the following components:   Glucose, Bld 106 (*)    All other components within normal limits  COMPREHENSIVE METABOLIC PANEL - Abnormal; Notable for the following components:   Glucose, Bld 107 (*)    All other components within normal limits  RESP PANEL BY RT-PCR (FLU A&B, COVID) ARPGX2  SURGICAL PCR SCREEN  LIPASE, BLOOD  HIV ANTIBODY (ROUTINE TESTING W REFLEX)  TROPONIN I (HIGH SENSITIVITY)  TROPONIN I (HIGH SENSITIVITY)    EKG EKG Interpretation  Date/Time:  Monday December 25 2020 12:00:01 EDT Ventricular Rate:  100 PR Interval:  161 QRS Duration: 103 QT Interval:  356 QTC Calculation: 460 R Axis:   24 Text Interpretation: Sinus tachycardia Consider left atrial enlargement Since prior ECG< rate has increased, no significant abnormality Confirmed by 12-08-2002 (Alvira Monday) on 12/25/2020 12:03:53 PM   Radiology DG Chest Portable 1 View  Result Date: 12/25/2020 CLINICAL DATA:  Right-sided chest pain extending into the shoulder and jaw. EXAM: PORTABLE CHEST 1 VIEW COMPARISON:  04/22/2020 FINDINGS: The cardiomediastinal silhouette is unchanged with normal heart size. The lungs are hypoinflated. No airspace consolidation, edema, sizable pleural effusion,  or pneumothorax is identified. No acute osseous abnormality is seen. IMPRESSION: No active disease. Electronically Signed   By: Sebastian AcheAllen  Grady M.D.   On: 12/25/2020 12:33   CT Angio Chest/Abd/Pel for Dissection W and/or Wo Contrast  Result Date: 12/25/2020 CLINICAL  DATA:  Patient complains of right jaw and neck pain that radiates into arm. Chest pain radiates to back. Rule out aortic dissection. EXAM: CT ANGIOGRAPHY CHEST, ABDOMEN AND PELVIS TECHNIQUE: Non-contrast CT of the chest was initially obtained. Multidetector CT imaging through the chest, abdomen and pelvis was performed using the standard protocol during bolus administration of intravenous contrast. Multiplanar reconstructed images and MIPs were obtained and reviewed to evaluate the vascular anatomy. CONTRAST:  100mL OMNIPAQUE IOHEXOL 350 MG/ML SOLN COMPARISON:  None. FINDINGS: CTA CHEST FINDINGS Cardiovascular: Preferential opacification of the thoracic aorta. No evidence of thoracic aortic aneurysm or dissection. Normal heart size. No pericardial effusion. Aortic atherosclerosis identified. Calcification noted within the LAD coronary artery. The main pulmonary artery appears patent. No central obstructing embolus. Mediastinum/Nodes: Thyroidectomy. The trachea appears patent and is midline. Normal appearance of the esophagus. No enlarged lymph nodes. Lungs/Pleura: No pleural effusion. No airspace consolidation, atelectasis or pneumothorax. No signs of interstitial edema. Musculoskeletal: No chest wall abnormality. No acute or significant osseous findings. Review of the MIP images confirms the above findings. CTA ABDOMEN AND PELVIS FINDINGS VASCULAR Aorta: Normal caliber aorta without aneurysm, dissection, vasculitis or significant stenosis. Mild aortic atherosclerotic calcifications noted. Celiac: Patent without evidence of aneurysm, dissection, vasculitis or significant stenosis. SMA: Patent without evidence of aneurysm, dissection, vasculitis or significant stenosis. Renals: Both renal arteries are patent without evidence of aneurysm, dissection, vasculitis, fibromuscular dysplasia or significant stenosis. IMA: Patent without evidence of aneurysm, dissection, vasculitis or significant stenosis. Inflow: Patent  without evidence of aneurysm, dissection, vasculitis or significant stenosis. Veins: No obvious venous abnormality within the limitations of this arterial phase study. Review of the MIP images confirms the above findings. NON-VASCULAR Hepatobiliary: Diffuse gallbladder wall thickening is identified with mucosal enhancement. Wall thickness measures up to 8 mm, image 150/10. No CT visible stones identified. No bile duct dilatation. Pancreas: Unremarkable. No pancreatic ductal dilatation or surrounding inflammatory changes. Spleen: Normal in size without focal abnormality. Adrenals/Urinary Tract: Adrenal glands are unremarkable. Kidneys are normal, without renal calculi, focal lesion, or hydronephrosis. Bladder is unremarkable. Stomach/Bowel: Stomach is within normal limits. Appendix appears normal. No evidence of bowel wall thickening, distention, or inflammatory changes. Lymphatic: No adenopathy. Reproductive: Status post hysterectomy. No adnexal masses. Other: No free fluid or fluid collections. Musculoskeletal: Degenerative disc disease noted within the lumbar spine. This is most advanced at L4-5 and L5-S1. No acute or suspicious osseous findings. Review of the MIP images confirms the above findings. IMPRESSION: 1. No signs of aortic dissection. No acute cardiopulmonary abnormality noted. 2. Gallbladder wall thickening and mucosal enhancement. This is nonspecific and may reflect incomplete distention. If there are clinical signs or symptoms of acute cholecystitis consider further evaluation with gallbladder sonogram. 3. Aortic Atherosclerosis (ICD10-I70.0). Lad coronary artery calcification. Electronically Signed   By: Signa Kellaylor  Stroud M.D.   On: 12/25/2020 15:38   US Abdomen Limited RUQ (LIVER/GB)  Result Date: 12/25/2020 CLINICAL DATA:  Possible inflammatory changes of the gallbladder on recent CT. EXAM: ULTRASOUND ABDOMEN LIMITED RIGHT UPPER QUADRANT COMPARISON:  CT scan from earlier in the same day. FINDINGS:  Gallbladder: Gallbladder is partially distended with wall thickening to 7 mm. Calculi are seen. No pericholecystic fluid is noted. Common bile duct: Diameter: 5 mm. Liver:  Diffuse increased echogenicity is noted likely related to fatty infiltration. An area suggestive of focal fatty sparing is noted adjacent to the gallbladder fossa. Portal vein is patent on color Doppler imaging with normal direction of blood flow towards the liver. Other: None. IMPRESSION: Cholelithiasis with gallbladder wall thickening. These changes are suspicious for acute cholecystitis. HIDA scan would be confirmatory if clinically indicated. Fatty liver with area of focal fatty sparing. Electronically Signed   By: Alcide Clever M.D.   On: 12/25/2020 16:45    Procedures Procedures   Medications Ordered in ED Medications  nitroGLYCERIN (NITROSTAT) SL tablet 0.4 mg (0.4 mg Sublingual Given 12/25/20 1219)  ibuprofen (ADVIL) tablet 600 mg (600 mg Oral Given 12/26/20 0239)  valACYclovir (VALTREX) tablet 1,000 mg (1,000 mg Oral Patient Refused/Not Given 12/25/20 2339)  ALPRAZolam Prudy Feeler) tablet 0.5 mg (has no administration in time range)  buPROPion (WELLBUTRIN XL) 24 hr tablet 300 mg (has no administration in time range)  levothyroxine (SYNTHROID) tablet 88 mcg (88 mcg Oral Given 12/26/20 0537)  heparin injection 5,000 Units (5,000 Units Subcutaneous Given 12/26/20 0537)  lactated ringers infusion ( Intravenous Infusion Verify 12/26/20 2841)  diphenhydrAMINE (BENADRYL) 12.5 MG/5ML elixir 12.5 mg (has no administration in time range)    Or  diphenhydrAMINE (BENADRYL) injection 12.5 mg (has no administration in time range)  methocarbamol (ROBAXIN) tablet 500 mg (has no administration in time range)  ondansetron (ZOFRAN-ODT) disintegrating tablet 4 mg ( Oral See Alternative 12/25/20 2003)    Or  ondansetron (ZOFRAN) injection 4 mg (4 mg Intravenous Given 12/25/20 2003)  simethicone (MYLICON) chewable tablet 40 mg (has no administration  in time range)  docusate sodium (COLACE) capsule 100 mg (100 mg Oral Patient Refused/Not Given 12/25/20 2131)  hydrALAZINE (APRESOLINE) injection 10 mg (has no administration in time range)  ciprofloxacin (CIPRO) IVPB 400 mg (400 mg Intravenous New Bag/Given 12/26/20 3244)  traMADol (ULTRAM) tablet 50 mg (has no administration in time range)  HYDROmorphone (DILAUDID) injection 0.5 mg (0.5 mg Intravenous Given 12/25/20 2003)  losartan (COZAAR) tablet 100 mg (has no administration in time range)    And  hydrochlorothiazide (MICROZIDE) capsule 12.5 mg (has no administration in time range)  liothyronine (CYTOMEL) tablet 5 mcg (5 mcg Oral Given 12/26/20 0537)  acetaminophen (TYLENOL) tablet 1,000 mg (1,000 mg Oral Given 12/26/20 0537)  promethazine (PHENERGAN) 12.5 mg in sodium chloride 0.9 % 50 mL IVPB (has no administration in time range)  ondansetron (ZOFRAN) injection 4 mg (4 mg Intravenous Given 12/25/20 1215)  iohexol (OMNIPAQUE) 350 MG/ML injection 100 mL (100 mLs Intravenous Contrast Given 12/25/20 1517)  morphine 4 MG/ML injection 4 mg (4 mg Intravenous Given 12/25/20 1606)  ondansetron (ZOFRAN) injection 4 mg (4 mg Intravenous Given 12/25/20 1606)  morphine 4 MG/ML injection 4 mg (4 mg Intravenous Given 12/25/20 1739)  cefTRIAXone (ROCEPHIN) 2 g in sodium chloride 0.9 % 100 mL IVPB (0 g Intravenous Stopped 12/25/20 1810)    ED Course  I have reviewed the triage vital signs and the nursing notes.  Pertinent labs & imaging results that were available during my care of the patient were reviewed by me and considered in my medical decision making (see chart for details).    MDM Rules/Calculators/A&P                           50 year old female with a history of hypertension, family history of early heart disease, presents with concern for chest pain from the  urgent care.  EKG was evaluated and showed no sign of acute abnormalities-no acute ST changes or findings of pericarditis.  Chest x-ray  showed no acute abnormalities.  Initial troponin negative.  Low suspicion for pulmonary embolus by history and exam.  Reported improvement of pain to the jaw with nitroglycerin.  No sign of hepatitis or pancreatitis.  Initial history describes some typical for coronary artery disease, however then reports stabbing pain through to the back, and has episodic episodes of "spasm", and noted to have right upper quadrant tenderness on exam.  CT dissection study was done to evaluate for signs of dissection given description of pain, with plan to also screen for cholecystitis on CT.  Care signed out to Dr. Pecola Leisure with CT pending.  Final Clinical Impression(s) / ED Diagnoses Final diagnoses:  RUQ abdominal pain  Acute cholecystitis    Rx / DC Orders ED Discharge Orders    None       Alvira Monday, MD 12/26/20 [phone]

## 2020-12-25 NOTE — ED Provider Notes (Signed)
[facility]    CSN: [id number] Arrival date & time: 12/25/20  1048      History   Chief Complaint Chief Complaint  Patient presents with  . Chest Pain  . Shortness of Breath  . Nausea    HPI [name] is a 50 y.o. female.   HPI  Patient presents today with chest pain which has been present since 3 AM this morning.  Chest pain has been localized to the mid chest however has been radiating intermittently to her right arm or right jaw.  She reports at 3 AM she just had a severe crushing chest pain without radiation however she attempted to run some errands this morning and the chest pain has progressively worsened and now include shortness of breath.  Patient's medical history significant for anxiety, asthma and hypertension.  Patient is currently prescribed lisinopril and has taken her blood pressure medication today.  She is currently not on aspirin and has not taken any type of NSAIDs today for pain.  She also endorses some nausea and has not had any vomiting.  Denies any exacerbating factors that may precipitated chest pain.  Denies any recent travel Past Medical History:  Diagnosis Date  . Allergy   . Anemia   . Anxiety   . Arthritis   . Asthma    with contact with cats  . BV (bacterial vaginosis) 08/09/2013  . Family history of adverse reaction to anesthesia    mother was sick for days  . Hypertension   . PONV (postoperative nausea and vomiting)    sts with ablation she was given meds for nausea and "got sicker".  . Vaginal bleeding 08/09/2013   Had supra cervical hyst 2012, has had bleeding after sex  . Venereal disease 1990   chlamydia  . Wears glasses     Patient Active Problem List   Diagnosis Date Noted  . Multinodular goiter (nontoxic) 09/19/2016  . Multiple thyroid nodules 09/11/2016  . Obese 03/18/2016  . Smoker 03/18/2016  . Thyroid nodule 03/18/2016  . Perimenopausal 03/18/2016  . Hypertension 08/09/2013  . Vaginal bleeding 08/09/2013   . BV (bacterial vaginosis) 08/09/2013  . Stress fracture of foot with nonunion 06/02/2013  . Infection of right foot 04/01/2013  . Right foot pain 04/01/2013    Past Surgical History:  Procedure Laterality Date  . ABDOMINAL HYSTERECTOMY    . BREAST CYST EXCISION Left    35 yrs ago- benign  . BREAST SURGERY     lump removed from left breast back in the 1980's  . BUNIONECTOMY  11/14   rt  . CESAREAN SECTION  1995  . ENDOMETRIAL ABLATION  2010   [facility]  . FOOT ARTHROPLASTY  11/15   rt-revision bunionectomy-  . FOOT SURGERY    . FRACTURE SURGERY  2005   left ankle-[facility]  . HAMMER TOE SURGERY Right 12/01/2014   Procedure: REVISION OF RIGHT 3RD HAMMER TOE CORRECTION;  Surgeon: Toni Arthurs, MD;  Location: [facility];  Service: Orthopedics;  Laterality: Right;  . JOINT REPLACEMENT     big toe joint removed  . LAPAROSCOPIC SUPRACERVICAL HYSTERECTOMY  07/17/2011   Procedure: LAPAROSCOPIC SUPRACERVICAL HYSTERECTOMY;  Surgeon: Lazaro Arms, MD;  Location: [facility];  Service: Gynecology;  Laterality: N/A;  . REMOVAL OF IMPLANT Right 12/01/2014   Procedure: REMOVAL OF  DEEP IMPLANT FROM RIGHT 3RD TOE;  Surgeon: Toni Arthurs, MD;  Location: [facility];  Service: Orthopedics;  Laterality: Right;  .  THYROIDECTOMY N/A 09/19/2016   Procedure: TOTAL THYROIDECTOMY;  Surgeon: Darnell Level, MD;  Location: [facility];  Service: General;  Laterality: N/A;    OB History    Gravida  5   Para  2   Term  1   Preterm  1   AB  3   Living  1     SAB  3   IAB      Ectopic      Multiple      Live Births  2            Home Medications    Prior to Admission medications   Medication Sig Start Date End Date Taking? Authorizing Provider  ALPRAZolam Prudy Feeler) 0.5 MG tablet Take 0.5 mg by mouth daily as needed for anxiety.  03/24/14   [provider]  buPROPion (WELLBUTRIN XL) 300 MG 24 hr tablet Take 300 mg by mouth daily. 03/01/16   [provider]   EPINEPHrine 0.3 mg/0.3 mL IJ SOAJ injection Inject 0.3 mg into the muscle once.    [provider]  ibuprofen (ADVIL,MOTRIN) 600 MG tablet Take 1 tablet (600 mg total) by mouth every 6 (six) hours as needed. 05/01/18   Eber Hong, MD  levothyroxine (SYNTHROID, LEVOTHROID) 88 MCG tablet Take 88 mcg by mouth daily before breakfast.  02/11/18   [provider]  liothyronine (CYTOMEL) 5 MCG tablet Take 5 mcg by mouth daily.  02/11/18   [provider]  losartan-hydrochlorothiazide (HYZAAR) 100-12.5 MG tablet Take 1 tablet by mouth daily.  03/21/17   [provider]  Multiple Vitamin (MULTIVITAMIN) capsule Take 1 capsule by mouth daily.    [provider]  PREDNISONE PO Take by mouth.    [provider]  TIZANIDINE HCL PO Take by mouth.    [provider]  valACYclovir (VALTREX) 1000 MG tablet Take 1 tablet (1,000 mg total) by mouth 3 (three) times daily. 04/22/20   Hall-Potvin, [place], PA-C  VITAMIN D PO Take by mouth.    [provider]    Family History Family History  Problem Relation Age of Onset  . Coronary artery disease Mother   . Hypertension Mother   . Stroke Mother   . Diabetes Paternal Grandmother   . Diabetes Paternal Uncle   . Anesthesia problems Neg Hx   . Malignant hyperthermia Neg Hx   . Hypotension Neg Hx   . Pseudochol deficiency Neg Hx     Social History Social History   Tobacco Use  . Smoking status: Current Every Day Smoker    Packs/day: 0.25    Years: 20.00    Pack years: 5.00    Types: Cigarettes    Last attempt to quit: 12/25/2013    Years since quitting: 7.0  . Smokeless tobacco: Never Used  Vaping Use  . Vaping Use: Never used  Substance Use Topics  . Alcohol use: Yes    Comment: occ  . Drug use: No     Allergies   Bee venom, Celexa [citalopram hydrobromide], Coconut oil, and Penicillins   Review of Systems Review of Systems Pertinent negatives listed in HPI   Physical  Exam Triage Vital Signs ED Triage Vitals  Enc Vitals Group     BP 12/25/20 1052 (!) 138/94     Pulse Rate 12/25/20 1052 (!) 104     Resp 12/25/20 1052 17     Temp --      Temp Source 12/25/20 1052 Oral  SpO2 12/25/20 1052 99 %     Weight --      Height --      Head Circumference --      Peak Flow --      Pain Score 12/25/20 1053 3     Pain Loc --      Pain Edu? --      Excl. in GC? --    No data found.  Updated Vital Signs BP (!) 138/94 (BP Location: Right Arm)   Pulse (!) 104   Resp 17   LMP 07/10/2011   SpO2 99%   Visual Acuity Right Eye Distance:   Left Eye Distance:   Bilateral Distance:    Right Eye Near:   Left Eye Near:    Bilateral Near:     Physical Exam Constitutional:      Appearance: She is obese. She is ill-appearing.  HENT:     Head: Normocephalic.  Cardiovascular:     Rate and Rhythm: Regular rhythm. Tachycardia present.     Pulses:          Radial pulses are 2+ on the right side.  Pulmonary:     Effort: Tachypnea present.     Breath sounds: Normal breath sounds. No decreased breath sounds.  Musculoskeletal:     Cervical back: Normal range of motion.  Skin:    Capillary Refill: Capillary refill takes less than 2 seconds.  Neurological:     General: No focal deficit present.     Mental Status: She is oriented to person, place, and time.     Motor: No weakness.      UC Treatments / Results  Labs (all labs ordered are listed, but only abnormal results are displayed) Labs Reviewed - No data to display  EKG BPM 106, LVH, no acute ST elevation or depression   Radiology No results found.  Procedures Procedures (including critical care time)  Medications Ordered in UC Medications  aspirin chewable tablet 324 mg (has no administration in time range)    Initial Impression / Assessment and Plan / UC Course  I have reviewed the triage vital signs and the nursing notes.  Pertinent labs & imaging results that were available during  my care of the patient were reviewed by me and considered in my medical decision making (see chart for details).     Patient presents today with acute chest pain with shortness of breath and nausea, with chest pain radiating into right upper extremity, right lateral neck patient given ASA 325, placed on 2 L of oxygen, ECG obtained, sinus tachycardia with LVH no acute ST depression or elevation.  Patient remained conscious although complained of sensation of feeling that she was going to pass out.  EMS arrived assumed care of patient to be transported to the ER.   Final Clinical Impressions(s) / UC Diagnoses   Final diagnoses:  Acute chest pain  SOB (shortness of breath)  Nausea without vomiting   Discharge Instructions   None    ED Prescriptions    None     PDMP not reviewed this encounter.   Shabrea, [place], FNP 12/25/20 1120

## 2020-12-25 NOTE — ED Triage Notes (Signed)
Patient is being discharged from the Urgent Care and sent to the Emergency Department via ems . Per Jerrilyn Cairo PA, patient is in need of higher level of care due to chest pain, abnormal ekg. Patient is aware and verbalizes understanding of plan of care.  Vitals:   12/25/20 1052  BP: (!) 138/94  Pulse: (!) 104  Resp: 17  SpO2: 99%

## 2020-12-25 NOTE — H&P (Signed)
CC: RUQ Pain, acute cholecystitis  Requesting provider: Dr. Madilyn Hook  HPI: Doris Davis is an 50 y.o. female with hx of HTN, allergies, postop n/v whom presented to ED for evaluation of RUQ pain which began yesterday. Associated subjective fevers and chills. Has had some similar attacks before although less severe and is aware she has gallstones - believes 20+yrs ago after her child was born she was told she should have her gallbladder removed but managed to keep things under better control with dietary changes. Reports anytime she eats greasy/fatty foods she gets nausea, severe bloating and crampy RUQ pains. Current attack is described as labor pain in her RUQ. Severe sharp cramp that has persisted.  PSH: Hysterectomy, laparoscopic Total thyroidectomy by Dr. Gerrit Friends 07/2017 Multiple orthopedic lower ext surgeries  Past Medical History:  Diagnosis Date  . Allergy   . Anemia   . Anxiety   . Arthritis   . Asthma    with contact with cats  . BV (bacterial vaginosis) 08/09/2013  . Family history of adverse reaction to anesthesia    mother was sick for days  . Hypertension   . PONV (postoperative nausea and vomiting)    sts with ablation she was given meds for nausea and "got sicker".  . Vaginal bleeding 08/09/2013   Had supra cervical hyst 2012, has had bleeding after sex  . Venereal disease 1990   chlamydia  . Wears glasses     Past Surgical History:  Procedure Laterality Date  . ABDOMINAL HYSTERECTOMY    . BREAST CYST EXCISION Left    35 yrs ago- benign  . BREAST SURGERY     lump removed from left breast back in the 1980's  . BUNIONECTOMY  11/14   rt  . CESAREAN SECTION  1995  . ENDOMETRIAL ABLATION  2010   [facility]  . FOOT ARTHROPLASTY  11/15   rt-revision bunionectomy-  . FOOT SURGERY    . FRACTURE SURGERY  2005   left ankle-[place]  . HAMMER TOE SURGERY Right 12/01/2014   Procedure: REVISION OF RIGHT 3RD HAMMER TOE CORRECTION;  Surgeon: Toni Arthurs, MD;  Location: [facility];  Service: Orthopedics;  Laterality: Right;  . JOINT REPLACEMENT     big toe joint removed  . LAPAROSCOPIC SUPRACERVICAL HYSTERECTOMY  07/17/2011   Procedure: LAPAROSCOPIC SUPRACERVICAL HYSTERECTOMY;  Surgeon: Lazaro Arms, MD;  Location: [facility];  Service: Gynecology;  Laterality: N/A;  . REMOVAL OF IMPLANT Right 12/01/2014   Procedure: REMOVAL OF  DEEP IMPLANT FROM RIGHT 3RD TOE;  Surgeon: Toni Arthurs, MD;  Location: [facility];  Service: Orthopedics;  Laterality: Right;  . THYROIDECTOMY N/A 09/19/2016   Procedure: TOTAL THYROIDECTOMY;  Surgeon: Darnell Level, MD;  Location[facility];  Service: General;  Laterality: N/A;    Family History  Problem Relation Age of Onset  . Coronary artery disease Mother   . Hypertension Mother   . Stroke Mother   . Diabetes Paternal Grandmother   . Diabetes Paternal Uncle   . Anesthesia problems Neg Hx   . Malignant hyperthermia Neg Hx   . Hypotension Neg Hx   . Pseudochol deficiency Neg Hx     Social:  reports that she has been smoking cigarettes. She has a 5.00 pack-year smoking history. She has never used smokeless tobacco. She reports current alcohol use. She reports that she does not use drugs.  Allergies:  Allergies  Allergen Reactions  . Bee Venom Anaphylaxis  . Celexa [Citalopram Hydrobromide]  Anaphylaxis  . Coconut Oil Anaphylaxis  . Penicillins Anaphylaxis    Has patient had a PCN reaction causing immediate rash, facial/tongue/throat swelling, SOB or lightheadedness with hypotension: Yes Has patient had a PCN reaction causing severe rash involving mucus membranes or skin necrosis: No Has patient had a PCN reaction that required hospitalization No Has patient had a PCN reaction occurring within the last 10 years: No If all of the above answers are "NO", then may proceed with Cephalosporin use.     Medications: I have reviewed the patient's current medications.  Results for orders placed or performed  during the hospital encounter of 12/25/20 (from the past 48 hour(s))  CBC with Differential     Status: Abnormal   Collection Time: 12/25/20 12:05 PM  Result Value Ref Range   WBC 7.2 4.0 - 10.5 K/uL   RBC 4.25 3.87 - 5.11 MIL/uL   Hemoglobin 11.1 (L) 12.0 - 15.0 g/dL   HCT 16.1 (L) 09.6 - 04.5 %   MCV 84.2 80.0 - 100.0 fL   MCH 26.1 26.0 - 34.0 pg   MCHC 31.0 30.0 - 36.0 g/dL   RDW 40.9 81.1 - 91.4 %   Platelets 266 150 - 400 K/uL   nRBC 0.0 0.0 - 0.2 %   Neutrophils Relative % 51 %   Neutro Abs 3.6 1.7 - 7.7 K/uL   Lymphocytes Relative 37 %   Lymphs Abs 2.6 0.7 - 4.0 K/uL   Monocytes Relative 7 %   Monocytes Absolute 0.5 0.1 - 1.0 K/uL   Eosinophils Relative 5 %   Eosinophils Absolute 0.4 0.0 - 0.5 K/uL   Basophils Relative 0 %   Basophils Absolute 0.0 0.0 - 0.1 K/uL   Immature Granulocytes 0 %   Abs Immature Granulocytes 0.02 0.00 - 0.07 K/uL    Comment: Performed at [facility], [address], [place], [place] [address]  Comprehensive metabolic panel     Status: Abnormal   Collection Time: 12/25/20 12:05 PM  Result Value Ref Range   Sodium 139 135 - 145 mmol/L   Potassium 3.5 3.5 - 5.1 mmol/L   Chloride 102 98 - 111 mmol/L   CO2 27 22 - 32 mmol/L   Glucose, Bld 106 (H) 70 - 99 mg/dL    Comment: Glucose reference range applies only to samples taken after fasting for at least 8 hours.   BUN 11 6 - 20 mg/dL   Creatinine, Ser 6.21 0.44 - 1.00 mg/dL   Calcium 30.8 8.9 - 65.7 mg/dL   Total Protein 7.5 6.5 - 8.1 g/dL   Albumin 4.3 3.5 - 5.0 g/dL   AST 24 15 - 41 U/L   ALT 26 0 - 44 U/L   Alkaline Phosphatase 104 38 - 126 U/L   Total Bilirubin 0.5 0.3 - 1.2 mg/dL   GFR, Estimated >84 >69 mL/min    Comment: (NOTE) Calculated using the CKD-EPI Creatinine Equation (2021)    Anion gap 10 5 - 15    Comment: Performed at [facility], [address] [place], [place] [address]  Troponin I (High Sensitivity)     Status: None   Collection Time: 12/25/20 12:05 PM   Result Value Ref Range   Troponin I (High Sensitivity) 4 <18 ng/L    Comment: (NOTE) Elevated high sensitivity troponin I (hsTnI) values and significant  changes across serial measurements may suggest ACS but many other  chronic and acute conditions are known to elevate hsTnI results.  Refer to the "Links" section for chest pain algorithms and additional  guidance. Performed at [facility], [address], [place], [place] [address]   Lipase, blood     Status: None   Collection Time: 12/25/20 12:05 PM  Result Value Ref Range   Lipase 45 11 - 51 U/L    Comment: Performed at [facility], [address] [place], [place] [address]  Troponin I (High Sensitivity)     Status: None   Collection Time: 12/25/20  2:25 PM  Result Value Ref Range   Troponin I (High Sensitivity) 3 <18 ng/L    Comment: (NOTE) Elevated high sensitivity troponin I (hsTnI) values and significant  changes across serial measurements may suggest ACS but many other  chronic and acute conditions are known to elevate hsTnI results.  Refer to the "Links" section for chest pain algorithms and additional  guidance. Performed at [facility], [address] [place], [place] [address]     DG Chest Portable 1 View  Result Date: 12/25/2020 CLINICAL DATA:  Right-sided chest pain extending into the shoulder and jaw. EXAM: PORTABLE CHEST 1 VIEW COMPARISON:  04/22/2020 FINDINGS: The cardiomediastinal silhouette is unchanged with normal heart size. The lungs are hypoinflated. No airspace consolidation, edema, sizable pleural effusion, or pneumothorax is identified. No acute osseous abnormality is seen. IMPRESSION: No active disease. Electronically Signed   By: Sebastian Ache M.D.   On: 12/25/2020 12:33   CT Angio Chest/Abd/Pel for Dissection W and/or Wo Contrast  Result Date: 12/25/2020 CLINICAL DATA:  Patient complains of right jaw and neck pain that radiates into arm. Chest pain radiates to back. Rule out  aortic dissection. EXAM: CT ANGIOGRAPHY CHEST, ABDOMEN AND PELVIS TECHNIQUE: Non-contrast CT of the chest was initially obtained. Multidetector CT imaging through the chest, abdomen and pelvis was performed using the standard protocol during bolus administration of intravenous contrast. Multiplanar reconstructed images and MIPs were obtained and reviewed to evaluate the vascular anatomy. CONTRAST:  OMNIPAQUE IOHEXOL 350 MG/ML SOLN COMPARISON:  None. FINDINGS: CTA CHEST FINDINGS Cardiovascular: Preferential opacification of the thoracic aorta. No evidence of thoracic aortic aneurysm or dissection. Normal heart size. No pericardial effusion. Aortic atherosclerosis identified. Calcification noted within the LAD coronary artery. The main pulmonary artery appears patent. No central obstructing embolus. Mediastinum/Nodes: Thyroidectomy. The trachea appears patent and is midline. Normal appearance of the esophagus. No enlarged lymph nodes. Lungs/Pleura: No pleural effusion. No airspace consolidation, atelectasis or pneumothorax. No signs of interstitial edema. Musculoskeletal: No chest wall abnormality. No acute or significant osseous findings. Review of the MIP images confirms the above findings. CTA ABDOMEN AND PELVIS FINDINGS VASCULAR Aorta: Normal caliber aorta without aneurysm, dissection, vasculitis or significant stenosis. Mild aortic atherosclerotic calcifications noted. Celiac: Patent without evidence of aneurysm, dissection, vasculitis or significant stenosis. SMA: Patent without evidence of aneurysm, dissection, vasculitis or significant stenosis. Renals: Both renal arteries are patent without evidence of aneurysm, dissection, vasculitis, fibromuscular dysplasia or significant stenosis. IMA: Patent without evidence of aneurysm, dissection, vasculitis or significant stenosis. Inflow: Patent without evidence of aneurysm, dissection, vasculitis or significant stenosis. Veins: No obvious venous abnormality  within the limitations of this arterial phase study. Review of the MIP images confirms the above findings. NON-VASCULAR Hepatobiliary: Diffuse gallbladder wall thickening is identified with mucosal enhancement. Wall thickness measures up to 8 mm, image 150/10. No CT visible stones identified. No bile duct dilatation. Pancreas: Unremarkable. No pancreatic ductal dilatation or surrounding inflammatory changes. Spleen: Normal in size  without focal abnormality. Adrenals/Urinary Tract: Adrenal glands are unremarkable. Kidneys are normal, without renal calculi, focal lesion, or hydronephrosis. Bladder is unremarkable. Stomach/Bowel: Stomach is within normal limits. Appendix appears normal. No evidence of bowel wall thickening, distention, or inflammatory changes. Lymphatic: No adenopathy. Reproductive: Status post hysterectomy. No adnexal masses. Other: No free fluid or fluid collections. Musculoskeletal: Degenerative disc disease noted within the lumbar spine. This is most advanced at L4-5 and L5-S1. No acute or suspicious osseous findings. Review of the MIP images confirms the above findings. IMPRESSION: 1. No signs of aortic dissection. No acute cardiopulmonary abnormality noted. 2. Gallbladder wall thickening and mucosal enhancement. This is nonspecific and may reflect incomplete distention. If there are clinical signs or symptoms of acute cholecystitis consider further evaluation with gallbladder sonogram. 3. Aortic Atherosclerosis (ICD10-I70.0). Lad coronary artery calcification. Electronically Signed   By: Signa Kell M.D.   On: 12/25/2020 15:38   US Abdomen Limited RUQ (LIVER/GB)  Result Date: 12/25/2020 CLINICAL DATA:  Possible inflammatory changes of the gallbladder on recent CT. EXAM: ULTRASOUND ABDOMEN LIMITED RIGHT UPPER QUADRANT COMPARISON:  CT scan from earlier in the same day. FINDINGS: Gallbladder: Gallbladder is partially distended with wall thickening to 7 mm. Calculi are seen. No pericholecystic  fluid is noted. Common bile duct: Diameter: 5 mm. Liver: Diffuse increased echogenicity is noted likely related to fatty infiltration. An area suggestive of focal fatty sparing is noted adjacent to the gallbladder fossa. Portal vein is patent on color Doppler imaging with normal direction of blood flow towards the liver. Other: None. IMPRESSION: Cholelithiasis with gallbladder wall thickening. These changes are suspicious for acute cholecystitis. HIDA scan would be confirmatory if clinically indicated. Fatty liver with area of focal fatty sparing. Electronically Signed   By: Alcide Clever M.D.   On: 12/25/2020 16:45    ROS - all of the below systems have been reviewed with the patient and positives are indicated with bold text General: chills, fever or night sweats Eyes: blurry vision or double vision ENT: epistaxis or sore throat Allergy/Immunology: itchy/watery eyes or nasal congestion Hematologic/Lymphatic: bleeding problems, blood clots or swollen lymph nodes Endocrine: temperature intolerance or unexpected weight changes Breast: new or changing breast lumps or nipple discharge Resp: cough, shortness of breath, or wheezing CV: chest pain or dyspnea on exertion GI: as per HPI GU: dysuria, trouble voiding, or hematuria MSK: joint pain or joint stiffness Neuro: TIA or stroke symptoms Derm: pruritus and skin lesion changes Psych: anxiety and depression  PE Blood pressure (!) 134/97, pulse 96, temperature 98.2 F (36.8 C), temperature source Oral, resp. rate 13, height 5\' 3"  (1.6 m), weight 86.2 kg, last menstrual period 07/10/2011, SpO2 97 %. Constitutional: NAD; conversant; no deformities; wearing mask Eyes: Moist conjunctiva; no lid lag; anicteric; PERRL Neck: Trachea midline; no thyromegaly Lungs: Normal respiratory effort; no tactile fremitus CV: RRR; no palpable thrills; no pitting edema GI: Abd soft, focally ttp in RUQ; no rebound nor guarding; nondistended; no tenderness elsewhere;  +Murphy's; no palpable hepatosplenomegaly MSK: Normal range of motion of extremities; no clubbing/cyanosis Psychiatric: Appropriate affect; alert and oriented x3 Lymphatic: No palpable cervical or axillary lymphadenopathy  Results for orders placed or performed during the hospital encounter of 12/25/20 (from the past 48 hour(s))  CBC with Differential     Status: Abnormal   Collection Time: 12/25/20 12:05 PM  Result Value Ref Range   WBC 7.2 4.0 - 10.5 K/uL   RBC 4.25 3.87 - 5.11 MIL/uL   Hemoglobin 11.1 (L) 12.0 -  15.0 g/dL   HCT 62.2 (L) 29.7 - 98.9 %   MCV 84.2 80.0 - 100.0 fL   MCH 26.1 26.0 - 34.0 pg   MCHC 31.0 30.0 - 36.0 g/dL   RDW 21.1 94.1 - 74.0 %   Platelets 266 150 - 400 K/uL   nRBC 0.0 0.0 - 0.2 %   Neutrophils Relative % 51 %   Neutro Abs 3.6 1.7 - 7.7 K/uL   Lymphocytes Relative 37 %   Lymphs Abs 2.6 0.7 - 4.0 K/uL   Monocytes Relative 7 %   Monocytes Absolute 0.5 0.1 - 1.0 K/uL   Eosinophils Relative 5 %   Eosinophils Absolute 0.4 0.0 - 0.5 K/uL   Basophils Relative 0 %   Basophils Absolute 0.0 0.0 - 0.1 K/uL   Immature Granulocytes 0 %   Abs Immature Granulocytes 0.02 0.00 - 0.07 K/uL    Comment: Performed at [facility], [address], [place], [place] [address]  Comprehensive metabolic panel     Status: Abnormal   Collection Time: 12/25/20 12:05 PM  Result Value Ref Range   Sodium 139 135 - 145 mmol/L   Potassium 3.5 3.5 - 5.1 mmol/L   Chloride 102 98 - 111 mmol/L   CO2 27 22 - 32 mmol/L   Glucose, Bld 106 (H) 70 - 99 mg/dL    Comment: Glucose reference range applies only to samples taken after fasting for at least 8 hours.   BUN 11 6 - 20 mg/dL   Creatinine, Ser 1.85 0.44 - 1.00 mg/dL   Calcium 63.1 8.9 - 49.7 mg/dL   Total Protein 7.5 6.5 - 8.1 g/dL   Albumin 4.3 3.5 - 5.0 g/dL   AST 24 15 - 41 U/L   ALT 26 0 - 44 U/L   Alkaline Phosphatase 104 38 - 126 U/L   Total Bilirubin 0.5 0.3 - 1.2 mg/dL   GFR, Estimated >02 >63 mL/min     Comment: (NOTE) Calculated using the CKD-EPI Creatinine Equation (2021)    Anion gap 10 5 - 15    Comment: Performed at [facility], [address], [place], [place] [address]  Troponin I (High Sensitivity)     Status: None   Collection Time: 12/25/20 12:05 PM  Result Value Ref Range   Troponin I (High Sensitivity) 4 <18 ng/L    Comment: (NOTE) Elevated high sensitivity troponin I (hsTnI) values and significant  changes across serial measurements may suggest ACS but many other  chronic and acute conditions are known to elevate hsTnI results.  Refer to the "Links" section for chest pain algorithms and additional  guidance. Performed at [facility], [address], [place], [place] [address]   Lipase, blood     Status: None   Collection Time: 12/25/20 12:05 PM  Result Value Ref Range   Lipase 45 11 - 51 U/L    Comment: Performed at [facility], [address], [place], [place] [address]  Troponin I (High Sensitivity)     Status: None   Collection Time: 12/25/20  2:25 PM  Result Value Ref Range   Troponin I (High Sensitivity) 3 <18 ng/L    Comment: (NOTE) Elevated high sensitivity troponin I (hsTnI) values and significant  changes across serial measurements may suggest ACS but many other  chronic and acute conditions are known to elevate hsTnI results.  Refer to the "Links" section for chest pain algorithms and additional  guidance. Performed at  [facility], [address] [place], [place] [id number]     DG Chest Portable 1 View  Result Date: 12/25/2020 CLINICAL DATA:  Right-sided chest pain extending into the shoulder and jaw. EXAM: PORTABLE CHEST 1 VIEW COMPARISON:  04/22/2020 FINDINGS: The cardiomediastinal silhouette is unchanged with normal heart size. The lungs are hypoinflated. No airspace consolidation, edema, sizable pleural effusion, or pneumothorax is identified. No acute osseous abnormality is seen. IMPRESSION: No active disease.  Electronically Signed   By: Sebastian AcheAllen  Grady M.D.   On: 12/25/2020 12:33   CT Angio Chest/Abd/Pel for Dissection W and/or Wo Contrast  Result Date: 12/25/2020 CLINICAL DATA:  Patient complains of right jaw and neck pain that radiates into arm. Chest pain radiates to back. Rule out aortic dissection. EXAM: CT ANGIOGRAPHY CHEST, ABDOMEN AND PELVIS TECHNIQUE: Non-contrast CT of the chest was initially obtained. Multidetector CT imaging through the chest, abdomen and pelvis was performed using the standard protocol during bolus administration of intravenous contrast. Multiplanar reconstructed images and MIPs were obtained and reviewed to evaluate the vascular anatomy. CONTRAST:  100mL OMNIPAQUE IOHEXOL 350 MG/ML SOLN COMPARISON:  None. FINDINGS: CTA CHEST FINDINGS Cardiovascular: Preferential opacification of the thoracic aorta. No evidence of thoracic aortic aneurysm or dissection. Normal heart size. No pericardial effusion. Aortic atherosclerosis identified. Calcification noted within the LAD coronary artery. The main pulmonary artery appears patent. No central obstructing embolus. Mediastinum/Nodes: Thyroidectomy. The trachea appears patent and is midline. Normal appearance of the esophagus. No enlarged lymph nodes. Lungs/Pleura: No pleural effusion. No airspace consolidation, atelectasis or pneumothorax. No signs of interstitial edema. Musculoskeletal: No chest wall abnormality. No acute or significant osseous findings. Review of the MIP images confirms the above findings. CTA ABDOMEN AND PELVIS FINDINGS VASCULAR Aorta: Normal caliber aorta without aneurysm, dissection, vasculitis or significant stenosis. Mild aortic atherosclerotic calcifications noted. Celiac: Patent without evidence of aneurysm, dissection, vasculitis or significant stenosis. SMA: Patent without evidence of aneurysm, dissection, vasculitis or significant stenosis. Renals: Both renal arteries are patent without evidence of aneurysm, dissection,  vasculitis, fibromuscular dysplasia or significant stenosis. IMA: Patent without evidence of aneurysm, dissection, vasculitis or significant stenosis. Inflow: Patent without evidence of aneurysm, dissection, vasculitis or significant stenosis. Veins: No obvious venous abnormality within the limitations of this arterial phase study. Review of the MIP images confirms the above findings. NON-VASCULAR Hepatobiliary: Diffuse gallbladder wall thickening is identified with mucosal enhancement. Wall thickness measures up to 8 mm, image 150/10. No CT visible stones identified. No bile duct dilatation. Pancreas: Unremarkable. No pancreatic ductal dilatation or surrounding inflammatory changes. Spleen: Normal in size without focal abnormality. Adrenals/Urinary Tract: Adrenal glands are unremarkable. Kidneys are normal, without renal calculi, focal lesion, or hydronephrosis. Bladder is unremarkable. Stomach/Bowel: Stomach is within normal limits. Appendix appears normal. No evidence of bowel wall thickening, distention, or inflammatory changes. Lymphatic: No adenopathy. Reproductive: Status post hysterectomy. No adnexal masses. Other: No free fluid or fluid collections. Musculoskeletal: Degenerative disc disease noted within the lumbar spine. This is most advanced at L4-5 and L5-S1. No acute or suspicious osseous findings. Review of the MIP images confirms the above findings. IMPRESSION: 1. No signs of aortic dissection. No acute cardiopulmonary abnormality noted. 2. Gallbladder wall thickening and mucosal enhancement. This is nonspecific and may reflect incomplete distention. If there are clinical signs or symptoms of acute cholecystitis consider further evaluation with gallbladder sonogram. 3. Aortic Atherosclerosis (ICD10-I70.0). Lad coronary artery calcification. Electronically Signed   By: Signa Kellaylor  Stroud M.D.   On: 12/25/2020 15:38   [place]  Abdomen Limited RUQ (LIVER/GB)  Result Date: 12/25/2020 CLINICAL DATA:  Possible  inflammatory changes of the gallbladder on recent CT. EXAM: ULTRASOUND ABDOMEN LIMITED RIGHT UPPER QUADRANT COMPARISON:  CT scan from earlier in the same day. FINDINGS: Gallbladder: Gallbladder is partially distended with wall thickening to 7 mm. Calculi are seen. No pericholecystic fluid is noted. Common bile duct: Diameter: 5 mm. Liver: Diffuse increased echogenicity is noted likely related to fatty infiltration. An area suggestive of focal fatty sparing is noted adjacent to the gallbladder fossa. Portal vein is patent on color Doppler imaging with normal direction of blood flow towards the liver. Other: None. IMPRESSION: Cholelithiasis with gallbladder wall thickening. These changes are suspicious for acute cholecystitis. HIDA scan would be confirmatory if clinically indicated. Fatty liver with area of focal fatty sparing. Electronically Signed   By: Alcide Clever M.D.   On: 12/25/2020 16:45     A/P: Doris Davis is an 50 y.o. female with hx HTN, allergies, postop n/v with RUQ pain - found to have findings on RUQ [place] consistent with acute cholecystitis; equivocal but suspicious CT findings as well  -Admit to ward -LFTs normal -Clear liquids tonight, NPO after midnight -IV Abx -Possible OR tomorrow as scheduling allows with our groups on call daytime surgeon Dr. Sheliah Hatch - we discussed laparoscopic cholecystectomy with her; she would like to re-hash things tomorrow after the dilaudid wears with Dr. Sheliah Hatch as well  Marin Olp, MD [facility] Use [email or web address] to contact on call provider

## 2020-12-25 NOTE — ED Triage Notes (Signed)
Sudden onset CP, urgent care gave her a 324mg  ASA, and EMS gave her two NTG, states pain level is now a 3

## 2020-12-25 NOTE — ED Notes (Signed)
Patient transported to Ultrasound with [place] tech

## 2020-12-25 NOTE — ED Notes (Signed)
Attempted to call report to receiving RN states she is in room performing pt care and will return call at first opportunity. Direct number left with RN.

## 2020-12-25 NOTE — ED Provider Notes (Signed)
Patient care assumed at 1530. Patient here for evaluation of chest pain, right upper quadrant pain. CT pending.  CTA is concerning for possible gallbladder pathology, will order right upper quadrant ultrasound.  [place] concerning for cholecystitis.  D/w pt findings of studies.  Abx started.  General surgery consulted.     Tilden Fossa, MD 12/25/20 [phone]

## 2020-12-25 NOTE — ED Triage Notes (Signed)
Pt presents with chest pain, nausea, right side jaw pain and sob that started this am around 0300. States was out running errands when pain got much worse. States having right arm pain that starts at next and radiates into arm.

## 2020-12-25 NOTE — ED Triage Notes (Signed)
EMS called for pt transport 

## 2020-12-26 ENCOUNTER — Inpatient Hospital Stay (HOSPITAL_COMMUNITY): Payer: BC Managed Care – PPO | Admitting: Certified Registered Nurse Anesthetist

## 2020-12-26 ENCOUNTER — Encounter (HOSPITAL_COMMUNITY): Payer: Self-pay

## 2020-12-26 ENCOUNTER — Encounter (HOSPITAL_COMMUNITY)
Admission: EM | Disposition: A | Discharge status: Home / Self Care | Payer: Self-pay | Source: Home / Self Care | Attending: Emergency Medicine

## 2020-12-26 DIAGNOSIS — K810 Acute cholecystitis: Secondary | ICD-10-CM | POA: Diagnosis not present

## 2020-12-26 DIAGNOSIS — K8010 Calculus of gallbladder with chronic cholecystitis without obstruction: Secondary | ICD-10-CM | POA: Diagnosis not present

## 2020-12-26 DIAGNOSIS — D638 Anemia in other chronic diseases classified elsewhere: Secondary | ICD-10-CM | POA: Diagnosis not present

## 2020-12-26 DIAGNOSIS — I10 Essential (primary) hypertension: Secondary | ICD-10-CM | POA: Diagnosis not present

## 2020-12-26 DIAGNOSIS — E039 Hypothyroidism, unspecified: Secondary | ICD-10-CM | POA: Diagnosis not present

## 2020-12-26 HISTORY — PX: CHOLECYSTECTOMY: SHX55

## 2020-12-26 LAB — COMPREHENSIVE METABOLIC PANEL
ALT: 25 U/L (ref 0–44)
AST: 20 U/L (ref 15–41)
Albumin: 3.9 g/dL (ref 3.5–5.0)
Alkaline Phosphatase: 91 U/L (ref 38–126)
Anion gap: 6 (ref 5–15)
BUN: 11 mg/dL (ref 6–20)
CO2: 28 mmol/L (ref 22–32)
Calcium: 9.7 mg/dL (ref 8.9–10.3)
Chloride: 103 mmol/L (ref 98–111)
Creatinine, Ser: 0.72 mg/dL (ref 0.44–1.00)
GFR, Estimated: >60 mL/min (ref >60)
Glucose, Bld: 107 mg/dL — ABNORMAL HIGH (ref 70–99)
Potassium: 4 mmol/L (ref 3.5–5.1)
Sodium: 137 mmol/L (ref 135–145)
Total Bilirubin: 0.6 mg/dL (ref 0.3–1.2)
Total Protein: 6.5 g/dL (ref 6.5–8.1)

## 2020-12-26 LAB — HIV ANTIBODY (ROUTINE TESTING W REFLEX): HIV Screen 4th Generation wRfx: NONREACTIVE

## 2020-12-26 LAB — SURGICAL PCR SCREEN
MRSA, PCR: NEGATIVE
Staphylococcus aureus: NEGATIVE

## 2020-12-26 SURGERY — LAPAROSCOPIC CHOLECYSTECTOMY WITH INTRAOPERATIVE CHOLANGIOGRAM
Anesthesia: General | Site: Abdomen

## 2020-12-26 MED ORDER — FENTANYL CITRATE (PF) 100 MCG/2ML IJ SOLN
25.0000 ug | INTRAMUSCULAR | Status: DC | PRN
  Administered 2020-12-26: 50 ug via INTRAVENOUS

## 2020-12-26 MED ORDER — DEXMEDETOMIDINE (PRECEDEX) IN NS 20 MCG/5ML (4 MCG/ML) IV SYRINGE
PREFILLED_SYRINGE | INTRAVENOUS | Status: DC | PRN
  Administered 2020-12-26: 8 ug via INTRAVENOUS

## 2020-12-26 MED ORDER — ONDANSETRON HCL 4 MG/2ML IJ SOLN
INTRAMUSCULAR | Status: AC
  Filled 2020-12-26: qty 2

## 2020-12-26 MED ORDER — IBUPROFEN 200 MG PO TABS: 600.0000 mg | ORAL_TABLET | Freq: Four times a day (QID) | ORAL | Status: DC | PRN

## 2020-12-26 MED ORDER — SODIUM CHLORIDE 0.9 % IR SOLN
Status: DC | PRN
  Administered 2020-12-26: 1000 mL

## 2020-12-26 MED ORDER — IBUPROFEN 600 MG PO TABS: 600.0000 mg | ORAL_TABLET | ORAL | Status: DC | PRN

## 2020-12-26 MED ORDER — LIDOCAINE 2% (20 MG/ML) 5 ML SYRINGE
INTRAMUSCULAR | Status: AC
  Filled 2020-12-26: qty 5

## 2020-12-26 MED ORDER — KETAMINE HCL 10 MG/ML IJ SOLN
INTRAMUSCULAR | Status: DC | PRN
  Administered 2020-12-26: 30 mg via INTRAVENOUS

## 2020-12-26 MED ORDER — DEXAMETHASONE SODIUM PHOSPHATE 10 MG/ML IJ SOLN
INTRAMUSCULAR | Status: AC
  Filled 2020-12-26: qty 1

## 2020-12-26 MED ORDER — HYDROCODONE-ACETAMINOPHEN 5-325 MG PO TABS
1.0000 | ORAL_TABLET | ORAL | Status: DC | PRN
  Administered 2020-12-27: 1 via ORAL
  Administered 2020-12-27: 2 via ORAL
  Filled 2020-12-26: qty 1
  Filled 2020-12-26: qty 2

## 2020-12-26 MED ORDER — ONDANSETRON HCL 4 MG PO TABS: 4.0000 mg | ORAL_TABLET | Freq: Four times a day (QID) | ORAL | Status: DC | PRN

## 2020-12-26 MED ORDER — CHLORHEXIDINE GLUCONATE 0.12 % MT SOLN
OROMUCOSAL | Status: AC
  Administered 2020-12-26: 15 mL via OROMUCOSAL
  Filled 2020-12-26: qty 15

## 2020-12-26 MED ORDER — BUPIVACAINE HCL 0.25 % IJ SOLN
INTRAMUSCULAR | Status: DC | PRN
  Administered 2020-12-26: 30 mL

## 2020-12-26 MED ORDER — ROCURONIUM BROMIDE 10 MG/ML (PF) SYRINGE
PREFILLED_SYRINGE | INTRAVENOUS | Status: DC | PRN
  Administered 2020-12-26: 60 mg via INTRAVENOUS

## 2020-12-26 MED ORDER — SUGAMMADEX SODIUM 200 MG/2ML IV SOLN
INTRAVENOUS | Status: DC | PRN
  Administered 2020-12-26: 200 mg via INTRAVENOUS

## 2020-12-26 MED ORDER — PROPOFOL 10 MG/ML IV BOLUS
INTRAVENOUS | Status: DC | PRN
  Administered 2020-12-26: 50 mg via INTRAVENOUS
  Administered 2020-12-26: 170 mg via INTRAVENOUS

## 2020-12-26 MED ORDER — DEXAMETHASONE SODIUM PHOSPHATE 10 MG/ML IJ SOLN
INTRAMUSCULAR | Status: DC | PRN
  Administered 2020-12-26: 10 mg via INTRAVENOUS

## 2020-12-26 MED ORDER — PROPOFOL 500 MG/50ML IV EMUL
INTRAVENOUS | Status: DC | PRN
  Administered 2020-12-26: 150 ug/kg/min via INTRAVENOUS

## 2020-12-26 MED ORDER — MIDAZOLAM HCL 2 MG/2ML IJ SOLN
INTRAMUSCULAR | Status: DC | PRN
  Administered 2020-12-26: 2 mg via INTRAVENOUS

## 2020-12-26 MED ORDER — IBUPROFEN 600 MG PO TABS
600.0000 mg | ORAL_TABLET | Freq: Four times a day (QID) | ORAL | Status: DC | PRN
  Administered 2020-12-26 – 2020-12-27 (×3): 600 mg via ORAL
  Filled 2020-12-26 (×3): qty 1

## 2020-12-26 MED ORDER — PROMETHAZINE HCL 25 MG/ML IJ SOLN: 6.2500 mg | INTRAMUSCULAR | Status: DC | PRN

## 2020-12-26 MED ORDER — LACTATED RINGERS IV SOLN: INTRAVENOUS | Status: DC

## 2020-12-26 MED ORDER — PROPOFOL 10 MG/ML IV BOLUS
INTRAVENOUS | Status: AC
  Filled 2020-12-26: qty 20

## 2020-12-26 MED ORDER — FENTANYL CITRATE (PF) 250 MCG/5ML IJ SOLN
INTRAMUSCULAR | Status: DC | PRN
  Administered 2020-12-26: 50 ug via INTRAVENOUS
  Administered 2020-12-26: 150 ug via INTRAVENOUS

## 2020-12-26 MED ORDER — ROCURONIUM BROMIDE 10 MG/ML (PF) SYRINGE
PREFILLED_SYRINGE | INTRAVENOUS | Status: AC
  Filled 2020-12-26: qty 10

## 2020-12-26 MED ORDER — LIDOCAINE 2% (20 MG/ML) 5 ML SYRINGE
INTRAMUSCULAR | Status: DC | PRN
  Administered 2020-12-26: 80 mg via INTRAVENOUS

## 2020-12-26 MED ORDER — CHLORHEXIDINE GLUCONATE 0.12 % MT SOLN: 15.0000 mL | Freq: Once | OROMUCOSAL | Status: AC

## 2020-12-26 MED ORDER — PHENYLEPHRINE 40 MCG/ML (10ML) SYRINGE FOR IV PUSH (FOR BLOOD PRESSURE SUPPORT)
PREFILLED_SYRINGE | INTRAVENOUS | Status: DC | PRN
  Administered 2020-12-26: 80 ug via INTRAVENOUS

## 2020-12-26 MED ORDER — FENTANYL CITRATE (PF) 250 MCG/5ML IJ SOLN
INTRAMUSCULAR | Status: AC
  Filled 2020-12-26: qty 5

## 2020-12-26 MED ORDER — BUPIVACAINE HCL (PF) 0.25 % IJ SOLN
INTRAMUSCULAR | Status: AC
  Filled 2020-12-26: qty 30

## 2020-12-26 MED ORDER — SCOPOLAMINE 1 MG/3DAYS TD PT72: 1.0000 | MEDICATED_PATCH | Freq: Once | TRANSDERMAL | Status: DC

## 2020-12-26 MED ORDER — MIDAZOLAM HCL 2 MG/2ML IJ SOLN
INTRAMUSCULAR | Status: AC
  Filled 2020-12-26: qty 2

## 2020-12-26 MED ORDER — PROCHLORPERAZINE EDISYLATE 10 MG/2ML IJ SOLN: 10.0000 mg | Freq: Four times a day (QID) | INTRAMUSCULAR | Status: DC | PRN

## 2020-12-26 MED ORDER — ACETAMINOPHEN 325 MG PO TABS
650.0000 mg | ORAL_TABLET | Freq: Four times a day (QID) | ORAL | Status: DC | PRN
  Administered 2020-12-26 (×2): 650 mg via ORAL
  Filled 2020-12-26 (×3): qty 2

## 2020-12-26 MED ORDER — 0.9 % SODIUM CHLORIDE (POUR BTL) OPTIME
TOPICAL | Status: DC | PRN
  Administered 2020-12-26: 1000 mL

## 2020-12-26 MED ORDER — ORAL CARE MOUTH RINSE: 15.0000 mL | Freq: Once | OROMUCOSAL | Status: AC

## 2020-12-26 MED ORDER — METHOCARBAMOL 500 MG PO TABS: 500.0000 mg | ORAL_TABLET | Freq: Three times a day (TID) | ORAL | Status: DC | PRN

## 2020-12-26 MED ORDER — PROMETHAZINE HCL 25 MG PO TABS
12.5000 mg | ORAL_TABLET | Freq: Four times a day (QID) | ORAL | Status: DC | PRN
  Administered 2020-12-26: 12.5 mg via ORAL
  Filled 2020-12-26: qty 1

## 2020-12-26 MED ORDER — KETAMINE HCL 50 MG/5ML IJ SOSY
PREFILLED_SYRINGE | INTRAMUSCULAR | Status: AC
  Filled 2020-12-26: qty 5

## 2020-12-26 MED ORDER — OXYCODONE HCL 5 MG PO TABS
5.0000 mg | ORAL_TABLET | Freq: Four times a day (QID) | ORAL | Status: DC | PRN
  Filled 2020-12-26: qty 1

## 2020-12-26 MED ORDER — SCOPOLAMINE 1 MG/3DAYS TD PT72
MEDICATED_PATCH | TRANSDERMAL | Status: AC
  Administered 2020-12-26: 1.5 mg via TRANSDERMAL
  Filled 2020-12-26: qty 1

## 2020-12-26 MED ORDER — FENTANYL CITRATE (PF) 100 MCG/2ML IJ SOLN
INTRAMUSCULAR | Status: AC
  Filled 2020-12-26: qty 2

## 2020-12-26 MED ORDER — HYDROCODONE-ACETAMINOPHEN 5-325 MG PO TABS: 1.0000 | ORAL_TABLET | ORAL | 0 refills | Status: DC | PRN

## 2020-12-26 SURGICAL SUPPLY — 44 items
ADH SKN CLS APL DERMABOND .7 (GAUZE/BANDAGES/DRESSINGS) ×1
APL PRP STRL LF DISP 70% ISPRP (MISCELLANEOUS) ×1
APPLIER CLIP ROT 10 11.4 M/L (STAPLE)
APR CLP MED LRG 11.4X10 (STAPLE)
BAG SPEC RTRVL 10 TROC 200 (ENDOMECHANICALS) ×1
BLADE CLIPPER SURG (BLADE) IMPLANT
CANISTER SUCT 3000ML PPV (MISCELLANEOUS) ×3 IMPLANT
CATH CHOLANG 76X19 KUMAR (CATHETERS) ×3 IMPLANT
CHLORAPREP W/TINT 26 (MISCELLANEOUS) ×3 IMPLANT
CLIP APPLIE ROT 10 11.4 M/L (STAPLE) IMPLANT
CLIP VESOLOCK MED LG 6/CT (CLIP) IMPLANT
COVER MAYO STAND STRL (DRAPES) ×3 IMPLANT
COVER SURGICAL LIGHT HANDLE (MISCELLANEOUS) ×3 IMPLANT
COVER WAND RF STERILE (DRAPES) ×3 IMPLANT
DERMABOND ADVANCED (GAUZE/BANDAGES/DRESSINGS) ×2
DERMABOND ADVANCED .7 DNX12 (GAUZE/BANDAGES/DRESSINGS) ×1 IMPLANT
DRAPE C-ARM 42X120 X-RAY (DRAPES) ×3 IMPLANT
ELECT REM PT RETURN 9FT ADLT (ELECTROSURGICAL) ×3
ELECTRODE REM PT RTRN 9FT ADLT (ELECTROSURGICAL) ×1 IMPLANT
GLOVE SURG SS PI 7.0 STRL IVOR (GLOVE) ×3 IMPLANT
GLOVE SURG UNDER POLY LF SZ7 (GLOVE) ×3 IMPLANT
GOWN STRL REUS W/ TWL LRG LVL3 (GOWN DISPOSABLE) ×3 IMPLANT
GOWN STRL REUS W/TWL LRG LVL3 (GOWN DISPOSABLE) ×9
GRASPER SUT TROCAR 14GX15 (MISCELLANEOUS) ×3 IMPLANT
KIT BASIN OR (CUSTOM PROCEDURE TRAY) ×3 IMPLANT
KIT TURNOVER KIT B (KITS) ×3 IMPLANT
NEEDLE 22X1 1/2 (OR ONLY) (NEEDLE) ×3 IMPLANT
NS IRRIG 1000ML POUR BTL (IV SOLUTION) ×3 IMPLANT
PAD ARMBOARD 7.5X6 YLW CONV (MISCELLANEOUS) ×3 IMPLANT
POUCH RETRIEVAL ECOSAC 10 (ENDOMECHANICALS) ×1 IMPLANT
POUCH RETRIEVAL ECOSAC 10MM (ENDOMECHANICALS) ×3
SCISSORS LAP 5X35 DISP (ENDOMECHANICALS) ×3 IMPLANT
SET IRRIG TUBING LAPAROSCOPIC (IRRIGATION / IRRIGATOR) ×3 IMPLANT
SET TUBE SMOKE EVAC HIGH FLOW (TUBING) ×3 IMPLANT
SLEEVE ENDOPATH XCEL 5M (ENDOMECHANICALS) ×6 IMPLANT
SPECIMEN JAR SMALL (MISCELLANEOUS) ×3 IMPLANT
STOPCOCK 4 WAY LG BORE MALE ST (IV SETS) ×3 IMPLANT
SUT MNCRL AB 4-0 PS2 18 (SUTURE) ×3 IMPLANT
TOWEL GREEN STERILE (TOWEL DISPOSABLE) ×3 IMPLANT
TOWEL GREEN STERILE FF (TOWEL DISPOSABLE) ×3 IMPLANT
TRAY LAPAROSCOPIC MC (CUSTOM PROCEDURE TRAY) ×3 IMPLANT
TROCAR XCEL 12X100 BLDLESS (ENDOMECHANICALS) ×3 IMPLANT
TROCAR XCEL NON-BLD 5MMX100MML (ENDOMECHANICALS) ×3 IMPLANT
WATER STERILE IRR 1000ML POUR (IV SOLUTION) ×3 IMPLANT

## 2020-12-26 NOTE — Progress Notes (Signed)
[facility] Progress Note     Subjective: CC-  Continues to have RUQ pain and nausea.  Objective: Vital signs in last 24 hours: Temp:  [97.6 F (36.4 C)-98.2 F (36.8 C)] 97.7 F (36.5 C) (04/19 0521) Pulse Rate:  [68-104] 68 (04/19 0521) Resp:  [11-27] 18 (04/19 0521) BP: (116-162)/(82-102) 125/87 (04/19 0521) SpO2:  [93 %-100 %] 99 % (04/19 0521) Weight:  [86.2 kg] 86.2 kg (04/18 1154)    Intake/Output from previous day: 04/18 0701 - 04/19 0700 In: 988.4 [I.V.:888.4; IV Piggyback:100] Out: -  Intake/Output this shift: No intake/output data recorded.  PE: Gen:  Alert, NAD, pleasant HEENT: EOM's intact, pupils equal and round Card:  RRR Pulm:  CTAB, no W/R/R, rate and effort normal Abd: Soft, ND, TTP RUQ, no peritonitis, +BS, no HSM Ext:  no BUE/BLE edema, calves soft and nontender Psych: A&Ox4  Skin: no rashes noted, warm and dry  Lab Results:  Recent Labs    12/25/20 1205  WBC 7.2  HGB 11.1*  HCT 35.8*  PLT 266   BMET Recent Labs    12/25/20 1205 12/26/20 0251  NA 139 137  K 3.5 4.0  CL 102 103  CO2 27 28  GLUCOSE 106* 107*  BUN 11 11  CREATININE 0.71 0.72  CALCIUM 10.3 9.7   PT/INR No results for input(s): LABPROT, INR in the last 72 hours. CMP     Component Value Date/Time   NA 137 12/26/2020 0251   K 4.0 12/26/2020 0251   CL 103 12/26/2020 0251   CO2 28 12/26/2020 0251   GLUCOSE 107 (H) 12/26/2020 0251   BUN 11 12/26/2020 0251   CREATININE 0.72 12/26/2020 0251   CALCIUM 9.7 12/26/2020 0251   PROT 6.5 12/26/2020 0251   ALBUMIN 3.9 12/26/2020 0251   AST 20 12/26/2020 0251   ALT 25 12/26/2020 0251   ALKPHOS 91 12/26/2020 0251   BILITOT 0.6 12/26/2020 0251   GFRNONAA >60 12/26/2020 0251   GFRAA >60 05/09/2019 1011   Lipase     Component Value Date/Time   LIPASE 45 12/25/2020 1205       Studies/Results: DG Chest Portable 1 View  Result Date: 12/25/2020 CLINICAL DATA:  Right-sided chest pain extending into the  shoulder and jaw. EXAM: PORTABLE CHEST 1 VIEW COMPARISON:  04/22/2020 FINDINGS: The cardiomediastinal silhouette is unchanged with normal heart size. The lungs are hypoinflated. No airspace consolidation, edema, sizable pleural effusion, or pneumothorax is identified. No acute osseous abnormality is seen. IMPRESSION: No active disease. Electronically Signed   By: Sebastian Ache M.D.   On: 12/25/2020 12:33   CT Angio Chest/Abd/Pel for Dissection W and/or Wo Contrast  Result Date: 12/25/2020 CLINICAL DATA:  Patient complains of right jaw and neck pain that radiates into arm. Chest pain radiates to back. Rule out aortic dissection. EXAM: CT ANGIOGRAPHY CHEST, ABDOMEN AND PELVIS TECHNIQUE: Non-contrast CT of the chest was initially obtained. Multidetector CT imaging through the chest, abdomen and pelvis was performed using the standard protocol during bolus administration of intravenous contrast. Multiplanar reconstructed images and MIPs were obtained and reviewed to evaluate the vascular anatomy. CONTRAST:  OMNIPAQUE IOHEXOL 350 MG/ML SOLN COMPARISON:  None. FINDINGS: CTA CHEST FINDINGS Cardiovascular: Preferential opacification of the thoracic aorta. No evidence of thoracic aortic aneurysm or dissection. Normal heart size. No pericardial effusion. Aortic atherosclerosis identified. Calcification noted within the LAD coronary artery. The main pulmonary artery appears patent. No central obstructing embolus. Mediastinum/Nodes: Thyroidectomy. The trachea appears patent and  is midline. Normal appearance of the esophagus. No enlarged lymph nodes. Lungs/Pleura: No pleural effusion. No airspace consolidation, atelectasis or pneumothorax. No signs of interstitial edema. Musculoskeletal: No chest wall abnormality. No acute or significant osseous findings. Review of the MIP images confirms the above findings. CTA ABDOMEN AND PELVIS FINDINGS VASCULAR Aorta: Normal caliber aorta without aneurysm, dissection, vasculitis or  significant stenosis. Mild aortic atherosclerotic calcifications noted. Celiac: Patent without evidence of aneurysm, dissection, vasculitis or significant stenosis. SMA: Patent without evidence of aneurysm, dissection, vasculitis or significant stenosis. Renals: Both renal arteries are patent without evidence of aneurysm, dissection, vasculitis, fibromuscular dysplasia or significant stenosis. IMA: Patent without evidence of aneurysm, dissection, vasculitis or significant stenosis. Inflow: Patent without evidence of aneurysm, dissection, vasculitis or significant stenosis. Veins: No obvious venous abnormality within the limitations of this arterial phase study. Review of the MIP images confirms the above findings. NON-VASCULAR Hepatobiliary: Diffuse gallbladder wall thickening is identified with mucosal enhancement. Wall thickness measures up to 8 mm, image 150/10. No CT visible stones identified. No bile duct dilatation. Pancreas: Unremarkable. No pancreatic ductal dilatation or surrounding inflammatory changes. Spleen: Normal in size without focal abnormality. Adrenals/Urinary Tract: Adrenal glands are unremarkable. Kidneys are normal, without renal calculi, focal lesion, or hydronephrosis. Bladder is unremarkable. Stomach/Bowel: Stomach is within normal limits. Appendix appears normal. No evidence of bowel wall thickening, distention, or inflammatory changes. Lymphatic: No adenopathy. Reproductive: Status post hysterectomy. No adnexal masses. Other: No free fluid or fluid collections. Musculoskeletal: Degenerative disc disease noted within the lumbar spine. This is most advanced at L4-5 and L5-S1. No acute or suspicious osseous findings. Review of the MIP images confirms the above findings. IMPRESSION: 1. No signs of aortic dissection. No acute cardiopulmonary abnormality noted. 2. Gallbladder wall thickening and mucosal enhancement. This is nonspecific and may reflect incomplete distention. If there are clinical  signs or symptoms of acute cholecystitis consider further evaluation with gallbladder sonogram. 3. Aortic Atherosclerosis (ICD10-I70.0). Lad coronary artery calcification. Electronically Signed   By: Signa Kell M.D.   On: 12/25/2020 15:38   US Abdomen Limited RUQ (LIVER/GB)  Result Date: 12/25/2020 CLINICAL DATA:  Possible inflammatory changes of the gallbladder on recent CT. EXAM: ULTRASOUND ABDOMEN LIMITED RIGHT UPPER QUADRANT COMPARISON:  CT scan from earlier in the same day. FINDINGS: Gallbladder: Gallbladder is partially distended with wall thickening to 7 mm. Calculi are seen. No pericholecystic fluid is noted. Common bile duct: Diameter: 5 mm. Liver: Diffuse increased echogenicity is noted likely related to fatty infiltration. An area suggestive of focal fatty sparing is noted adjacent to the gallbladder fossa. Portal vein is patent on color Doppler imaging with normal direction of blood flow towards the liver. Other: None. IMPRESSION: Cholelithiasis with gallbladder wall thickening. These changes are suspicious for acute cholecystitis. HIDA scan would be confirmatory if clinically indicated. Fatty liver with area of focal fatty sparing. Electronically Signed   By: Alcide Clever M.D.   On: 12/25/2020 16:45    Anti-infectives: Anti-infectives (From admission, onward)   Start     Dose/Rate Route Frequency Ordered Stop   12/25/20 2200  valACYclovir (VALTREX) tablet 1,000 mg        1,000 mg Oral 3 times daily 12/25/20 1918     12/25/20 1930  ciprofloxacin (CIPRO) IVPB 400 mg        400 mg 200 mL/hr over 60 Minutes Intravenous Every 12 hours 12/25/20 1918     12/25/20 1715  cefTRIAXone (ROCEPHIN) 2 g in sodium chloride 0.9 % 100 mL IVPB  2 g 200 mL/hr over 30 Minutes Intravenous  Once 12/25/20 1710 12/25/20 1810       Assessment/Plan Hypothyroidism HTN Anxiety Obesity BMI 33.66  Acute cholecystitis - LFTs WNL. OR today for laparoscopic cholecystectomy. Keep NPO. Continue IV  cipro.  ID - cipro 4/18>> FEN - IVF, NPO VTE - sq heparin Foley - none Follow up - TBD   LOS: 1 day    Franne Forts, [facility] 12/26/2020, 8:11 AM Please see Amion for pager number during day hours 7:00am-4:30pm

## 2020-12-26 NOTE — Anesthesia Procedure Notes (Signed)
Procedure Name: Intubation Date/Time: 12/26/2020 11:22 AM Performed by: Lelon Perla, CRNA Pre-anesthesia Checklist: Patient identified, Emergency Drugs available, Suction available and Patient being monitored Patient Re-evaluated:Patient Re-evaluated prior to induction Oxygen Delivery Method: Circle system utilized Preoxygenation: Pre-oxygenation with 100% oxygen Induction Type: IV induction Ventilation: Mask ventilation without difficulty Laryngoscope Size: Miller and 2 Grade View: Grade I Tube type: Oral Tube size: 7.0 mm Number of attempts: 1 Airway Equipment and Method: Stylet and Oral airway Placement Confirmation: ETT inserted through vocal cords under direct vision,  positive ETCO2 and breath sounds checked- equal and bilateral Secured at: 21 cm Tube secured with: Tape Dental Injury: Teeth and Oropharynx as per pre-operative assessment

## 2020-12-26 NOTE — Anesthesia Preprocedure Evaluation (Addendum)
Anesthesia Evaluation  Patient identified by MRN, date of birth, ID band Patient awake    Reviewed: Allergy & Precautions, NPO status , Patient's Chart, lab work & pertinent test results  History of Anesthesia Complications (+) PONV, Family history of anesthesia reaction and history of anesthetic complications  Airway Mallampati: II  TM Distance: >3 FB Neck ROM: Full    Dental  (+) Teeth Intact, Dental Advisory Given   Pulmonary asthma , Patient abstained from smoking., former smoker,    Pulmonary exam normal breath sounds clear to auscultation       Cardiovascular hypertension, Pt. on medications Normal cardiovascular exam Rhythm:Regular Rate:Normal     Neuro/Psych PSYCHIATRIC DISORDERS Anxiety negative neurological ROS     GI/Hepatic negative GI ROS, Acute Cholecystitis   Endo/Other  Hypothyroidism Obesity   Renal/GU negative Renal ROS     Musculoskeletal  (+) Arthritis ,   Abdominal   Peds  Hematology  (+) Blood dyscrasia, anemia ,   Anesthesia Other Findings Day of surgery medications reviewed with the patient.  Reproductive/Obstetrics                            Anesthesia Physical Anesthesia Plan  ASA: II  Anesthesia Plan: General   Post-op Pain Management:    Induction: Intravenous  PONV Risk Score and Plan: 4 or greater and Midazolam, Dexamethasone, Treatment may vary due to age or medical condition, TIVA, Scopolamine patch - Pre-op and Diphenhydramine  Airway Management Planned: Oral ETT  Additional Equipment:   Intra-op Plan:   Post-operative Plan: Extubation in OR  Informed Consent: I have reviewed the patients History and Physical, chart, labs and discussed the procedure including the risks, benefits and alternatives for the proposed anesthesia with the patient or authorized representative who has indicated his/her understanding and acceptance.     Dental  advisory given  Plan Discussed with: CRNA  Anesthesia Plan Comments:        Anesthesia Quick Evaluation

## 2020-12-26 NOTE — Op Note (Signed)
PATIENT:  Doris Davis  50 y.o. female  PRE-OPERATIVE DIAGNOSIS:  Acute Cholecystitis  POST-OPERATIVE DIAGNOSIS:  Acute Cholecystitis  PROCEDURE:  Procedure(s): LAPAROSCOPIC CHOLECYSTECTOMY   SURGEON:  Surgeon(s): Genesys Coggeshall, De Blanch, MD  ASSISTANT: none  ANESTHESIA:   local and general  Indications for procedure: Doris Davis is a 50 y.o. female with symptoms of Abdominal pain and Nausea and vomiting consistent with gallbladder disease, Confirmed by ultrasound.  Description of procedure: The patient was brought into the operative suite, placed supine. Anesthesia was administered with endotracheal tube. Patient was strapped in place and foot board was secured. All pressure points were offloaded by foam padding. The patient was prepped and draped in the usual sterile fashion.  A periumbilical incision was made and optical entry was used to enter the abdomen. 2 5 mm trocars were placed on in the right lateral space on in the right subcostal space. A 48mm trocar was placed in the subxiphoid space. Marcaine was infused to the subxiphoid space and lateral upper right abdomen in the transversus abdominis plane. Next the patient was placed in reverse trendelenberg. The gallbladder appearedacutely inflamed.   The gallbladder was retracted cephalad and lateral. The peritoneum was reflected off the infundibulum working lateral to medial. The cystic duct and cystic artery were identified and further dissection revealed a critical view. The cystic duct and cystic artery were doubly clipped and ligated.   The gallbladder was removed off the liver bed with cautery. The Gallbladder was placed in a specimen bag. The gallbladder fossa was irrigated and hemostasis was applied with cautery. The gallbladder was removed via the 12mm trocar. The fascial defect was closed with interrupted 0 vicryl suture via laparoscopic trans-fascial suture passer. Pneumoperitoneum was removed, all trocar were removed. All  incisions were closed with 4-0 monocryl subcuticular stitch. The patient woke from anesthesia and was brought to PACU in stable condition. All counts were correct  Findings: inflamed gallbladder  Specimen: gallbladder  Blood loss: 10 ml  Local anesthesia: 30 ml Marcaine  Complications: none  PLAN OF CARE: Admit to inpatient   PATIENT DISPOSITION:  PACU - hemodynamically stable.   Feliciana Rossetti, M.D. General, Bariatric, & Minimally Invasive Surgery [facility]

## 2020-12-26 NOTE — Transfer of Care (Signed)
Immediate Anesthesia Transfer of Care Note  Patient: Doris Davis  Procedure(s) Performed: LAPAROSCOPIC CHOLECYSTECTOMY (N/A Abdomen)  Patient Location: PACU  Anesthesia Type:General  Level of Consciousness: awake, alert  and oriented  Airway & Oxygen Therapy: Patient Spontanous Breathing and Patient connected to nasal cannula oxygen  Post-op Assessment: Report given to RN and Post -op Vital signs reviewed and stable  Post vital signs: Reviewed and stable  Last Vitals:  Vitals Value Taken Time  BP 154/103 12/26/20 1220  Temp    Pulse 79 12/26/20 1225  Resp 13 12/26/20 1225  SpO2 100 % 12/26/20 1225  Vitals shown include unvalidated device data.  Last Pain:  Vitals:   12/26/20 0521  TempSrc: Oral  PainSc:       Patients Stated Pain Goal: 0 (12/26/20 0239)  Complications: No complications documented.

## 2020-12-26 NOTE — Anesthesia Postprocedure Evaluation (Signed)
Anesthesia Post Note  Patient: Doris Davis  Procedure(s) Performed: LAPAROSCOPIC CHOLECYSTECTOMY (N/A Abdomen)     Patient location during evaluation: PACU Anesthesia Type: General Level of consciousness: awake and alert Pain management: pain level controlled Vital Signs Assessment: post-procedure vital signs reviewed and stable Respiratory status: spontaneous breathing, nonlabored ventilation, respiratory function stable and patient connected to nasal cannula oxygen Cardiovascular status: blood pressure returned to baseline and stable Postop Assessment: no apparent nausea or vomiting Anesthetic complications: no   No complications documented.  Last Vitals:  Vitals:   12/26/20 1330 12/26/20 1348  BP:  (!) 150/102  Pulse: 72 82  Resp: 12 14  Temp:  36.7 C  SpO2: 98% 94%    Last Pain:  Vitals:   12/26/20 1348  TempSrc: Oral  PainSc: 10-Worst pain ever                 Cecile Hearing

## 2020-12-26 NOTE — Discharge Summary (Addendum)
[facility] Discharge Summary   Patient ID: Doris Davis MRN: [id number] DOB/AGE: 1971-07-26 50 y.o.  Admit date: 12/25/2020 Discharge date: 12/27/2020  Admitting Diagnosis: Acute cholecystitis   Discharge Diagnosis S/P laparoscopic cholecystectomy   Consultants None   Imaging: DG Chest Portable 1 View  Result Date: 12/25/2020 CLINICAL DATA:  Right-sided chest pain extending into the shoulder and jaw. EXAM: PORTABLE CHEST 1 VIEW COMPARISON:  04/22/2020 FINDINGS: The cardiomediastinal silhouette is unchanged with normal heart size. The lungs are hypoinflated. No airspace consolidation, edema, sizable pleural effusion, or pneumothorax is identified. No acute osseous abnormality is seen. IMPRESSION: No active disease. Electronically Signed   By: Sebastian Ache M.D.   On: 12/25/2020 12:33   CT Angio Chest/Abd/Pel for Dissection W and/or Wo Contrast  Result Date: 12/25/2020 CLINICAL DATA:  Patient complains of right jaw and neck pain that radiates into arm. Chest pain radiates to back. Rule out aortic dissection. EXAM: CT ANGIOGRAPHY CHEST, ABDOMEN AND PELVIS TECHNIQUE: Non-contrast CT of the chest was initially obtained. Multidetector CT imaging through the chest, abdomen and pelvis was performed using the standard protocol during bolus administration of intravenous contrast. Multiplanar reconstructed images and MIPs were obtained and reviewed to evaluate the vascular anatomy. CONTRAST:  OMNIPAQUE IOHEXOL 350 MG/ML SOLN COMPARISON:  None. FINDINGS: CTA CHEST FINDINGS Cardiovascular: Preferential opacification of the thoracic aorta. No evidence of thoracic aortic aneurysm or dissection. Normal heart size. No pericardial effusion. Aortic atherosclerosis identified. Calcification noted within the LAD coronary artery. The main pulmonary artery appears patent. No central obstructing embolus. Mediastinum/Nodes: Thyroidectomy. The trachea appears patent and is midline. Normal  appearance of the esophagus. No enlarged lymph nodes. Lungs/Pleura: No pleural effusion. No airspace consolidation, atelectasis or pneumothorax. No signs of interstitial edema. Musculoskeletal: No chest wall abnormality. No acute or significant osseous findings. Review of the MIP images confirms the above findings. CTA ABDOMEN AND PELVIS FINDINGS VASCULAR Aorta: Normal caliber aorta without aneurysm, dissection, vasculitis or significant stenosis. Mild aortic atherosclerotic calcifications noted. Celiac: Patent without evidence of aneurysm, dissection, vasculitis or significant stenosis. SMA: Patent without evidence of aneurysm, dissection, vasculitis or significant stenosis. Renals: Both renal arteries are patent without evidence of aneurysm, dissection, vasculitis, fibromuscular dysplasia or significant stenosis. IMA: Patent without evidence of aneurysm, dissection, vasculitis or significant stenosis. Inflow: Patent without evidence of aneurysm, dissection, vasculitis or significant stenosis. Veins: No obvious venous abnormality within the limitations of this arterial phase study. Review of the MIP images confirms the above findings. NON-VASCULAR Hepatobiliary: Diffuse gallbladder wall thickening is identified with mucosal enhancement. Wall thickness measures up to 8 mm, image 150/10. No CT visible stones identified. No bile duct dilatation. Pancreas: Unremarkable. No pancreatic ductal dilatation or surrounding inflammatory changes. Spleen: Normal in size without focal abnormality. Adrenals/Urinary Tract: Adrenal glands are unremarkable. Kidneys are normal, without renal calculi, focal lesion, or hydronephrosis. Bladder is unremarkable. Stomach/Bowel: Stomach is within normal limits. Appendix appears normal. No evidence of bowel wall thickening, distention, or inflammatory changes. Lymphatic: No adenopathy. Reproductive: Status post hysterectomy. No adnexal masses. Other: No free fluid or fluid collections.  Musculoskeletal: Degenerative disc disease noted within the lumbar spine. This is most advanced at L4-5 and L5-S1. No acute or suspicious osseous findings. Review of the MIP images confirms the above findings. IMPRESSION: 1. No signs of aortic dissection. No acute cardiopulmonary abnormality noted. 2. Gallbladder wall thickening and mucosal enhancement. This is nonspecific and may reflect incomplete distention. If there are clinical signs or symptoms of acute cholecystitis consider further  evaluation with gallbladder sonogram. 3. Aortic Atherosclerosis (ICD10-I70.0). Lad coronary artery calcification. Electronically Signed   By: Signa Kell M.D.   On: 12/25/2020 15:38   US Abdomen Limited RUQ (LIVER/GB)  Result Date: 12/25/2020 CLINICAL DATA:  Possible inflammatory changes of the gallbladder on recent CT. EXAM: ULTRASOUND ABDOMEN LIMITED RIGHT UPPER QUADRANT COMPARISON:  CT scan from earlier in the same day. FINDINGS: Gallbladder: Gallbladder is partially distended with wall thickening to 7 mm. Calculi are seen. No pericholecystic fluid is noted. Common bile duct: Diameter: 5 mm. Liver: Diffuse increased echogenicity is noted likely related to fatty infiltration. An area suggestive of focal fatty sparing is noted adjacent to the gallbladder fossa. Portal vein is patent on color Doppler imaging with normal direction of blood flow towards the liver. Other: None. IMPRESSION: Cholelithiasis with gallbladder wall thickening. These changes are suspicious for acute cholecystitis. HIDA scan would be confirmatory if clinically indicated. Fatty liver with area of focal fatty sparing. Electronically Signed   By: Alcide Clever M.D.   On: 12/25/2020 16:45    Procedures Dr. Franky Macho Kinsinger (12/26/20) - Laparoscopic Cholecystectomy   Hospital Course:  Patient is a 50 year old female who presented to [facility] with abdominal pain.  Workup showed acute cholecystectitis.  Patient was admitted and underwent procedure listed  above.  Tolerated procedure well and was transferred to the floor.  Diet was advanced as tolerated.  On POD1, the patient was voiding well, tolerating diet, ambulating well, pain well controlled, vital signs stable, incisions c/d/i and felt stable for discharge home.  Patient will follow up in our office in 2-3 weeks and knows to call with questions or concerns. She will call to confirm appointment date/time.    I or a member of my team have reviewed this patient in the Controlled Substance Database.   Allergies as of 12/27/2020      Reactions   Bee Venom Anaphylaxis   Celexa [citalopram Hydrobromide] Anaphylaxis   Coconut Oil Anaphylaxis   Penicillins Anaphylaxis   Has patient had a PCN reaction causing immediate rash, facial/tongue/throat swelling, SOB or lightheadedness with hypotension: Yes Has patient had a PCN reaction causing severe rash involving mucus membranes or skin necrosis: No Has patient had a PCN reaction that required hospitalization No Has patient had a PCN reaction occurring within the last 10 years: No If all of the above answers are "NO", then may proceed with Cephalosporin use.      Medication List    TAKE these medications   acetaminophen 325 MG tablet Commonly known as: TYLENOL Take 650 mg by mouth every 6 (six) hours as needed for mild pain, fever or headache.   buPROPion 300 MG 24 hr tablet Commonly known as: WELLBUTRIN XL Take 300 mg by mouth daily.   docusate sodium 100 MG capsule Commonly known as: Colace Take 1 capsule (100 mg total) by mouth 2 (two) times daily as needed for mild constipation.   fluticasone 50 MCG/ACT nasal spray Commonly known as: FLONASE Place 1-2 sprays into both nostrils daily as needed for allergies or rhinitis.   HYDROcodone-acetaminophen 5-325 MG tablet Commonly known as: NORCO/VICODIN Take 1-2 tablets by mouth every 4 (four) hours as needed for moderate pain or severe pain (1 tab moderate, 2 tabs severe).   ibuprofen 200  MG tablet Commonly known as: ADVIL Take 3 tablets (600 mg total) by mouth every 6 (six) hours as needed for moderate pain.   levothyroxine 100 MCG tablet Commonly known as: SYNTHROID Take 100 mcg  by mouth daily.   losartan-hydrochlorothiazide 100-12.5 MG tablet Commonly known as: HYZAAR Take 1 tablet by mouth daily.   multivitamin capsule Take 1 capsule by mouth daily.   polyethylene glycol 17 g packet Commonly known as: MiraLax Take 17 g by mouth daily as needed for mild constipation.   promethazine 12.5 MG tablet Commonly known as: PHENERGAN Take 1 tablet (12.5 mg total) by mouth every 6 (six) hours as needed for nausea or vomiting.         Follow-up Information    [facility], [place]. Go on 01/16/2021.   Specialty: General Surgery Why: Your appointment is 01/16/21 at 8:30 am  Please arrive 30 minutes prior to your appointment to check in and fill out paper work. Bring photo ID and insurance information. Contact information: [address] [place] [address] [phone]              Signed: Franne Forts , [facility] 12/26/2020, 4:10 PM Please see Amion for pager number during day hours 7:00am-4:30pm

## 2020-12-26 NOTE — Discharge Instructions (Signed)
[facility] ° °Please arrive at least 30 min before your appointment to complete your check in paperwork.  If you are unable to arrive 30 min prior to your appointment time we may have to cancel or reschedule you. °LAPAROSCOPIC SURGERY: POST OP INSTRUCTIONS °Always review your discharge instruction sheet given to you by the facility where your surgery was performed. °IF YOU HAVE DISABILITY OR FAMILY LEAVE FORMS, YOU MUST BRING THEM TO THE OFFICE FOR PROCESSING.   °DO NOT GIVE THEM TO YOUR DOCTOR. ° °PAIN CONTROL ° °1. First take acetaminophen (Tylenol) AND/or ibuprofen (Advil) to control your pain after surgery.  Follow directions on package.  Taking acetaminophen (Tylenol) and/or ibuprofen (Advil) regularly after surgery will help to control your pain and lower the amount of prescription pain medication you may need.  You should not take more than 4,000 mg (4 grams) of acetaminophen (Tylenol) in 24 hours.  You should not take ibuprofen (Advil), aleve, motrin, naprosyn or other NSAIDS if you have a history of stomach ulcers or chronic kidney disease.  °2. A prescription for pain medication may be given to you upon discharge.  Take your pain medication as prescribed, if you still have uncontrolled pain after taking acetaminophen (Tylenol) or ibuprofen (Advil). °3. Use ice packs to help control pain. °4. If you need a refill on your pain medication, please contact your pharmacy.  They will contact our office to request authorization. Prescriptions will not be filled after 5pm or on week-ends. ° °HOME MEDICATIONS °5. Take your usually prescribed medications unless otherwise directed. ° °DIET °6. You should follow a light diet the first few days after arrival home.  Be sure to include lots of fluids daily. Avoid fatty, fried foods.  ° °CONSTIPATION °7. It is common to experience some constipation after surgery and if you are taking pain medication.  Increasing fluid intake and taking a stool  softener (such as Colace) will usually help or prevent this problem from occurring.  A mild laxative (Milk of Magnesia or Miralax) should be taken according to package instructions if there are no bowel movements after 48 hours. ° °WOUND/INCISION CARE °8. Most patients will experience some swelling and bruising in the area of the incisions.  Ice packs will help.  Swelling and bruising can take several days to resolve.  °9. Unless discharge instructions indicate otherwise, follow guidelines below  °a. STERI-STRIPS - you may remove your outer bandages 48 hours after surgery, and you may shower at that time.  You have steri-strips (small skin tapes) in place directly over the incision.  These strips should be left on the skin for 7-10 days.   °b. DERMABOND/SKIN GLUE - you may shower in 24 hours.  The glue will flake off over the next 2-3 weeks. °10. Any sutures or staples will be removed at the office during your follow-up visit. ° °ACTIVITIES °11. You may resume regular (light) daily activities beginning the next day--such as daily self-care, walking, climbing stairs--gradually increasing activities as tolerated.  You may have sexual intercourse when it is comfortable.  Refrain from any heavy lifting or straining until approved by your doctor. °a. You may drive when you are no longer taking prescription pain medication, you can comfortably wear a seatbelt, and you can safely maneuver your car and apply brakes. ° °FOLLOW-UP °12. You should see your doctor in the office for a follow-up appointment approximately 2-3 weeks after your surgery.  You should have been given your post-op/follow-up appointment when   your surgery was scheduled.  If you did not receive a post-op/follow-up appointment, make sure that you call for this appointment within a day or two after you arrive home to insure a convenient appointment time. ° °OTHER INSTRUCTIONS ° °WHEN TO CALL YOUR DOCTOR: °1. Fever over 101.0 °2. Inability to  urinate °3. Continued bleeding from incision. °4. Increased pain, redness, or drainage from the incision. °5. Increasing abdominal pain ° °The clinic staff is available to answer your questions during regular business hours.  Please don’t hesitate to call and ask to speak to one of the nurses for clinical concerns.  If you have a medical emergency, go to the nearest emergency room or call 911.  A surgeon from [facility] is always on call at the hospital. °[address], [address], [place], [place]  [address] ? [address] [address], [place], [place]   [address] °[phone] ? [phone] ? FAX [phone] ° ° ° °

## 2020-12-27 ENCOUNTER — Encounter (HOSPITAL_COMMUNITY): Payer: Self-pay | Admitting: General Surgery

## 2020-12-27 LAB — SURGICAL PATHOLOGY

## 2020-12-27 MED ORDER — PROMETHAZINE HCL 12.5 MG PO TABS: 12.5000 mg | ORAL_TABLET | Freq: Four times a day (QID) | ORAL | 0 refills | Status: DC | PRN

## 2020-12-27 MED ORDER — DOCUSATE SODIUM 100 MG PO CAPS: 100.0000 mg | ORAL_CAPSULE | Freq: Two times a day (BID) | ORAL | 2 refills | Status: AC | PRN

## 2020-12-27 MED ORDER — POLYETHYLENE GLYCOL 3350 17 G PO PACK: 17.0000 g | PACK | Freq: Every day | ORAL | 0 refills | Status: DC | PRN

## 2020-12-27 NOTE — Plan of Care (Signed)
  Problem: Education: Goal: Knowledge of General Education information will improve Description: Including pain rating scale, medication(s)/side effects and non-pharmacologic comfort measures Outcome: Completed/Met  Alert and oriented: Discharge instructions/education done patient and husband asked questions and verbalized understanding. Work note given to patient. Pt took her belongings. She requested to walk to the car accompanied by her husband.

## 2020-12-27 NOTE — Progress Notes (Signed)
Progress Note: General Surgery Service   Chief Complaint/Subjective: Vomited omelet this morning, soreness at incisions  Objective: Vital signs in last 24 hours: Temp:  [97.3 F (36.3 C)-98.5 F (36.9 C)] 98 F (36.7 C) (04/20 0902) Pulse Rate:  [66-91] 82 (04/20 0902) Resp:  [11-18] 16 (04/20 0902) BP: (129-166)/(76-110) 129/91 (04/20 0902) SpO2:  [94 %-100 %] 98 % (04/20 0902) Last BM Date: 12/25/20  Intake/Output from previous day: 04/19 0701 - 04/20 0700 In: 1470 [P.O.:120; I.V.:1150; IV Piggyback:200] Out: 5 [Blood:5] Intake/Output this shift: No intake/output data recorded.  Gen: NAD  Resp: nonlabored  Card: RRR  Abd: incisions c/d/i, ecchymosis over right 2 incisions  Lab Results: CBC  Recent Labs    12/25/20 1205  WBC 7.2  HGB 11.1*  HCT 35.8*  PLT 266   BMET Recent Labs    12/25/20 1205 12/26/20 0251  NA 139 137  K 3.5 4.0  CL 102 103  CO2 27 28  GLUCOSE 106* 107*  BUN 11 11  CREATININE 0.71 0.72  CALCIUM 10.3 9.7   PT/INR No results for input(s): LABPROT, INR in the last 72 hours. ABG No results for input(s): PHART, HCO3 in the last 72 hours.  Invalid input(s): PCO2, PO2  Anti-infectives: Anti-infectives (From admission, onward)   Start     Dose/Rate Route Frequency Ordered Stop   12/25/20 2200  valACYclovir (VALTREX) tablet 1,000 mg  Status:  Discontinued        1,000 mg Oral 3 times daily 12/25/20 1918 12/26/20 1341   12/25/20 1930  ciprofloxacin (CIPRO) IVPB 400 mg  Status:  Discontinued        400 mg 200 mL/hr over 60 Minutes Intravenous Every 12 hours 12/25/20 1918 12/26/20 1341   12/25/20 1715  cefTRIAXone (ROCEPHIN) 2 g in sodium chloride 0.9 % 100 mL IVPB        2 g 200 mL/hr over 30 Minutes Intravenous  Once 12/25/20 1710 12/25/20 1810      Medications: Scheduled Meds: . losartan  100 mg Oral Daily   And  . hydrochlorothiazide  12.5 mg Oral Daily   Continuous Infusions: . promethazine (PHENERGAN) injection (IM or  IVPB)     PRN Meds:.acetaminophen, HYDROcodone-acetaminophen, ibuprofen, methocarbamol, nitroGLYCERIN, ondansetron, prochlorperazine, promethazine (PHENERGAN) injection (IM or IVPB), promethazine  Assessment/Plan: s/p Procedure(s): LAPAROSCOPIC CHOLECYSTECTOMY 12/26/2020 -continue bland diet -likely home this afternoon    LOS: 2 days   Rodman Pickle, MD [phone] [facility]

## 2021-02-13 DIAGNOSIS — M545 Low back pain, unspecified: Secondary | ICD-10-CM | POA: Diagnosis not present

## 2021-02-13 DIAGNOSIS — M5416 Radiculopathy, lumbar region: Secondary | ICD-10-CM | POA: Diagnosis not present

## 2021-02-15 ENCOUNTER — Other Ambulatory Visit: Payer: Self-pay | Admitting: Orthopedic Surgery

## 2021-02-15 DIAGNOSIS — M5416 Radiculopathy, lumbar region: Secondary | ICD-10-CM

## 2021-02-21 ENCOUNTER — Other Ambulatory Visit: Payer: Self-pay

## 2021-02-21 ENCOUNTER — Ambulatory Visit
Admission: RE | Admit: 2021-02-21 | Discharge: 2021-02-21 | Disposition: A | Discharge status: Home / Self Care | Payer: BC Managed Care – PPO | Source: Ambulatory Visit | Attending: Orthopedic Surgery | Admitting: Orthopedic Surgery

## 2021-02-21 DIAGNOSIS — M545 Low back pain, unspecified: Secondary | ICD-10-CM | POA: Diagnosis not present

## 2021-02-21 DIAGNOSIS — M48061 Spinal stenosis, lumbar region without neurogenic claudication: Secondary | ICD-10-CM | POA: Diagnosis not present

## 2021-02-21 DIAGNOSIS — M5416 Radiculopathy, lumbar region: Secondary | ICD-10-CM

## 2021-02-26 DIAGNOSIS — M545 Low back pain, unspecified: Secondary | ICD-10-CM | POA: Diagnosis not present

## 2021-03-05 DIAGNOSIS — M47816 Spondylosis without myelopathy or radiculopathy, lumbar region: Secondary | ICD-10-CM | POA: Diagnosis not present

## 2021-03-14 DIAGNOSIS — M47816 Spondylosis without myelopathy or radiculopathy, lumbar region: Secondary | ICD-10-CM | POA: Diagnosis not present

## 2021-04-10 DIAGNOSIS — M47816 Spondylosis without myelopathy or radiculopathy, lumbar region: Secondary | ICD-10-CM | POA: Diagnosis not present

## 2021-05-08 ENCOUNTER — Institutional Professional Consult (permissible substitution): Payer: BC Managed Care – PPO | Admitting: Plastic Surgery

## 2021-05-23 ENCOUNTER — Other Ambulatory Visit: Payer: Self-pay

## 2021-05-23 ENCOUNTER — Ambulatory Visit (INDEPENDENT_AMBULATORY_CARE_PROVIDER_SITE_OTHER): Payer: BC Managed Care – PPO

## 2021-05-23 ENCOUNTER — Ambulatory Visit
Admission: EM | Admit: 2021-05-23 | Discharge: 2021-05-23 | Disposition: A | Discharge status: Home / Self Care | Payer: BC Managed Care – PPO | Attending: Internal Medicine | Admitting: Internal Medicine

## 2021-05-23 DIAGNOSIS — S63502A Unspecified sprain of left wrist, initial encounter: Secondary | ICD-10-CM | POA: Diagnosis not present

## 2021-05-23 DIAGNOSIS — R053 Chronic cough: Secondary | ICD-10-CM | POA: Diagnosis not present

## 2021-05-23 DIAGNOSIS — W19XXXA Unspecified fall, initial encounter: Secondary | ICD-10-CM

## 2021-05-23 DIAGNOSIS — R059 Cough, unspecified: Secondary | ICD-10-CM

## 2021-05-23 DIAGNOSIS — Z043 Encounter for examination and observation following other accident: Secondary | ICD-10-CM | POA: Diagnosis not present

## 2021-05-23 DIAGNOSIS — M25532 Pain in left wrist: Secondary | ICD-10-CM

## 2021-05-23 DIAGNOSIS — W06XXXA Fall from bed, initial encounter: Secondary | ICD-10-CM | POA: Diagnosis not present

## 2021-05-23 DIAGNOSIS — M79642 Pain in left hand: Secondary | ICD-10-CM | POA: Diagnosis not present

## 2021-05-23 DIAGNOSIS — U099 Post covid-19 condition, unspecified: Secondary | ICD-10-CM

## 2021-05-23 MED ORDER — PREDNISONE 20 MG PO TABS: 40.0000 mg | ORAL_TABLET | Freq: Every day | ORAL | 0 refills | Status: AC

## 2021-05-23 MED ORDER — ALBUTEROL SULFATE HFA 108 (90 BASE) MCG/ACT IN AERS: 1.0000 | INHALATION_SPRAY | Freq: Four times a day (QID) | RESPIRATORY_TRACT | 0 refills | Status: DC | PRN

## 2021-05-23 NOTE — ED Triage Notes (Signed)
Pt states had COVID the of 03/12/2021 and has been coughing ever since. States over the past month the cough has worsen at night til she vomits and has to sleep sitting up.  Pt states fell out of bed this morning and hit the nightstand and rotated wrist between bed and night stand.

## 2021-05-23 NOTE — Discharge Instructions (Addendum)
You have been prescribed prednisone steroid and albuterol inhaler to help with persistent cough.  Please follow-up with provided contact information for pulmonology if cough persists.  Your x-rays were all negative.  Suspect that you have a left wrist sprain.  A wrist brace has been applied.  Please do not sleep in this wrist brace.  You may apply ice application as well.  Avoid taking meloxicam while on prednisone.

## 2021-05-23 NOTE — ED Provider Notes (Signed)
[facility]    CSN: [id number] Arrival date & time: 05/23/21  0909      History   Chief Complaint Chief Complaint  Patient presents with   Cough   Wrist Pain    HPI Doris Davis is a 50 y.o. female.   Patient presents with persistent cough that has been present since COVID-19 diagnosis on the week of 03/12/2021.  All symptoms have resolved except for a productive cough with clear sputum.  Cough is worse at night.  Denies any chest pain or shortness of breath.  Denies any chronic lung diseases.  Patient also states that she fell out of bed this morning and landed on her right wrist.  Right wrist landed between the nightstand and the bed.  Thinks that her wrist was flexed when it landed.  Having pain in the hand as well as the right wrist.  Denies any numbness or tingling.   Cough Wrist Pain   Past Medical History:  Diagnosis Date   Allergy    Anemia    Anxiety    Arthritis    Asthma    with contact with cats   BV (bacterial vaginosis) 08/09/2013   Family history of adverse reaction to anesthesia    mother was sick for days   Hypertension    PONV (postoperative nausea and vomiting)    sts with ablation she was given meds for nausea and "got sicker".   Vaginal bleeding 08/09/2013   Had supra cervical hyst 2012, has had bleeding after sex   Venereal disease 1990   chlamydia   Wears glasses     Patient Active Problem List   Diagnosis Date Noted   Acute cholecystitis 12/25/2020   Multinodular goiter (nontoxic) 09/19/2016   Multiple thyroid nodules 09/11/2016   Obese 03/18/2016   Smoker 03/18/2016   Thyroid nodule 03/18/2016   Perimenopausal 03/18/2016   Hypertension 08/09/2013   Vaginal bleeding 08/09/2013   BV (bacterial vaginosis) 08/09/2013   Stress fracture of foot with nonunion 06/02/2013   Infection of right foot 04/01/2013   Right foot pain 04/01/2013    Past Surgical History:  Procedure Laterality Date   ABDOMINAL HYSTERECTOMY      BREAST CYST EXCISION Left    35 yrs ago- benign   BREAST SURGERY     lump removed from left breast back in the 1980's   BUNIONECTOMY  11/14   rt   CESAREAN SECTION  1995   CHOLECYSTECTOMY N/A 12/26/2020   Procedure: LAPAROSCOPIC CHOLECYSTECTOMY;  Surgeon: Rodman Pickle, MD;  Location: [facility];  Service: General;  Laterality: N/A;   ENDOMETRIAL ABLATION  2010   [facility]   FOOT ARTHROPLASTY  11/15   rt-revision bunionectomy-   FOOT SURGERY     FRACTURE SURGERY  2005   left ankle-[place]   HAMMER TOE SURGERY Right 12/01/2014   Procedure: REVISION OF RIGHT 3RD HAMMER TOE CORRECTION;  Surgeon: Toni Arthurs, MD;  Location: [facility];  Service: Orthopedics;  Laterality: Right;   JOINT REPLACEMENT     big toe joint removed   LAPAROSCOPIC SUPRACERVICAL HYSTERECTOMY  07/17/2011   Procedure: LAPAROSCOPIC SUPRACERVICAL HYSTERECTOMY;  Surgeon: Lazaro Arms, MD;  Location: [facility];  Service: Gynecology;  Laterality: N/A;   REMOVAL OF IMPLANT Right 12/01/2014   Procedure: REMOVAL OF  DEEP IMPLANT FROM RIGHT 3RD TOE;  Surgeon: Toni Arthurs, MD;  Location:  SURGERY CENTER;  Service: Orthopedics;  Laterality: Right;   THYROIDECTOMY N/A 09/19/2016  Procedure: TOTAL THYROIDECTOMY;  Surgeon: Darnell Level, MD;  Location: [facility] OR;  Service: General;  Laterality: N/A;    OB History     Gravida  5   Para  2   Term  1   Preterm  1   AB  3   Living  1      SAB  3   IAB      Ectopic      Multiple      Live Births  2            Home Medications    Prior to Admission medications   Medication Sig Start Date End Date Taking? Authorizing Provider  albuterol (VENTOLIN HFA) 108 (90 Base) MCG/ACT inhaler Inhale 1-2 puffs into the lungs every 6 (six) hours as needed for wheezing or shortness of breath (cough). 05/23/21  Yes Lance Muss, FNP  predniSONE (DELTASONE) 20 MG tablet Take 2 tablets (40 mg total) by mouth daily for 5 days. 05/23/21 05/28/21 Yes Lance Muss, FNP  acetaminophen (TYLENOL) 325 MG tablet Take 650 mg by mouth every 6 (six) hours as needed for mild pain, fever or headache.    [provider]  buPROPion (WELLBUTRIN XL) 300 MG 24 hr tablet Take 300 mg by mouth daily. 03/01/16   [provider]  docusate sodium (COLACE) 100 MG capsule Take 1 capsule (100 mg total) by mouth 2 (two) times daily as needed for mild constipation. 12/27/20 12/27/21  Meuth, Brooke A, PA-C  fluticasone (FLONASE) 50 MCG/ACT nasal spray Place 1-2 sprays into both nostrils daily as needed for allergies or rhinitis.    [provider]  ibuprofen (ADVIL) 200 MG tablet Take 3 tablets (600 mg total) by mouth every 6 (six) hours as needed for moderate pain. 12/26/20   Juliet Rude, PA-C  levothyroxine (SYNTHROID) 100 MCG tablet Take 100 mcg by mouth daily. 11/03/20   [provider]  losartan-hydrochlorothiazide (HYZAAR) 100-12.5 MG tablet Take 1 tablet by mouth daily.  03/21/17   [provider]  Multiple Vitamin (MULTIVITAMIN) capsule Take 1 capsule by mouth daily.    [provider]  polyethylene glycol (MIRALAX) 17 g packet Take 17 g by mouth daily as needed for mild constipation. 12/27/20   Meuth, Lina Sar, PA-C  promethazine (PHENERGAN) 12.5 MG tablet Take 1 tablet (12.5 mg total) by mouth every 6 (six) hours as needed for nausea or vomiting. 12/27/20   Meuth, Lina Sar, PA-C    Family History Family History  Problem Relation Age of Onset   Coronary artery disease Mother    Hypertension Mother    Stroke Mother    Diabetes Paternal Grandmother    Diabetes Paternal Uncle    Anesthesia problems Neg Hx    Malignant hyperthermia Neg Hx    Hypotension Neg Hx    Pseudochol deficiency Neg Hx     Social History Social History   Tobacco Use   Smoking status: Every Day    Packs/day: 0.25    Years: 20.00    Pack years: 5.00    Types: Cigarettes    Last attempt to quit: 12/25/2013    Years since quitting: 7.4    Smokeless tobacco: Never  Vaping Use   Vaping Use: Never used  Substance Use Topics   Alcohol use: Yes    Comment: occ   Drug use: No     Allergies   Bee venom, Celexa [citalopram hydrobromide], Coconut oil, and Penicillins  Review of Systems Review of Systems Per HPI  Physical Exam Triage Vital Signs ED Triage Vitals  Enc Vitals Group     BP 05/23/21 1059 (!) 161/105     Pulse Rate 05/23/21 1059 93     Resp 05/23/21 1059 18     Temp 05/23/21 1059 98.5 F (36.9 C)     Temp Source 05/23/21 1059 Oral     SpO2 05/23/21 1059 99 %     Weight --      Height --      Head Circumference --      Peak Flow --      Pain Score 05/23/21 1101 6     Pain Loc --      Pain Edu? --      Excl. in GC? --    No data found.  Updated Vital Signs BP (!) 161/105 (BP Location: Left Arm)   Pulse 93   Temp 98.5 F (36.9 C) (Oral)   Resp 18   LMP 07/10/2011   SpO2 99%   Visual Acuity Right Eye Distance:   Left Eye Distance:   Bilateral Distance:    Right Eye Near:   Left Eye Near:    Bilateral Near:     Physical Exam Constitutional:      Appearance: Normal appearance.  HENT:     Head: Normocephalic and atraumatic.     Right Ear: Tympanic membrane and ear canal normal.     Left Ear: Tympanic membrane and ear canal normal.     Nose: Nose normal.     Mouth/Throat:     Mouth: Mucous membranes are moist.     Pharynx: No posterior oropharyngeal erythema.  Eyes:     Extraocular Movements: Extraocular movements intact.     Conjunctiva/sclera: Conjunctivae normal.  Cardiovascular:     Rate and Rhythm: Normal rate and regular rhythm.     Pulses: Normal pulses.     Heart sounds: Normal heart sounds.  Pulmonary:     Effort: Pulmonary effort is normal. No respiratory distress.     Breath sounds: Normal breath sounds. No wheezing or rhonchi.  Musculoskeletal:     Right wrist: Normal.     Left wrist: Swelling, tenderness and bony tenderness present. No deformity, effusion,  lacerations, snuff box tenderness or crepitus. Normal range of motion. Normal pulse.     Right hand: Normal.     Left hand: Swelling and tenderness present. No deformity, lacerations or bony tenderness. Normal range of motion. Normal strength. Normal sensation. Normal capillary refill. Normal pulse.     Comments: Tenderness to palpation generalized to left wrist and to dorsal surface of left hand.  Neurovascular intact.  No lacerations or abrasions noted.  Skin:    General: Skin is warm and dry.  Neurological:     General: No focal deficit present.     Mental Status: She is alert and oriented to person, place, and time. Mental status is at baseline.  Psychiatric:        Mood and Affect: Mood normal.        Behavior: Behavior normal.        Thought Content: Thought content normal.        Judgment: Judgment normal.     UC Treatments / Results  Labs (all labs ordered are listed, but only abnormal results are displayed) Labs Reviewed - No data to display  EKG   Radiology DG Chest 2 View  Result Date: 05/23/2021 CLINICAL DATA:  Fall EXAM: CHEST - 2 VIEW COMPARISON:  Multiple priors FINDINGS: The heart size and mediastinal contours are within normal limits. Both lungs are clear. No pleural effusion. No acute osseous abnormality. IMPRESSION: No active cardiopulmonary disease. Similar appearance to prior studies. Electronically Signed   By: Guadlupe Spanish M.D.   On: 05/23/2021 11:52   DG Wrist Complete Left  Result Date: 05/23/2021 CLINICAL DATA:  Left wrist pain after fall out of bed today. EXAM: LEFT WRIST - COMPLETE 3+ VIEW COMPARISON:  None. FINDINGS: There is no evidence of fracture or dislocation. There is no evidence of arthropathy or other focal bone abnormality. Soft tissues are unremarkable. IMPRESSION: Negative. Electronically Signed   By: Lupita Raider M.D.   On: 05/23/2021 11:52   DG Hand Complete Left  Result Date: 05/23/2021 CLINICAL DATA:  Acute left hand pain after fall  out of bed today. EXAM: LEFT HAND - COMPLETE 3+ VIEW COMPARISON:  None. FINDINGS: There is no evidence of fracture or dislocation. There is no evidence of arthropathy or other focal bone abnormality. Soft tissues are unremarkable. IMPRESSION: Negative. Electronically Signed   By: Lupita Raider M.D.   On: 05/23/2021 11:53    Procedures Procedures (including critical care time)  Medications Ordered in UC Medications - No data to display  Initial Impression / Assessment and Plan / UC Course  I have reviewed the triage vital signs and the nursing notes.  Pertinent labs & imaging results that were available during my care of the patient were reviewed by me and considered in my medical decision making (see chart for details).     Chest x-ray was negative for any acute cardiopulmonary process.  Suspect post COVID 19 cough that is present.  Will treat with prednisone steroid.  Also prescribing albuterol inhaler as patient states that this is proved successful with cough in the past.  Patient provided with contact information for pulmonologist if cough persists with this current treatment plan.  Patient is nontoxic-appearing and does not appear to be in need of immediate medical attention at the hospital at this time.  X-rays of hand and wrist were negative for fracture or any acute bony abnormality.  Suspect left wrist sprain.  Wrist brace was applied to left wrist in urgent care today.  Advised patient not to sleep in wrist brace.  Patient to apply ice and take Tylenol as needed.  Advised patient to avoid any NSAIDs while taking prednisone.  Patient to follow-up if pain persists. Discussed strict return precautions. Patient verbalized understanding and is agreeable with plan.  Final Clinical Impressions(s) / UC Diagnoses   Final diagnoses:  Post-COVID-19 syndrome manifesting as chronic cough  Persistent cough for 3 weeks or longer  Left wrist pain  Fall from bed, initial encounter  Sprain of left  wrist, initial encounter     Discharge Instructions      You have been prescribed prednisone steroid and albuterol inhaler to help with persistent cough.  Please follow-up with provided contact information for pulmonology if cough persists.  Your x-rays were all negative.  Suspect that you have a left wrist sprain.  A wrist brace has been applied.  Please do not sleep in this wrist brace.  You may apply ice application as well.  Avoid taking meloxicam while on prednisone.     ED Prescriptions     Medication Sig Dispense Auth. Provider   predniSONE (DELTASONE) 20 MG tablet Take 2 tablets (40 mg total) by mouth daily for 5  days. 10 tablet Lance Muss, FNP   albuterol (VENTOLIN HFA) 108 (90 Base) MCG/ACT inhaler Inhale 1-2 puffs into the lungs every 6 (six) hours as needed for wheezing or shortness of breath (cough). 1 each Lance Muss, FNP      PDMP not reviewed this encounter.   Lance Muss, FNP 05/23/21 1227

## 2021-06-12 DIAGNOSIS — Z23 Encounter for immunization: Secondary | ICD-10-CM | POA: Diagnosis not present

## 2021-06-13 DIAGNOSIS — M545 Low back pain, unspecified: Secondary | ICD-10-CM | POA: Diagnosis not present

## 2021-06-20 DIAGNOSIS — M545 Low back pain, unspecified: Secondary | ICD-10-CM | POA: Diagnosis not present

## 2021-07-06 DIAGNOSIS — B029 Zoster without complications: Secondary | ICD-10-CM | POA: Diagnosis not present

## 2021-07-06 DIAGNOSIS — E039 Hypothyroidism, unspecified: Secondary | ICD-10-CM | POA: Diagnosis not present

## 2021-07-06 DIAGNOSIS — I10 Essential (primary) hypertension: Secondary | ICD-10-CM | POA: Diagnosis not present

## 2021-07-06 DIAGNOSIS — F411 Generalized anxiety disorder: Secondary | ICD-10-CM | POA: Diagnosis not present

## 2021-07-10 DIAGNOSIS — M545 Low back pain, unspecified: Secondary | ICD-10-CM | POA: Diagnosis not present

## 2021-08-10 DIAGNOSIS — M542 Cervicalgia: Secondary | ICD-10-CM | POA: Diagnosis not present

## 2021-08-15 ENCOUNTER — Other Ambulatory Visit: Payer: Self-pay | Admitting: Family Medicine

## 2021-08-15 DIAGNOSIS — M542 Cervicalgia: Secondary | ICD-10-CM

## 2021-08-17 ENCOUNTER — Ambulatory Visit
Admission: RE | Admit: 2021-08-17 | Discharge: 2021-08-17 | Disposition: A | Discharge status: Home / Self Care | Payer: BC Managed Care – PPO | Source: Ambulatory Visit | Attending: Family Medicine | Admitting: Family Medicine

## 2021-08-17 DIAGNOSIS — R590 Localized enlarged lymph nodes: Secondary | ICD-10-CM | POA: Diagnosis not present

## 2021-08-17 DIAGNOSIS — M542 Cervicalgia: Secondary | ICD-10-CM

## 2021-08-28 DIAGNOSIS — M542 Cervicalgia: Secondary | ICD-10-CM | POA: Diagnosis not present

## 2021-08-28 DIAGNOSIS — R0681 Apnea, not elsewhere classified: Secondary | ICD-10-CM | POA: Diagnosis not present

## 2021-08-28 DIAGNOSIS — F458 Other somatoform disorders: Secondary | ICD-10-CM | POA: Diagnosis not present

## 2021-09-05 DIAGNOSIS — G4733 Obstructive sleep apnea (adult) (pediatric): Secondary | ICD-10-CM | POA: Diagnosis not present

## 2021-09-20 DIAGNOSIS — G4733 Obstructive sleep apnea (adult) (pediatric): Secondary | ICD-10-CM | POA: Diagnosis not present

## 2021-09-26 DIAGNOSIS — R1319 Other dysphagia: Secondary | ICD-10-CM | POA: Diagnosis not present

## 2021-10-21 DIAGNOSIS — G4733 Obstructive sleep apnea (adult) (pediatric): Secondary | ICD-10-CM | POA: Diagnosis not present

## 2021-11-18 DIAGNOSIS — G4733 Obstructive sleep apnea (adult) (pediatric): Secondary | ICD-10-CM | POA: Diagnosis not present

## 2021-12-10 DIAGNOSIS — G4733 Obstructive sleep apnea (adult) (pediatric): Secondary | ICD-10-CM | POA: Diagnosis not present

## 2021-12-20 DIAGNOSIS — G4733 Obstructive sleep apnea (adult) (pediatric): Secondary | ICD-10-CM | POA: Diagnosis not present

## 2021-12-21 DIAGNOSIS — G4733 Obstructive sleep apnea (adult) (pediatric): Secondary | ICD-10-CM | POA: Diagnosis not present

## 2022-01-02 DIAGNOSIS — I10 Essential (primary) hypertension: Secondary | ICD-10-CM | POA: Diagnosis not present

## 2022-01-02 DIAGNOSIS — F900 Attention-deficit hyperactivity disorder, predominantly inattentive type: Secondary | ICD-10-CM | POA: Diagnosis not present

## 2022-01-02 DIAGNOSIS — F411 Generalized anxiety disorder: Secondary | ICD-10-CM | POA: Diagnosis not present

## 2022-02-06 DIAGNOSIS — G4733 Obstructive sleep apnea (adult) (pediatric): Secondary | ICD-10-CM | POA: Diagnosis not present

## 2022-03-01 DIAGNOSIS — F411 Generalized anxiety disorder: Secondary | ICD-10-CM | POA: Diagnosis not present

## 2022-03-01 DIAGNOSIS — F909 Attention-deficit hyperactivity disorder, unspecified type: Secondary | ICD-10-CM | POA: Diagnosis not present

## 2022-03-21 DIAGNOSIS — G4733 Obstructive sleep apnea (adult) (pediatric): Secondary | ICD-10-CM | POA: Diagnosis not present

## 2022-03-25 DIAGNOSIS — G4733 Obstructive sleep apnea (adult) (pediatric): Secondary | ICD-10-CM | POA: Diagnosis not present

## 2022-04-04 DIAGNOSIS — G4733 Obstructive sleep apnea (adult) (pediatric): Secondary | ICD-10-CM | POA: Diagnosis not present

## 2022-04-22 DIAGNOSIS — M79662 Pain in left lower leg: Secondary | ICD-10-CM | POA: Diagnosis not present

## 2022-04-25 DIAGNOSIS — G4733 Obstructive sleep apnea (adult) (pediatric): Secondary | ICD-10-CM | POA: Diagnosis not present

## 2022-05-02 DIAGNOSIS — M79662 Pain in left lower leg: Secondary | ICD-10-CM | POA: Diagnosis not present

## 2022-05-06 DIAGNOSIS — M79662 Pain in left lower leg: Secondary | ICD-10-CM | POA: Diagnosis not present

## 2022-05-28 DIAGNOSIS — G629 Polyneuropathy, unspecified: Secondary | ICD-10-CM | POA: Diagnosis not present

## 2022-06-07 DIAGNOSIS — E785 Hyperlipidemia, unspecified: Secondary | ICD-10-CM | POA: Diagnosis not present

## 2022-06-07 DIAGNOSIS — I10 Essential (primary) hypertension: Secondary | ICD-10-CM | POA: Diagnosis not present

## 2022-06-07 DIAGNOSIS — Z0000 Encounter for general adult medical examination without abnormal findings: Secondary | ICD-10-CM | POA: Diagnosis not present

## 2022-06-07 DIAGNOSIS — Z23 Encounter for immunization: Secondary | ICD-10-CM | POA: Diagnosis not present

## 2022-06-07 DIAGNOSIS — E039 Hypothyroidism, unspecified: Secondary | ICD-10-CM | POA: Diagnosis not present

## 2022-06-07 DIAGNOSIS — M79662 Pain in left lower leg: Secondary | ICD-10-CM | POA: Diagnosis not present

## 2022-06-13 DIAGNOSIS — Z23 Encounter for immunization: Secondary | ICD-10-CM | POA: Diagnosis not present

## 2022-06-21 DIAGNOSIS — G4733 Obstructive sleep apnea (adult) (pediatric): Secondary | ICD-10-CM | POA: Diagnosis not present

## 2022-06-24 DIAGNOSIS — M79662 Pain in left lower leg: Secondary | ICD-10-CM | POA: Diagnosis not present

## 2022-06-24 DIAGNOSIS — M6281 Muscle weakness (generalized): Secondary | ICD-10-CM | POA: Diagnosis not present

## 2022-06-24 DIAGNOSIS — M545 Low back pain, unspecified: Secondary | ICD-10-CM | POA: Diagnosis not present

## 2022-07-01 DIAGNOSIS — M6281 Muscle weakness (generalized): Secondary | ICD-10-CM | POA: Diagnosis not present

## 2022-07-01 DIAGNOSIS — M545 Low back pain, unspecified: Secondary | ICD-10-CM | POA: Diagnosis not present

## 2022-07-01 DIAGNOSIS — M79662 Pain in left lower leg: Secondary | ICD-10-CM | POA: Diagnosis not present

## 2022-07-03 DIAGNOSIS — M79662 Pain in left lower leg: Secondary | ICD-10-CM | POA: Diagnosis not present

## 2022-07-03 DIAGNOSIS — M545 Low back pain, unspecified: Secondary | ICD-10-CM | POA: Diagnosis not present

## 2022-07-03 DIAGNOSIS — M6281 Muscle weakness (generalized): Secondary | ICD-10-CM | POA: Diagnosis not present

## 2022-07-03 DIAGNOSIS — G4733 Obstructive sleep apnea (adult) (pediatric): Secondary | ICD-10-CM | POA: Diagnosis not present

## 2022-07-05 DIAGNOSIS — M545 Low back pain, unspecified: Secondary | ICD-10-CM | POA: Diagnosis not present

## 2022-07-05 DIAGNOSIS — M6281 Muscle weakness (generalized): Secondary | ICD-10-CM | POA: Diagnosis not present

## 2022-07-05 DIAGNOSIS — M79662 Pain in left lower leg: Secondary | ICD-10-CM | POA: Diagnosis not present

## 2022-07-08 DIAGNOSIS — M79662 Pain in left lower leg: Secondary | ICD-10-CM | POA: Diagnosis not present

## 2022-07-08 DIAGNOSIS — M6281 Muscle weakness (generalized): Secondary | ICD-10-CM | POA: Diagnosis not present

## 2022-07-08 DIAGNOSIS — M545 Low back pain, unspecified: Secondary | ICD-10-CM | POA: Diagnosis not present

## 2022-07-15 DIAGNOSIS — M79662 Pain in left lower leg: Secondary | ICD-10-CM | POA: Diagnosis not present

## 2022-07-15 DIAGNOSIS — M6281 Muscle weakness (generalized): Secondary | ICD-10-CM | POA: Diagnosis not present

## 2022-07-15 DIAGNOSIS — M545 Low back pain, unspecified: Secondary | ICD-10-CM | POA: Diagnosis not present

## 2022-07-19 DIAGNOSIS — M6281 Muscle weakness (generalized): Secondary | ICD-10-CM | POA: Diagnosis not present

## 2022-07-19 DIAGNOSIS — M545 Low back pain, unspecified: Secondary | ICD-10-CM | POA: Diagnosis not present

## 2022-07-19 DIAGNOSIS — M79662 Pain in left lower leg: Secondary | ICD-10-CM | POA: Diagnosis not present

## 2022-07-22 DIAGNOSIS — M79662 Pain in left lower leg: Secondary | ICD-10-CM | POA: Diagnosis not present

## 2022-07-22 DIAGNOSIS — M6281 Muscle weakness (generalized): Secondary | ICD-10-CM | POA: Diagnosis not present

## 2022-07-22 DIAGNOSIS — M545 Low back pain, unspecified: Secondary | ICD-10-CM | POA: Diagnosis not present

## 2022-07-24 DIAGNOSIS — M6281 Muscle weakness (generalized): Secondary | ICD-10-CM | POA: Diagnosis not present

## 2022-07-24 DIAGNOSIS — M79662 Pain in left lower leg: Secondary | ICD-10-CM | POA: Diagnosis not present

## 2022-07-24 DIAGNOSIS — M545 Low back pain, unspecified: Secondary | ICD-10-CM | POA: Diagnosis not present

## 2022-07-26 DIAGNOSIS — M6281 Muscle weakness (generalized): Secondary | ICD-10-CM | POA: Diagnosis not present

## 2022-07-26 DIAGNOSIS — M79662 Pain in left lower leg: Secondary | ICD-10-CM | POA: Diagnosis not present

## 2022-07-26 DIAGNOSIS — M545 Low back pain, unspecified: Secondary | ICD-10-CM | POA: Diagnosis not present

## 2022-07-29 DIAGNOSIS — M545 Low back pain, unspecified: Secondary | ICD-10-CM | POA: Diagnosis not present

## 2022-07-29 DIAGNOSIS — M79662 Pain in left lower leg: Secondary | ICD-10-CM | POA: Diagnosis not present

## 2022-07-29 DIAGNOSIS — M6281 Muscle weakness (generalized): Secondary | ICD-10-CM | POA: Diagnosis not present

## 2022-07-31 DIAGNOSIS — M79662 Pain in left lower leg: Secondary | ICD-10-CM | POA: Diagnosis not present

## 2022-08-09 DIAGNOSIS — M79662 Pain in left lower leg: Secondary | ICD-10-CM | POA: Diagnosis not present

## 2022-08-16 DIAGNOSIS — M79662 Pain in left lower leg: Secondary | ICD-10-CM | POA: Diagnosis not present

## 2022-08-27 DIAGNOSIS — G4733 Obstructive sleep apnea (adult) (pediatric): Secondary | ICD-10-CM | POA: Diagnosis not present

## 2022-09-16 DIAGNOSIS — M79671 Pain in right foot: Secondary | ICD-10-CM | POA: Diagnosis not present

## 2022-11-01 IMAGING — MR MR LUMBAR SPINE W/O CM
4 of 5 series · 26 of 48 positions shown · non-contrast
Comparison: 10/28/2009

CLINICAL DATA: Low back pain radiating to both legs

EXAM:
MRI LUMBAR SPINE WITHOUT CONTRAST
TECHNIQUE: Multiplanar, multisequence MR imaging of the lumbar spine was
performed. No intravenous contrast was administered.

[Series 3: T2 · sagittal · 4.0mm · 0.53mm/px · 8 of 17 slices shown (1 of 2)]
[im 1/17]
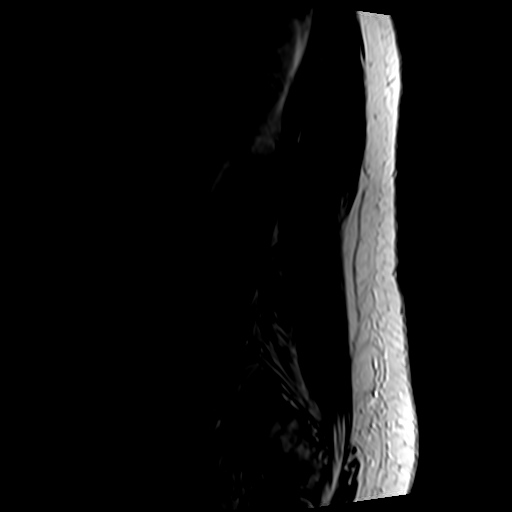
[im 3/17]
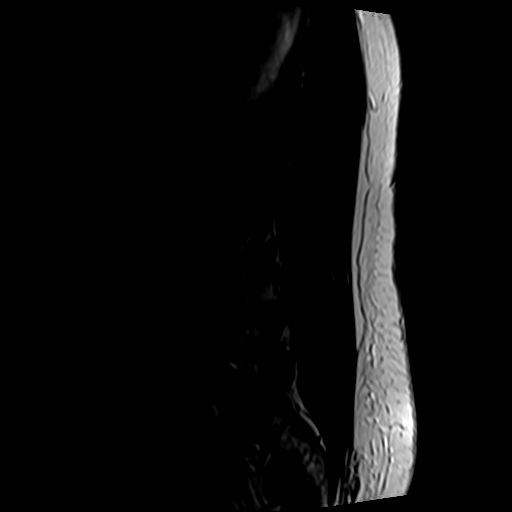
[im 5/17]
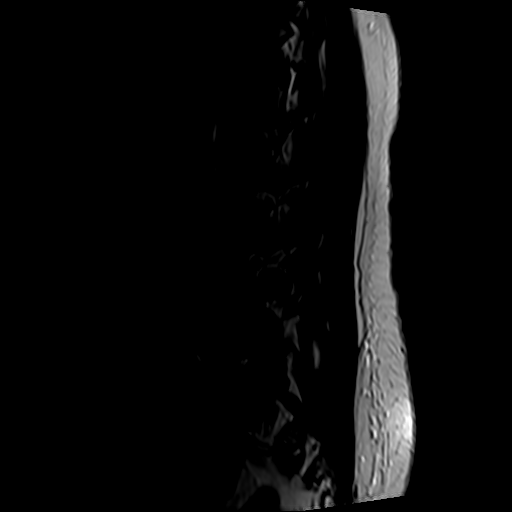
[im 7/17]
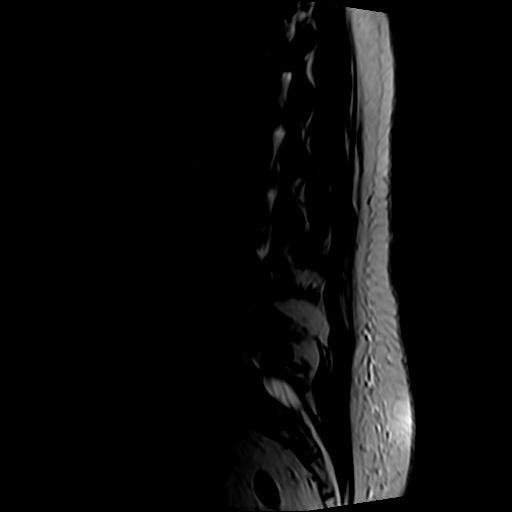
[im 10/17]
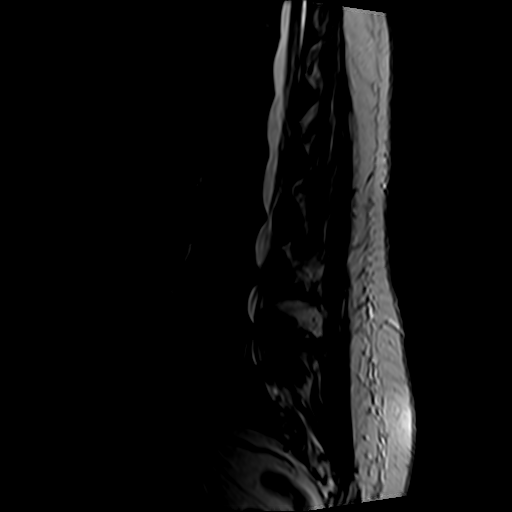
[im 12/17]
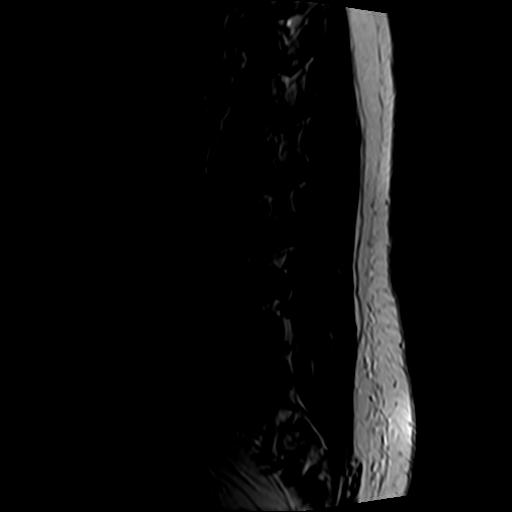
[im 14/17]
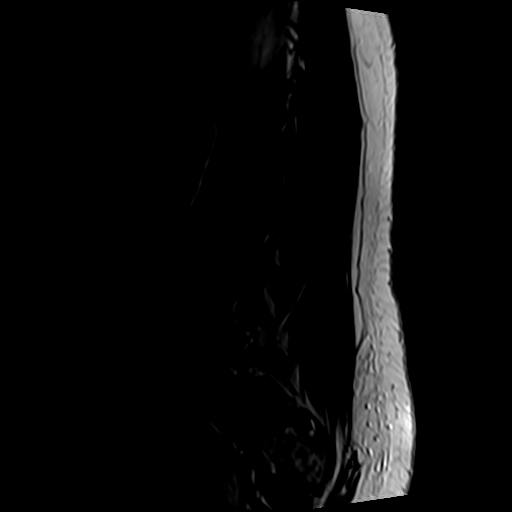
[im 17/17]
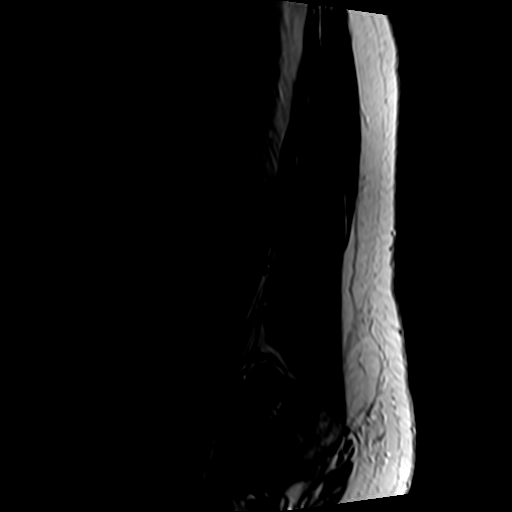

[Series 5: T1 · sagittal · 4.0mm · 0.53mm/px · 6 of 17 slices shown (1 of 2)]
[im 1/17]
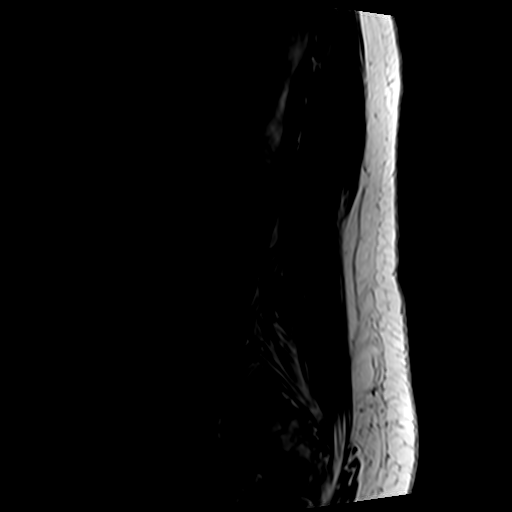
[im 3/17]
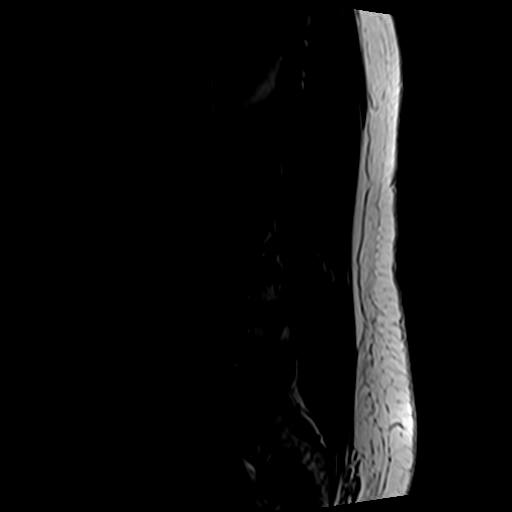
[im 6/17]
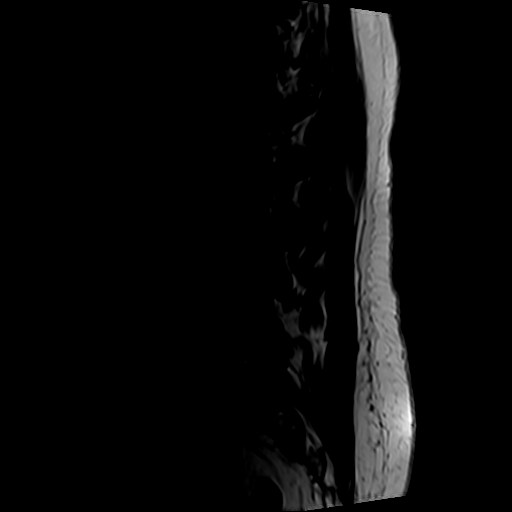
[im 9/17]
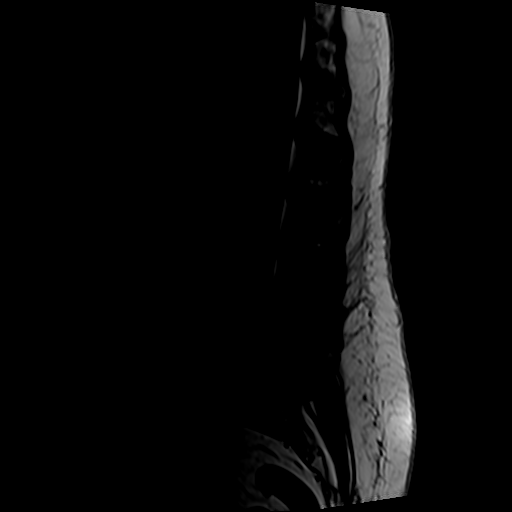
[im 11/17]
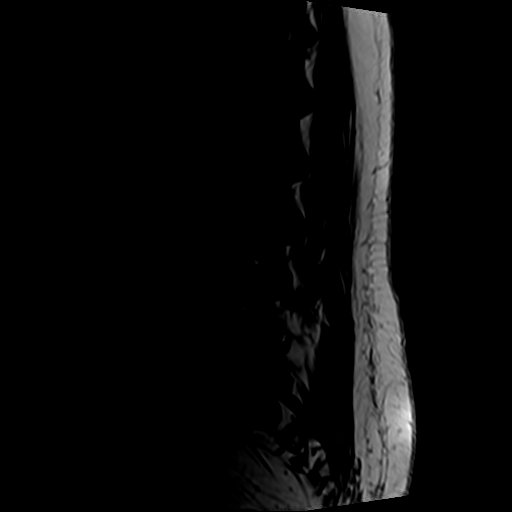
[im 14/17]
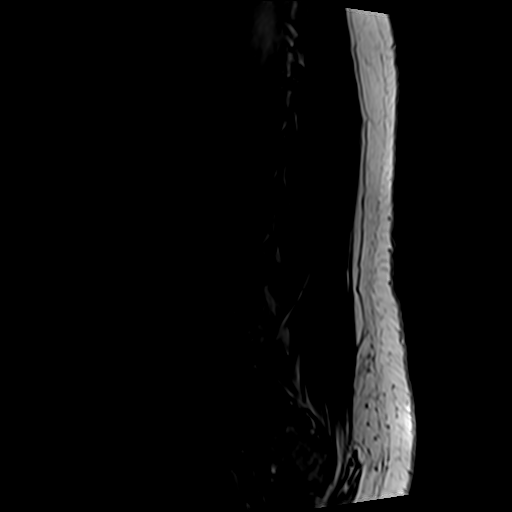

[Series 6: T2 · axial · 4.0mm · 0.70mm/px · z∈[+20,+221]mm · 9 of 32 slices shown (2 of 2)]
[im 1/32]
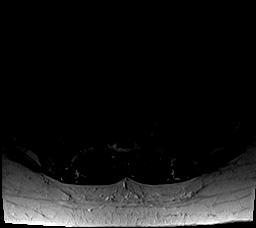
[im 6/32]
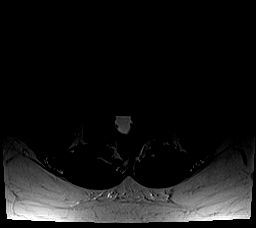
[im 11/32]
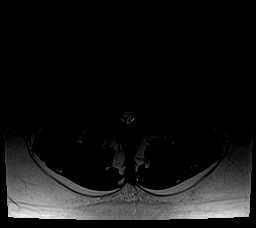
[im 13/32]
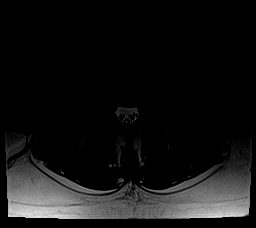
[im 16/32]
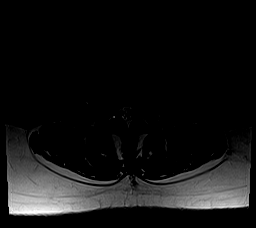
[im 19/32]
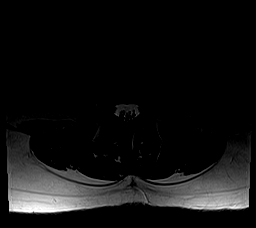
[im 21/32]
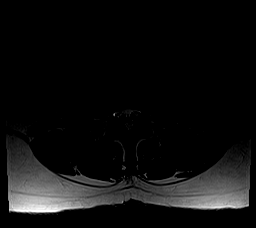
[im 26/32]
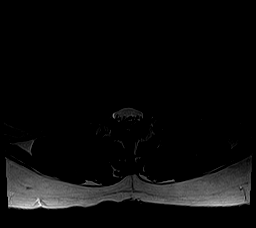
[im 32/32]
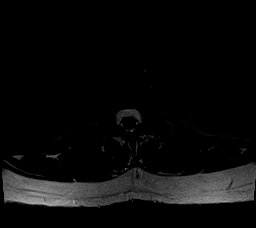

[Series 7: T1 · axial · 4.0mm · 0.35mm/px · z∈[+46,+178]mm · 3 of 32 slices shown (2 of 2)]
[im 6/32]
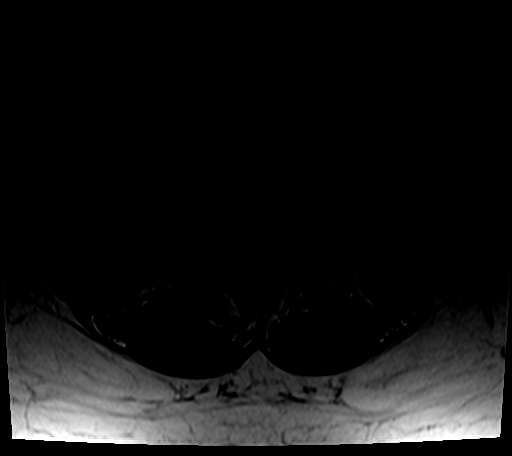
[im 16/32]
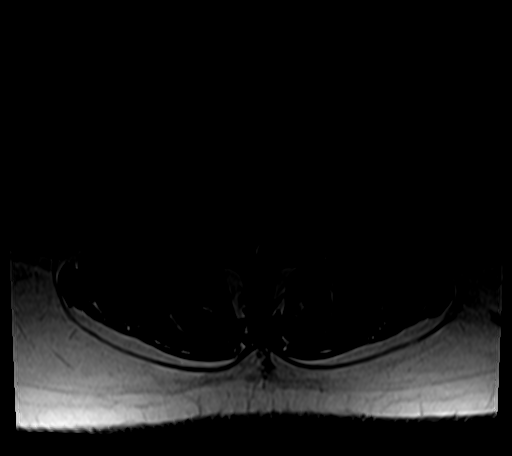
[im 26/32]
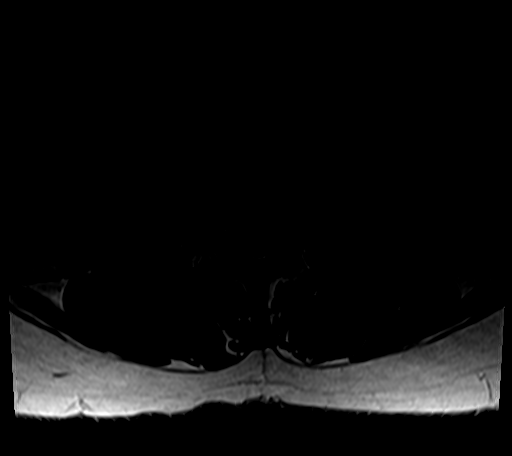

[26 of 48 positions shown; findings below may reference images not displayed]

FINDINGS: Segmentation:  Standard.

Alignment:  Grade 1 retrolisthesis at L3-4 and L4-5

Vertebrae:  No fracture, evidence of discitis, or bone lesion.

Conus medullaris and cauda equina: Conus extends to the L1 level.
Conus and cauda equina appear normal.

Paraspinal and other soft tissues: Negative

Disc levels:

L1-L2: Normal disc space and facet joints. No spinal canal stenosis.
No neural foraminal stenosis.

L2-L3: New small disc bulge. No spinal canal stenosis. No neural
foraminal stenosis.

L3-L4: Slightly worsened left asymmetric disc bulge. Narrowing of
both lateral recesses without central spinal canal stenosis. Mild
left neural foraminal stenosis.

L4-L5: Small disc bulge with mild facet hypertrophy. Narrowing of
both lateral recesses without central spinal canal stenosis.
Progression of severe right and mild left neural foraminal stenosis.

L5-S1: Small left asymmetric disc bulge. No spinal canal stenosis.
Unchanged mild right and worsened moderate left neural foraminal
stenosis.

Visualized sacrum: Normal.
IMPRESSION: 1. Worsening of moderate left L5-S1 neural foraminal stenosis.
2. L4-5 severe right and mild left neural foraminal stenosis,
worsened.
3. L3-4 bilateral lateral recess narrowing and mild left neural
foraminal stenosis, slightly worsened.

## 2022-12-05 DIAGNOSIS — G4733 Obstructive sleep apnea (adult) (pediatric): Secondary | ICD-10-CM | POA: Diagnosis not present

## 2022-12-05 DIAGNOSIS — E039 Hypothyroidism, unspecified: Secondary | ICD-10-CM | POA: Diagnosis not present

## 2022-12-05 DIAGNOSIS — F411 Generalized anxiety disorder: Secondary | ICD-10-CM | POA: Diagnosis not present

## 2022-12-05 DIAGNOSIS — Z13220 Encounter for screening for lipoid disorders: Secondary | ICD-10-CM | POA: Diagnosis not present

## 2022-12-05 DIAGNOSIS — I10 Essential (primary) hypertension: Secondary | ICD-10-CM | POA: Diagnosis not present

## 2022-12-23 ENCOUNTER — Other Ambulatory Visit: Payer: Self-pay | Admitting: Family Medicine

## 2022-12-23 DIAGNOSIS — Z1231 Encounter for screening mammogram for malignant neoplasm of breast: Secondary | ICD-10-CM

## 2023-01-02 DIAGNOSIS — G4733 Obstructive sleep apnea (adult) (pediatric): Secondary | ICD-10-CM | POA: Diagnosis not present

## 2023-01-13 DIAGNOSIS — M180 Bilateral primary osteoarthritis of first carpometacarpal joints: Secondary | ICD-10-CM | POA: Diagnosis not present

## 2023-01-13 DIAGNOSIS — M65331 Trigger finger, right middle finger: Secondary | ICD-10-CM | POA: Diagnosis not present

## 2023-01-13 DIAGNOSIS — M79642 Pain in left hand: Secondary | ICD-10-CM | POA: Diagnosis not present

## 2023-01-13 DIAGNOSIS — M79641 Pain in right hand: Secondary | ICD-10-CM | POA: Diagnosis not present

## 2023-02-06 ENCOUNTER — Ambulatory Visit
Admission: RE | Admit: 2023-02-06 | Discharge: 2023-02-06 | Disposition: A | Discharge status: Home / Self Care | Payer: BC Managed Care – PPO | Source: Ambulatory Visit | Attending: Family Medicine | Admitting: Family Medicine

## 2023-02-06 DIAGNOSIS — Z1231 Encounter for screening mammogram for malignant neoplasm of breast: Secondary | ICD-10-CM

## 2023-04-02 ENCOUNTER — Ambulatory Visit (INDEPENDENT_AMBULATORY_CARE_PROVIDER_SITE_OTHER): Payer: BC Managed Care – PPO

## 2023-04-02 ENCOUNTER — Encounter: Payer: Self-pay | Admitting: Podiatry

## 2023-04-02 ENCOUNTER — Ambulatory Visit (INDEPENDENT_AMBULATORY_CARE_PROVIDER_SITE_OTHER): Payer: BC Managed Care – PPO | Admitting: Podiatry

## 2023-04-02 VITALS — BP 140/100 | HR 95 | Ht 63.0 in

## 2023-04-02 DIAGNOSIS — M19071 Primary osteoarthritis, right ankle and foot: Secondary | ICD-10-CM | POA: Diagnosis not present

## 2023-04-02 DIAGNOSIS — M7672 Peroneal tendinitis, left leg: Secondary | ICD-10-CM

## 2023-04-02 DIAGNOSIS — M25372 Other instability, left ankle: Secondary | ICD-10-CM | POA: Diagnosis not present

## 2023-04-02 DIAGNOSIS — M79671 Pain in right foot: Secondary | ICD-10-CM

## 2023-04-02 DIAGNOSIS — M25572 Pain in left ankle and joints of left foot: Secondary | ICD-10-CM

## 2023-04-02 NOTE — Progress Notes (Signed)
No chief complaint on file.   HPI: 52 y.o. female presenting today as a new patient for evaluation of bilateral foot and ankle pain.  Patient has a history of mechanical fall which led to multiple surgeries in 2004.  Patient states that over the past year she has been treated and managed at [facility].  She has ankle instability with ankle pain to the left lower extremity.  She does have history of multiple surgeries to the Achilles tendon.  She is concerned for possible tendon compromise.  She has tried multiple conservative modalities at [facility] including physical therapy, cortisone injections, oral anti-inflammatories, immobilization in cam boot but there is been no improvement.  Presenting for second opinion  Past Medical History:  Diagnosis Date   Allergy    Anemia    Anxiety    Arthritis    Asthma    with contact with cats   BV (bacterial vaginosis) 08/09/2013   Family history of adverse reaction to anesthesia    mother was sick for days   Hypertension    PONV (postoperative nausea and vomiting)    sts with ablation she was given meds for nausea and "got sicker".   Vaginal bleeding 08/09/2013   Had supra cervical hyst 2012, has had bleeding after sex   Venereal disease 1990   chlamydia   Wears glasses     Past Surgical History:  Procedure Laterality Date   ABDOMINAL HYSTERECTOMY     BREAST CYST EXCISION Left    35 yrs ago- benign   BREAST SURGERY     lump removed from left breast back in the 1980's   BUNIONECTOMY  11/14   rt   CESAREAN SECTION  1995   CHOLECYSTECTOMY N/A 12/26/2020   Procedure: LAPAROSCOPIC CHOLECYSTECTOMY;  Surgeon: Rodman Pickle, MD;  Location: [facility];  Service: General;  Laterality: N/A;   ENDOMETRIAL ABLATION  2010   [facility]   FOOT ARTHROPLASTY  11/15   rt-revision bunionectomy-   FOOT SURGERY     FRACTURE SURGERY  2005   left ankle-[place]   HAMMER TOE SURGERY Right 12/01/2014   Procedure: REVISION OF RIGHT 3RD HAMMER TOE  CORRECTION;  Surgeon: Toni Arthurs, MD;  Location: [facility];  Service: Orthopedics;  Laterality: Right;   JOINT REPLACEMENT     big toe joint removed   LAPAROSCOPIC SUPRACERVICAL HYSTERECTOMY  07/17/2011   Procedure: LAPAROSCOPIC SUPRACERVICAL HYSTERECTOMY;  Surgeon: Lazaro Arms, MD;  Location: [facility];  Service: Gynecology;  Laterality: N/A;   REMOVAL OF IMPLANT Right 12/01/2014   Procedure: REMOVAL OF  DEEP IMPLANT FROM RIGHT 3RD TOE;  Surgeon: Toni Arthurs, MD;  Location: [facility];  Service: Orthopedics;  Laterality: Right;   THYROIDECTOMY N/A 09/19/2016   Procedure: TOTAL THYROIDECTOMY;  Surgeon: Darnell Level, MD;  Location: [facility];  Service: General;  Laterality: N/A;    Allergies  Allergen Reactions   Bee Venom Anaphylaxis   Celexa [Citalopram Hydrobromide] Anaphylaxis   Coconut (Cocos Nucifera) Anaphylaxis   Penicillins Anaphylaxis    Has patient had a PCN reaction causing immediate rash, facial/tongue/throat swelling, SOB or lightheadedness with hypotension: Yes Has patient had a PCN reaction causing severe rash involving mucus membranes or skin necrosis: No Has patient had a PCN reaction that required hospitalization No Has patient had a PCN reaction occurring within the last 10 years: No If all of the above answers are "NO", then may proceed with Cephalosporin use.      Physical Exam: General:  The patient is alert and oriented x3 in no acute distress.  Dermatology: Skin is warm, dry and supple bilateral lower extremities.   Vascular: Palpable pedal pulses bilaterally. Capillary refill within normal limits.  No appreciable edema.  No erythema.  Neurological: Grossly intact via light touch  Musculoskeletal Exam: Pain throughout palpation of the peroneal tendons left just posterior and distal to the fibular malleolus.  Muscle strength 5/5 all compartments  Radiographic Exam RT foot 04/02/2023:  Normal osseous mineralization.  Orthopedic hardware  with degenerative changes noted to the right foot consistent with a posttraumatic DJD.  Orthopedic hardware is appears stable and intact  Radiographic exam LT ankle 04/02/2023: Tibiotalar joint congruent.  Joint spaces mostly preserved.  No acute fractures identified.  Assessment/Plan of Care: 1.  Chronic ankle instability left with chronic pain around the peroneal tendons 2.  DJD right foot 3.  H/o RT foot surgery and LT achilles surgery secondary to mechanical fall.  DOI: 06/03/2003.  Subsequent LT Achilles revision surgery 2007  -Patient evaluated.  X-rays reviewed -Unfortunately the patient continues to have pain and tenderness associated to the left ankle with chronic ankle instability despite conservative care care at [facility] including immobilization in the cam boot, cortisone injections, MRI of the leg, oral anti-inflammatories, and physical therapy but there has been no lasting alleviation of symptoms with the patient -Apparently an MRI of the ankle was never ordered.  MRI ordered LT ankle without contrast -Will contact the patient after MRI results are available to discuss the results and further treatment options       Felecia Shelling, DPM [facility]  Dr. Felecia Shelling, DPM    [address] [place], [place] [address]                Office [phone]  Fax [phone]

## 2023-04-08 ENCOUNTER — Ambulatory Visit
Admission: RE | Admit: 2023-04-08 | Discharge: 2023-04-08 | Disposition: A | Discharge status: Home / Self Care | Payer: BC Managed Care – PPO | Source: Ambulatory Visit | Attending: Podiatry | Admitting: Podiatry

## 2023-04-08 ENCOUNTER — Encounter: Payer: Self-pay | Admitting: Podiatry

## 2023-04-08 DIAGNOSIS — M25372 Other instability, left ankle: Secondary | ICD-10-CM

## 2023-04-08 DIAGNOSIS — M25562 Pain in left knee: Secondary | ICD-10-CM | POA: Diagnosis not present

## 2023-04-08 DIAGNOSIS — M25572 Pain in left ankle and joints of left foot: Secondary | ICD-10-CM | POA: Diagnosis not present

## 2023-04-08 DIAGNOSIS — M7672 Peroneal tendinitis, left leg: Secondary | ICD-10-CM | POA: Diagnosis not present

## 2023-04-15 ENCOUNTER — Ambulatory Visit: Payer: Managed Care, Other (non HMO) | Admitting: Podiatry

## 2023-04-15 DIAGNOSIS — G4733 Obstructive sleep apnea (adult) (pediatric): Secondary | ICD-10-CM | POA: Diagnosis not present

## 2023-04-16 ENCOUNTER — Ambulatory Visit: Payer: BC Managed Care – PPO | Admitting: Podiatry

## 2023-04-16 ENCOUNTER — Other Ambulatory Visit: Payer: BC Managed Care – PPO

## 2023-04-18 ENCOUNTER — Telehealth: Payer: Self-pay | Admitting: Podiatry

## 2023-04-18 NOTE — Telephone Encounter (Signed)
Spoke with patient via telephone. Reviewed MRI. Despite intact ankle ligaments she continues to suffer from chronic ankle instability. We will have her come in for re-evaluation and surgical consultation likely for ankle arthroscopy and lateral ankle stabilization.   -Dr. Logan Bores

## 2023-04-18 NOTE — Telephone Encounter (Signed)
Patient called to state she reached out to lab and they told her you got her results yesterday and would like an update. If you could contact patient and advise new care plan.

## 2023-04-26 DIAGNOSIS — T148XXA Other injury of unspecified body region, initial encounter: Secondary | ICD-10-CM | POA: Diagnosis not present

## 2023-04-26 DIAGNOSIS — S81802A Unspecified open wound, left lower leg, initial encounter: Secondary | ICD-10-CM | POA: Diagnosis not present

## 2023-04-26 DIAGNOSIS — S81012A Laceration without foreign body, left knee, initial encounter: Secondary | ICD-10-CM | POA: Diagnosis not present

## 2023-04-26 DIAGNOSIS — M79605 Pain in left leg: Secondary | ICD-10-CM | POA: Diagnosis not present

## 2023-04-30 ENCOUNTER — Ambulatory Visit (INDEPENDENT_AMBULATORY_CARE_PROVIDER_SITE_OTHER): Payer: BC Managed Care – PPO | Admitting: Podiatry

## 2023-04-30 DIAGNOSIS — M25372 Other instability, left ankle: Secondary | ICD-10-CM | POA: Diagnosis not present

## 2023-04-30 DIAGNOSIS — M7752 Other enthesopathy of left foot: Secondary | ICD-10-CM

## 2023-04-30 NOTE — Progress Notes (Signed)
Chief Complaint  Patient presents with   Ankle Pain    Ankle gave out and fell over the weekend and lacerated her knee. Went to urgent care and had it "glued". Her ankle is not stable and it will give out.     HPI: 52 y.o. female presenting today for follow-up evaluation of bilateral foot and ankle pain.  Patient presenting to review the MRI results of the left ankle.  She continues to have chronic ankle instability which is her main concern.  She says that since last visit she has had multiple episodes of falling due to instability of the ankle and the ankle 'giving way'.   Brief history: Patient has a history of mechanical fall which led to multiple surgeries in 2004.  Patient states that over the past year she has been treated and managed at [facility].  She has chronic ankle instability with ankle pain to the left lower extremity.  She does have history of multiple surgeries to the Achilles tendon.  She is concerned for possible tendon compromise.  She has tried multiple conservative modalities at [facility] including physical therapy, cortisone injections, oral anti-inflammatories, immobilization in cam boot but there is been no improvement.  Presented originally for second opinion  Past Medical History:  Diagnosis Date   Allergy    Anemia    Anxiety    Arthritis    Asthma    with contact with cats   BV (bacterial vaginosis) 08/09/2013   Family history of adverse reaction to anesthesia    mother was sick for days   Hypertension    PONV (postoperative nausea and vomiting)    sts with ablation she was given meds for nausea and "got sicker".   Vaginal bleeding 08/09/2013   Had supra cervical hyst 2012, has had bleeding after sex   Venereal disease 1990   chlamydia   Wears glasses     Past Surgical History:  Procedure Laterality Date   ABDOMINAL HYSTERECTOMY     BREAST CYST EXCISION Left    35 yrs ago- benign   BREAST SURGERY     lump removed from left breast back in the  1980's   BUNIONECTOMY  11/14   rt   CESAREAN SECTION  1995   CHOLECYSTECTOMY N/A 12/26/2020   Procedure: LAPAROSCOPIC CHOLECYSTECTOMY;  Surgeon: Rodman Pickle, MD;  [facility]: [facility];  Service: General;  Laterality: N/A;   ENDOMETRIAL ABLATION  2010   [facility]   FOOT ARTHROPLASTY  11/15   rt-revision bunionectomy-   FOOT SURGERY     FRACTURE SURGERY  2005   left ankle-[place]   HAMMER TOE SURGERY Right 12/01/2014   Procedure: REVISION OF RIGHT 3RD HAMMER TOE CORRECTION;  Surgeon: Toni Arthurs, MD;  Location: [facility];  Service: Orthopedics;  Laterality: Right;   JOINT REPLACEMENT     big toe joint removed   LAPAROSCOPIC SUPRACERVICAL HYSTERECTOMY  07/17/2011   Procedure: LAPAROSCOPIC SUPRACERVICAL HYSTERECTOMY;  Surgeon: Lazaro Arms, MD;  Location: [facility];  Service: Gynecology;  Laterality: N/A;   REMOVAL OF IMPLANT Right 12/01/2014   Procedure: REMOVAL OF  DEEP IMPLANT FROM RIGHT 3RD TOE;  Surgeon: Toni Arthurs, MD;  Location: [facility];  Service: Orthopedics;  Laterality: Right;   THYROIDECTOMY N/A 09/19/2016   Procedure: TOTAL THYROIDECTOMY;  Surgeon: Darnell Level, MD;  Location: [facility] OR;  Service: General;  Laterality: N/A;    Allergies  Allergen Reactions   Bee Venom Anaphylaxis   Celexa [Citalopram Hydrobromide]  Anaphylaxis   Coconut (Cocos Nucifera) Anaphylaxis   Penicillins Anaphylaxis    Has patient had a PCN reaction causing immediate rash, facial/tongue/throat swelling, SOB or lightheadedness with hypotension: Yes Has patient had a PCN reaction causing severe rash involving mucus membranes or skin necrosis: No Has patient had a PCN reaction that required hospitalization No Has patient had a PCN reaction occurring within the last 10 years: No If all of the above answers are "NO", then may proceed with Cephalosporin use.      Physical Exam: General: The patient is alert and oriented x3 in no acute distress.  Dermatology: Skin is warm,  dry and supple bilateral lower extremities.   Vascular: Palpable pedal pulses bilaterally. Capillary refill within normal limits.  No appreciable edema.  No erythema.  Neurological: Grossly intact via light touch  Musculoskeletal Exam: Tenderness throughout palpation of the left ankle joint around the fibular malleolus.  Today there is minimal tenderness around the peroneal tendons.  Positive anterior drawer; there does appear to be ligamentous laxity of the ankle joint with associated tenderness more specifically to the ankle.  Radiographic Exam RT foot 04/02/2023:  Normal osseous mineralization.  Orthopedic hardware with degenerative changes noted to the right foot consistent with a posttraumatic DJD.  Orthopedic hardware is appears stable and intact  Radiographic exam LT ankle 04/02/2023: Tibiotalar joint congruent.  Joint spaces mostly preserved.  No acute fractures identified.  MR ANKLE LEFT WO CONTRAST 04/08/2023 IMPRESSION: 1. Mild tendinosis of the peroneus longus. Moderate tendinosis of the peroneus brevis with tenosynovitis. 2. Moderate sized ankle joint effusion. 3. Mild increased signal and thickening of the medial band of the plantar fascia towards the calcaneal insertion as can be seen with mild plantar fasciitis.  Assessment/Plan of Care: 1.  Chronic ankle instability left with ankle pain 2.  Chronic secondary pain around the peroneal tendons secondary to history of chronic ankle instability and rolling her ankles 3.  DJD right foot 4.  H/o RT foot surgery and LT achilles surgery secondary to mechanical fall.  DOI: 06/03/2003.  Subsequent LT Achilles revision surgery 2007  -Patient evaluated.  MRI reviewed.  Although on MRI there is tendinosis with tenosynovitis of the peroneus brevis tendon the patient's majority of her symptoms is isolated directly to the lateral aspect of the ankle joint.  She also complains of chronic ankle instability.  There is positive anterior drawer  of the ankle joint as well. -Unfortunately the patient continues to have pain and tenderness associated to the left ankle with chronic ankle instability despite conservative care care at [facility] including immobilization in the cam boot, cortisone injections, MRI of the leg, oral anti-inflammatories, and physical therapy but there has been no lasting alleviation of symptoms with the patient - Today we discussed additional conservative modalities but unfortunately the patient has already pursued many conservative modalities at [facility].  Due to the instability of the ankle I do believe the patient would benefit from lateral ankle stabilization procedure.  I do believe that chronic rolling of the ankle and her ankle instability is causing stress to the peroneal tendons.  This was discussed in detail with the patient and she agrees.  Risk benefits advantages and disadvantages of the procedure were explained in detail to the patient.  No guarantees were expressed or implied.  Postoperative recovery course was also explained in detail. -Authorization for surgery was initiated today.  Surgery will consist of ankle arthroscopy left.  Lateral ankle stabilization w/ Arthrex internal brace left -Return to clinic  1 week postop      Felecia Shelling, DPM [facility]  Dr. Felecia Shelling, DPM    [address] [place], [place] [address]                Office [phone]  Fax [phone]

## 2023-05-02 ENCOUNTER — Telehealth: Payer: Self-pay | Admitting: Podiatry

## 2023-05-02 NOTE — Telephone Encounter (Signed)
DOS - 05/08/23  ANKLE ARTHROSCOPY LT - 21308 REPAIR OF ANKLE AND LIGAMENTS LT - 65784  [employer] EFFECTIVE DATE - 09/09/2022  PER IYURO R AT [employer], NO AUTHORIZATION IS REQUIRED FOR CPT CODES 69629 AND [phone].  REFERENCE - IYURO R 04/30/2023 3:23PM EST

## 2023-05-06 DIAGNOSIS — M25579 Pain in unspecified ankle and joints of unspecified foot: Secondary | ICD-10-CM

## 2023-05-08 ENCOUNTER — Other Ambulatory Visit: Payer: Self-pay | Admitting: Podiatry

## 2023-05-08 DIAGNOSIS — M65872 Other synovitis and tenosynovitis, left ankle and foot: Secondary | ICD-10-CM | POA: Diagnosis not present

## 2023-05-08 DIAGNOSIS — M25872 Other specified joint disorders, left ankle and foot: Secondary | ICD-10-CM | POA: Diagnosis not present

## 2023-05-08 DIAGNOSIS — G8918 Other acute postprocedural pain: Secondary | ICD-10-CM | POA: Diagnosis not present

## 2023-05-08 DIAGNOSIS — M19071 Primary osteoarthritis, right ankle and foot: Secondary | ICD-10-CM | POA: Diagnosis not present

## 2023-05-08 DIAGNOSIS — M25372 Other instability, left ankle: Secondary | ICD-10-CM | POA: Diagnosis not present

## 2023-05-08 MED ORDER — OXYCODONE-ACETAMINOPHEN 5-325 MG PO TABS: 1.0000 | ORAL_TABLET | ORAL | 0 refills | Status: DC | PRN

## 2023-05-08 MED ORDER — IBUPROFEN 800 MG PO TABS: 800.0000 mg | ORAL_TABLET | Freq: Three times a day (TID) | ORAL | 1 refills | Status: DC

## 2023-05-08 NOTE — Progress Notes (Signed)
PRN postop  Felecia Shelling, DPM [facility]  Dr. Felecia Shelling, DPM    [address] [place], [place] [address]                Office [phone]  Fax [phone]

## 2023-05-14 ENCOUNTER — Ambulatory Visit (INDEPENDENT_AMBULATORY_CARE_PROVIDER_SITE_OTHER): Payer: BC Managed Care – PPO

## 2023-05-14 ENCOUNTER — Encounter: Payer: Self-pay | Admitting: Podiatry

## 2023-05-14 ENCOUNTER — Ambulatory Visit (INDEPENDENT_AMBULATORY_CARE_PROVIDER_SITE_OTHER): Payer: BC Managed Care – PPO | Admitting: Podiatry

## 2023-05-14 DIAGNOSIS — M25372 Other instability, left ankle: Secondary | ICD-10-CM

## 2023-05-14 DIAGNOSIS — Z9889 Other specified postprocedural states: Secondary | ICD-10-CM

## 2023-05-21 ENCOUNTER — Ambulatory Visit (INDEPENDENT_AMBULATORY_CARE_PROVIDER_SITE_OTHER): Payer: BC Managed Care – PPO | Admitting: Podiatry

## 2023-05-21 DIAGNOSIS — M25372 Other instability, left ankle: Secondary | ICD-10-CM

## 2023-05-21 NOTE — Progress Notes (Signed)
Chief Complaint  Patient presents with   Routine Post Op    POV # 1 DOS 05/08/23 --- AMKLE ARTHROSCOPY LEFT, LATERAL ANKLE STABILIZATION, REPAIR LATERL COLLATERAL LIGAMENT LEFT     Subjective:  Patient presents today status post ankle arthroscopy left with lateral ankle stabilization procedure.  DOS: 05/08/2023.  Patient doing well.  She is nonweightbearing in a cam boot with the assistance of a knee scooter.  Dressings are clean dry and intact.  No new complaints  Past Medical History:  Diagnosis Date   Allergy    Anemia    Anxiety    Arthritis    Asthma    with contact with cats   BV (bacterial vaginosis) 08/09/2013   Family history of adverse reaction to anesthesia    mother was sick for days   Hypertension    PONV (postoperative nausea and vomiting)    sts with ablation she was given meds for nausea and "got sicker".   Vaginal bleeding 08/09/2013   Had supra cervical hyst 2012, has had bleeding after sex   Venereal disease 1990   chlamydia   Wears glasses     Past Surgical History:  Procedure Laterality Date   ABDOMINAL HYSTERECTOMY     BREAST CYST EXCISION Left    35 yrs ago- benign   BREAST SURGERY     lump removed from left breast back in the 1980's   BUNIONECTOMY  11/14   rt   CESAREAN SECTION  1995   CHOLECYSTECTOMY N/A 12/26/2020   Procedure: LAPAROSCOPIC CHOLECYSTECTOMY;  Surgeon: Rodman Pickle, MD;  [facility];  Service: General;  Laterality: N/A;   ENDOMETRIAL ABLATION  2010   [facility]   FOOT ARTHROPLASTY  11/15   rt-revision bunionectomy-   FOOT SURGERY     FRACTURE SURGERY  2005   left ankle-[place]   HAMMER TOE SURGERY Right 12/01/2014   Procedure: REVISION OF RIGHT 3RD HAMMER TOE CORRECTION;  Surgeon: Toni Arthurs, MD;  Location: [facility];  Service: Orthopedics;  Laterality: Right;   JOINT REPLACEMENT     big toe joint removed   LAPAROSCOPIC SUPRACERVICAL HYSTERECTOMY  07/17/2011   Procedure: LAPAROSCOPIC SUPRACERVICAL  HYSTERECTOMY;  Surgeon: Lazaro Arms, MD;  Location: [facility];  Service: Gynecology;  Laterality: N/A;   REMOVAL OF IMPLANT Right 12/01/2014   Procedure: REMOVAL OF  DEEP IMPLANT FROM RIGHT 3RD TOE;  Surgeon: Toni Arthurs, MD;  Location: [facility];  Service: Orthopedics;  Laterality: Right;   THYROIDECTOMY N/A 09/19/2016   Procedure: TOTAL THYROIDECTOMY;  Surgeon: Darnell Level, MD;  Location: [facility] OR;  Service: General;  Laterality: N/A;    Allergies  Allergen Reactions   Bee Venom Anaphylaxis   Celexa [Citalopram Hydrobromide] Anaphylaxis   Coconut (Cocos Nucifera) Anaphylaxis   Penicillins Anaphylaxis    Has patient had a PCN reaction causing immediate rash, facial/tongue/throat swelling, SOB or lightheadedness with hypotension: Yes Has patient had a PCN reaction causing severe rash involving mucus membranes or skin necrosis: No Has patient had a PCN reaction that required hospitalization No Has patient had a PCN reaction occurring within the last 10 years: No If all of the above answers are "NO", then may proceed with Cephalosporin use.     Objective/Physical Exam Neurovascular status intact.  Incision well coapted with sutures intact. No sign of infectious process noted. No dehiscence. No active bleeding noted.  Moderate edema noted to the surgical extremity.  Radiographic Exam LT ankle 05/14/2023:  No acute  fracture identified.  Joint spaces preserved.  Assessment: 1. s/p ankle arthroscopy with lateral ankle stabilization procedure left. DOS: 05/08/2023   Plan of Care:  -Patient was evaluated. X-rays reviewed - Patient doing well.  Continue NWB in the cam boot -Patient may begin washing and showering and getting the foot wet -Return to clinic 2 weeks suture removal   Felecia Shelling, DPM [facility]  Dr. Felecia Shelling, DPM    [address] [place], [place] [address]                Office [phone]  Fax [phone]

## 2023-05-21 NOTE — Progress Notes (Signed)
No chief complaint on file.   Subjective:  Patient presents today status post ankle arthroscopy left with lateral ankle stabilization procedure.  DOS: 05/08/2023.  Patient doing well.  She has started weightbearing in the cam boot with the assistance of a knee scooter occasionally.  No new complaints  Past Medical History:  Diagnosis Date   Allergy    Anemia    Anxiety    Arthritis    Asthma    with contact with cats   BV (bacterial vaginosis) 08/09/2013   Family history of adverse reaction to anesthesia    mother was sick for days   Hypertension    PONV (postoperative nausea and vomiting)    sts with ablation she was given meds for nausea and "got sicker".   Vaginal bleeding 08/09/2013   Had supra cervical hyst 2012, has had bleeding after sex   Venereal disease 1990   chlamydia   Wears glasses     Past Surgical History:  Procedure Laterality Date   ABDOMINAL HYSTERECTOMY     BREAST CYST EXCISION Left    35 yrs ago- benign   BREAST SURGERY     lump removed from left breast back in the 1980's   BUNIONECTOMY  11/14   rt   CESAREAN SECTION  1995   CHOLECYSTECTOMY N/A 12/26/2020   Procedure: LAPAROSCOPIC CHOLECYSTECTOMY;  Surgeon: Rodman Pickle, MD;  [facility]: [facility];  Service: General;  Laterality: N/A;   ENDOMETRIAL ABLATION  2010   [facility]   FOOT ARTHROPLASTY  11/15   rt-revision bunionectomy-   FOOT SURGERY     FRACTURE SURGERY  2005   left ankle-[place]   HAMMER TOE SURGERY Right 12/01/2014   Procedure: REVISION OF RIGHT 3RD HAMMER TOE CORRECTION;  Surgeon: Toni Arthurs, MD;  Location: [facility];  Service: Orthopedics;  Laterality: Right;   JOINT REPLACEMENT     big toe joint removed   LAPAROSCOPIC SUPRACERVICAL HYSTERECTOMY  07/17/2011   Procedure: LAPAROSCOPIC SUPRACERVICAL HYSTERECTOMY;  Surgeon: Lazaro Arms, MD;  Location: [facility];  Service: Gynecology;  Laterality: N/A;   REMOVAL OF IMPLANT Right 12/01/2014   Procedure: REMOVAL OF  DEEP  IMPLANT FROM RIGHT 3RD TOE;  Surgeon: Toni Arthurs, MD;  Location: [facility];  Service: Orthopedics;  Laterality: Right;   THYROIDECTOMY N/A 09/19/2016   Procedure: TOTAL THYROIDECTOMY;  Surgeon: Darnell Level, MD;  Location: [facility] OR;  Service: General;  Laterality: N/A;    Allergies  Allergen Reactions   Bee Venom Anaphylaxis   Celexa [Citalopram Hydrobromide] Anaphylaxis   Coconut (Cocos Nucifera) Anaphylaxis   Penicillins Anaphylaxis    Has patient had a PCN reaction causing immediate rash, facial/tongue/throat swelling, SOB or lightheadedness with hypotension: Yes Has patient had a PCN reaction causing severe rash involving mucus membranes or skin necrosis: No Has patient had a PCN reaction that required hospitalization No Has patient had a PCN reaction occurring within the last 10 years: No If all of the above answers are "NO", then may proceed with Cephalosporin use.     Objective/Physical Exam Neurovascular status intact.  Incision well coapted with sutures intact. No sign of infectious process noted. No dehiscence. No active bleeding noted.  Moderate edema noted to the surgical extremity.  Radiographic Exam LT ankle 05/14/2023:  No acute fracture identified.  Joint spaces preserved.  Assessment: 1. s/p ankle arthroscopy with lateral ankle stabilization procedure left. DOS: 05/08/2023   Plan of Care:  -Patient was evaluated.  -Sutures removed -Silvadene  cream provided to apply daily over the incision site with a light dressing -Continue Ace wrap daily -WBAT cam boot -Return to clinic in 2 weeks to initiate physical therapy.  Physical therapy will be at [facility], DPM [facility]  Dr. Felecia Shelling, DPM    [address] [place], [place] [address]                Office [phone]  Fax [phone]

## 2023-06-04 ENCOUNTER — Encounter: Payer: Self-pay | Admitting: Podiatry

## 2023-06-04 ENCOUNTER — Ambulatory Visit (INDEPENDENT_AMBULATORY_CARE_PROVIDER_SITE_OTHER): Payer: BC Managed Care – PPO

## 2023-06-04 ENCOUNTER — Ambulatory Visit (INDEPENDENT_AMBULATORY_CARE_PROVIDER_SITE_OTHER): Payer: BC Managed Care – PPO | Admitting: Podiatry

## 2023-06-04 DIAGNOSIS — M25372 Other instability, left ankle: Secondary | ICD-10-CM

## 2023-06-04 NOTE — Progress Notes (Signed)
Chief Complaint  Patient presents with   Routine Post Op    POV # 3 DOS 05/08/23 --- AMKLE ARTHROSCOPY LEFT, LATERAL ANKLE STABILIZATION, REPAIR LATERL COLLATERAL LIGAMENT LEFT)    Subjective:  Patient presents today status post ankle arthroscopy left with lateral ankle stabilization procedure.  DOS: 05/08/2023.  Patient continues to do well.  No new complaints   Past Medical History:  Diagnosis Date   Allergy    Anemia    Anxiety    Arthritis    Asthma    with contact with cats   BV (bacterial vaginosis) 08/09/2013   Family history of adverse reaction to anesthesia    mother was sick for days   Hypertension    PONV (postoperative nausea and vomiting)    sts with ablation she was given meds for nausea and "got sicker".   Vaginal bleeding 08/09/2013   Had supra cervical hyst 2012, has had bleeding after sex   Venereal disease 1990   chlamydia   Wears glasses     Past Surgical History:  Procedure Laterality Date   ABDOMINAL HYSTERECTOMY     BREAST CYST EXCISION Left    35 yrs ago- benign   BREAST SURGERY     lump removed from left breast back in the 1980's   BUNIONECTOMY  11/14   rt   CESAREAN SECTION  1995   CHOLECYSTECTOMY N/A 12/26/2020   Procedure: LAPAROSCOPIC CHOLECYSTECTOMY;  Surgeon: Rodman Pickle, MD;  [facility]: [facility];  Service: General;  Laterality: N/A;   ENDOMETRIAL ABLATION  2010   [facility]   FOOT ARTHROPLASTY  11/15   rt-revision bunionectomy-   FOOT SURGERY     FRACTURE SURGERY  2005   left ankle-[place]   HAMMER TOE SURGERY Right 12/01/2014   Procedure: REVISION OF RIGHT 3RD HAMMER TOE CORRECTION;  Surgeon: Toni Arthurs, MD;  Location: [facility];  Service: Orthopedics;  Laterality: Right;   JOINT REPLACEMENT     big toe joint removed   LAPAROSCOPIC SUPRACERVICAL HYSTERECTOMY  07/17/2011   Procedure: LAPAROSCOPIC SUPRACERVICAL HYSTERECTOMY;  Surgeon: Lazaro Arms, MD;  Location: [facility];  Service: Gynecology;  Laterality: N/A;    REMOVAL OF IMPLANT Right 12/01/2014   Procedure: REMOVAL OF  DEEP IMPLANT FROM RIGHT 3RD TOE;  Surgeon: Toni Arthurs, MD;  Location: [facility];  Service: Orthopedics;  Laterality: Right;   THYROIDECTOMY N/A 09/19/2016   Procedure: TOTAL THYROIDECTOMY;  Surgeon: Darnell Level, MD;  Location: [facility];  Service: General;  Laterality: N/A;    Allergies  Allergen Reactions   Bee Venom Anaphylaxis   Celexa [Citalopram Hydrobromide] Anaphylaxis   Coconut (Cocos Nucifera) Anaphylaxis   Penicillins Anaphylaxis    Has patient had a PCN reaction causing immediate rash, facial/tongue/throat swelling, SOB or lightheadedness with hypotension: Yes Has patient had a PCN reaction causing severe rash involving mucus membranes or skin necrosis: No Has patient had a PCN reaction that required hospitalization No Has patient had a PCN reaction occurring within the last 10 years: No If all of the above answers are "NO", then may proceed with Cephalosporin use.     Objective/Physical Exam Neurovascular status intact.  Incisions nicely healed.  There is no tenderness with palpation or range of motion.  No edema.  Overall well-healed surgical ankle  Radiographic Exam LT ankle 05/14/2023:  No acute fracture identified.  Joint spaces preserved.  Assessment: 1. s/p ankle arthroscopy with lateral ankle stabilization procedure left. DOS: 05/08/2023   Plan of  Care:  -Patient was evaluated.  - Patient has discontinued the cam boot.  Continue WBAT in good supportive tennis shoes -Continue physical therapy as needed -Overall the patient is doing very well.  She may eventually work up to full activity no restrictions depending on toleration -Return to clinic as needed   Felecia Shelling, DPM [facility]  Dr. Felecia Shelling, DPM    [address] [place], [place] [address]                Office [phone]  Fax [phone]

## 2023-06-11 DIAGNOSIS — I10 Essential (primary) hypertension: Secondary | ICD-10-CM | POA: Diagnosis not present

## 2023-06-11 DIAGNOSIS — Z23 Encounter for immunization: Secondary | ICD-10-CM | POA: Diagnosis not present

## 2023-06-11 DIAGNOSIS — Z0000 Encounter for general adult medical examination without abnormal findings: Secondary | ICD-10-CM | POA: Diagnosis not present

## 2023-06-11 DIAGNOSIS — Z1159 Encounter for screening for other viral diseases: Secondary | ICD-10-CM | POA: Diagnosis not present

## 2023-06-11 DIAGNOSIS — E039 Hypothyroidism, unspecified: Secondary | ICD-10-CM | POA: Diagnosis not present

## 2023-06-11 DIAGNOSIS — F411 Generalized anxiety disorder: Secondary | ICD-10-CM | POA: Diagnosis not present

## 2023-06-11 DIAGNOSIS — M79641 Pain in right hand: Secondary | ICD-10-CM | POA: Diagnosis not present

## 2023-06-11 DIAGNOSIS — E785 Hyperlipidemia, unspecified: Secondary | ICD-10-CM | POA: Diagnosis not present

## 2023-06-13 ENCOUNTER — Other Ambulatory Visit (HOSPITAL_COMMUNITY): Payer: Self-pay | Admitting: Family Medicine

## 2023-06-13 DIAGNOSIS — E785 Hyperlipidemia, unspecified: Secondary | ICD-10-CM

## 2023-06-24 ENCOUNTER — Ambulatory Visit (HOSPITAL_COMMUNITY): Payer: BC Managed Care – PPO

## 2023-07-01 ENCOUNTER — Encounter (HOSPITAL_COMMUNITY): Payer: Self-pay

## 2023-07-01 ENCOUNTER — Other Ambulatory Visit (HOSPITAL_COMMUNITY): Payer: BC Managed Care – PPO

## 2023-07-09 DIAGNOSIS — I10 Essential (primary) hypertension: Secondary | ICD-10-CM | POA: Diagnosis not present

## 2023-07-18 ENCOUNTER — Ambulatory Visit (INDEPENDENT_AMBULATORY_CARE_PROVIDER_SITE_OTHER): Payer: BC Managed Care – PPO | Admitting: Podiatry

## 2023-07-18 ENCOUNTER — Encounter: Payer: Self-pay | Admitting: Podiatry

## 2023-07-18 DIAGNOSIS — L600 Ingrowing nail: Secondary | ICD-10-CM

## 2023-07-18 NOTE — Patient Instructions (Signed)

## 2023-07-20 NOTE — Progress Notes (Signed)
Subjective:   Patient ID: Doris Davis, female   DOB: 52 y.o.   MRN: [id number]   HPI Patient presents stating she has developed a very painful ingrown toenail on the left big toe and has also had it on the right and states they have been chronic and there for years and years.  States that she had a severe accident and after that nail structure was compromised   ROS      Objective:  Physical Exam  Neurovascular status intact with painful full medial and lateral borders with structural abnormality of the toes themselves     Assessment:  Chronic ingrown toenail deformity with pain hallux bilateral     Plan:  H&P reviewed recommended correction of deformity explained procedure risk and that these will take a number of weeks to heal.  Patient wants surgery and today I infiltrated each big toe 60 mg Xylocaine Marcaine mixture and after appropriate numbness sterile prep done and then with sterile instrumentation remove the medial lateral borders exposed matrix is applied phenol 3 applications 30 seconds followed by alcohol lavage each border applied sterile dressings and instructed on wearing dressings 24 hours but taking them off earlier if throbbing were to occur.  All questions answered today

## 2023-07-30 DIAGNOSIS — G4733 Obstructive sleep apnea (adult) (pediatric): Secondary | ICD-10-CM | POA: Diagnosis not present

## 2023-08-06 DIAGNOSIS — I10 Essential (primary) hypertension: Secondary | ICD-10-CM | POA: Diagnosis not present

## 2023-08-06 DIAGNOSIS — E039 Hypothyroidism, unspecified: Secondary | ICD-10-CM | POA: Diagnosis not present

## 2023-09-01 ENCOUNTER — Ambulatory Visit: Payer: BC Managed Care – PPO

## 2023-09-01 ENCOUNTER — Ambulatory Visit (INDEPENDENT_AMBULATORY_CARE_PROVIDER_SITE_OTHER): Payer: BC Managed Care – PPO

## 2023-09-01 ENCOUNTER — Encounter: Payer: Self-pay | Admitting: Podiatry

## 2023-09-01 ENCOUNTER — Ambulatory Visit (INDEPENDENT_AMBULATORY_CARE_PROVIDER_SITE_OTHER): Payer: BC Managed Care – PPO | Admitting: Podiatry

## 2023-09-01 DIAGNOSIS — M25372 Other instability, left ankle: Secondary | ICD-10-CM | POA: Diagnosis not present

## 2023-09-01 NOTE — Progress Notes (Signed)
Chief Complaint  Patient presents with   Ankle Pain    Left ankle, She reports she is still having pain and swelling. She reports she uses ice and it does not help. She wanted to ask you something that Dr. Charlsie Merles has done.     Subjective:  Patient presents today status post ankle arthroscopy left with lateral ankle stabilization procedure.  DOS: 05/08/2023.  Over the past few months the patient has noticed some chronic tenderness associated to the anterior lateral aspect of the left ankle.  She has not been able to pursue physical therapy and she has been caring for her family who is very sick.  Unfortunately she says that she has had some tenderness to the anterior lateral aspect of the ankle.  Past Medical History:  Diagnosis Date   Allergy    Anemia    Anxiety    Arthritis    Asthma    with contact with cats   BV (bacterial vaginosis) 08/09/2013   Family history of adverse reaction to anesthesia    mother was sick for days   Hypertension    PONV (postoperative nausea and vomiting)    sts with ablation she was given meds for nausea and "got sicker".   Vaginal bleeding 08/09/2013   Had supra cervical hyst 2012, has had bleeding after sex   Venereal disease 1990   chlamydia   Wears glasses     Past Surgical History:  Procedure Laterality Date   ABDOMINAL HYSTERECTOMY     BREAST CYST EXCISION Left    35 yrs ago- benign   BREAST SURGERY     lump removed from left breast back in the 1980's   BUNIONECTOMY  11/14   rt   CESAREAN SECTION  1995   CHOLECYSTECTOMY N/A 12/26/2020   Procedure: LAPAROSCOPIC CHOLECYSTECTOMY;  Surgeon: Rodman Pickle, MD;  Location: [facility];  Service: General;  Laterality: N/A;   ENDOMETRIAL ABLATION  2010   [facility]   FOOT ARTHROPLASTY  11/15   rt-revision bunionectomy-   FOOT SURGERY     FRACTURE SURGERY  2005   left ankle-[facility]   HAMMER TOE SURGERY Right 12/01/2014   Procedure: REVISION OF RIGHT 3RD HAMMER TOE CORRECTION;  Surgeon: Toni Arthurs, MD;  Location: [facility];  Service: Orthopedics;  Laterality: Right;   JOINT REPLACEMENT     big toe joint removed   LAPAROSCOPIC SUPRACERVICAL HYSTERECTOMY  07/17/2011   Procedure: LAPAROSCOPIC SUPRACERVICAL HYSTERECTOMY;  Surgeon: Lazaro Arms, MD;  Location: [facility];  Service: Gynecology;  Laterality: N/A;   REMOVAL OF IMPLANT Right 12/01/2014   Procedure: REMOVAL OF  DEEP IMPLANT FROM RIGHT 3RD TOE;  Surgeon: Toni Arthurs, MD;  Location: [facility];  Service: Orthopedics;  Laterality: Right;   THYROIDECTOMY N/A 09/19/2016   Procedure: TOTAL THYROIDECTOMY;  Surgeon: Darnell Level, MD;  Location: [facility] OR;  Service: General;  Laterality: N/A;    Allergies  Allergen Reactions   Bee Venom Anaphylaxis   Celexa [Citalopram Hydrobromide] Anaphylaxis   Coconut (Cocos Nucifera) Anaphylaxis   Penicillins Anaphylaxis    Has patient had a PCN reaction causing immediate rash, facial/tongue/throat swelling, SOB or lightheadedness with hypotension: Yes Has patient had a PCN reaction causing severe rash involving mucus membranes or skin necrosis: No Has patient had a PCN reaction that required hospitalization No Has patient had a PCN reaction occurring within the last 10 years: No If all of the above answers are "NO", then may proceed with  Cephalosporin use.     Objective/Physical Exam Neurovascular status intact.  Incisions nicely healed.  Today there is some slight tenderness to the anterior lateral aspect of the ankle between the lateral arthroscopy incision and Brostrm incision.  Muscle strength 5/5 all compartments.  Good stability of the ankle joint.  Negative for any instability or anterior drawer.  Radiographic Exam LT ankle 09/01/2023:  No acute fracture identified.  Joint spaces preserved.  Chronic ossicle noted to the lateral aspect of the fibular malleolus however this is chronic and not a new finding  Assessment: 1. s/p ankle arthroscopy with lateral  ankle stabilization procedure left. DOS: 05/08/2023 2.  Capsulitis left ankle   Plan of Care:  -Patient was evaluated.  - Patient ultimately declined cortisone injection.  She will simply observe for now -Continue physical therapy if possible. -Patient is currently caring for her family who is on hospice and unfortunately not in a position to care for her ankle. -Recommend good supportive shoes and sneakers -Return to clinic as needed   Felecia Shelling, DPM [facility]  Dr. Felecia Shelling, DPM    [address] [place], [place] [address]                Office [phone]  Fax [phone]

## 2023-09-09 DIAGNOSIS — R051 Acute cough: Secondary | ICD-10-CM | POA: Diagnosis not present

## 2023-09-09 DIAGNOSIS — J029 Acute pharyngitis, unspecified: Secondary | ICD-10-CM | POA: Diagnosis not present

## 2023-09-09 DIAGNOSIS — J069 Acute upper respiratory infection, unspecified: Secondary | ICD-10-CM | POA: Diagnosis not present

## 2023-09-09 DIAGNOSIS — Z03818 Encounter for observation for suspected exposure to other biological agents ruled out: Secondary | ICD-10-CM | POA: Diagnosis not present

## 2023-09-09 DIAGNOSIS — R233 Spontaneous ecchymoses: Secondary | ICD-10-CM | POA: Diagnosis not present

## 2023-12-04 DIAGNOSIS — I10 Essential (primary) hypertension: Secondary | ICD-10-CM | POA: Diagnosis not present

## 2023-12-04 DIAGNOSIS — E785 Hyperlipidemia, unspecified: Secondary | ICD-10-CM | POA: Diagnosis not present

## 2023-12-04 DIAGNOSIS — E039 Hypothyroidism, unspecified: Secondary | ICD-10-CM | POA: Diagnosis not present

## 2023-12-04 DIAGNOSIS — E669 Obesity, unspecified: Secondary | ICD-10-CM | POA: Diagnosis not present

## 2023-12-10 DIAGNOSIS — I10 Essential (primary) hypertension: Secondary | ICD-10-CM | POA: Diagnosis not present

## 2023-12-10 DIAGNOSIS — E039 Hypothyroidism, unspecified: Secondary | ICD-10-CM | POA: Diagnosis not present

## 2023-12-10 DIAGNOSIS — F458 Other somatoform disorders: Secondary | ICD-10-CM | POA: Diagnosis not present

## 2023-12-10 DIAGNOSIS — F411 Generalized anxiety disorder: Secondary | ICD-10-CM | POA: Diagnosis not present

## 2023-12-23 DIAGNOSIS — G4733 Obstructive sleep apnea (adult) (pediatric): Secondary | ICD-10-CM | POA: Diagnosis not present

## 2024-01-06 ENCOUNTER — Other Ambulatory Visit: Payer: Self-pay | Admitting: Nurse Practitioner

## 2024-01-06 DIAGNOSIS — R109 Unspecified abdominal pain: Secondary | ICD-10-CM | POA: Diagnosis not present

## 2024-01-06 DIAGNOSIS — R197 Diarrhea, unspecified: Secondary | ICD-10-CM | POA: Diagnosis not present

## 2024-01-07 ENCOUNTER — Ambulatory Visit
Admission: RE | Admit: 2024-01-07 | Discharge: 2024-01-07 | Disposition: A | Discharge status: Home / Self Care | Source: Ambulatory Visit | Attending: Nurse Practitioner | Admitting: Nurse Practitioner

## 2024-01-07 DIAGNOSIS — R109 Unspecified abdominal pain: Secondary | ICD-10-CM | POA: Diagnosis not present

## 2024-01-07 DIAGNOSIS — K769 Liver disease, unspecified: Secondary | ICD-10-CM | POA: Diagnosis not present

## 2024-01-08 DIAGNOSIS — G5602 Carpal tunnel syndrome, left upper limb: Secondary | ICD-10-CM | POA: Diagnosis not present

## 2024-01-08 DIAGNOSIS — M65331 Trigger finger, right middle finger: Secondary | ICD-10-CM | POA: Diagnosis not present

## 2024-01-12 DIAGNOSIS — D649 Anemia, unspecified: Secondary | ICD-10-CM | POA: Diagnosis not present

## 2024-01-27 DIAGNOSIS — G4733 Obstructive sleep apnea (adult) (pediatric): Secondary | ICD-10-CM | POA: Diagnosis not present

## 2024-03-25 DIAGNOSIS — G4733 Obstructive sleep apnea (adult) (pediatric): Secondary | ICD-10-CM | POA: Diagnosis not present

## 2024-04-05 DIAGNOSIS — K2980 Duodenitis without bleeding: Secondary | ICD-10-CM | POA: Diagnosis not present

## 2024-04-05 DIAGNOSIS — D509 Iron deficiency anemia, unspecified: Secondary | ICD-10-CM | POA: Diagnosis not present

## 2024-04-05 DIAGNOSIS — K648 Other hemorrhoids: Secondary | ICD-10-CM | POA: Diagnosis not present

## 2024-04-05 DIAGNOSIS — R197 Diarrhea, unspecified: Secondary | ICD-10-CM | POA: Diagnosis not present

## 2024-04-05 DIAGNOSIS — K5730 Diverticulosis of large intestine without perforation or abscess without bleeding: Secondary | ICD-10-CM | POA: Diagnosis not present

## 2024-04-05 DIAGNOSIS — K449 Diaphragmatic hernia without obstruction or gangrene: Secondary | ICD-10-CM | POA: Diagnosis not present

## 2024-04-05 DIAGNOSIS — K625 Hemorrhage of anus and rectum: Secondary | ICD-10-CM | POA: Diagnosis not present

## 2024-04-05 DIAGNOSIS — Z1211 Encounter for screening for malignant neoplasm of colon: Secondary | ICD-10-CM | POA: Diagnosis not present

## 2024-04-05 DIAGNOSIS — D128 Benign neoplasm of rectum: Secondary | ICD-10-CM | POA: Diagnosis not present

## 2024-04-05 DIAGNOSIS — K317 Polyp of stomach and duodenum: Secondary | ICD-10-CM | POA: Diagnosis not present

## 2024-04-05 DIAGNOSIS — K222 Esophageal obstruction: Secondary | ICD-10-CM | POA: Diagnosis not present

## 2024-04-05 DIAGNOSIS — K3189 Other diseases of stomach and duodenum: Secondary | ICD-10-CM | POA: Diagnosis not present

## 2024-04-07 DIAGNOSIS — R319 Hematuria, unspecified: Secondary | ICD-10-CM | POA: Diagnosis not present

## 2024-05-06 DIAGNOSIS — R5383 Other fatigue: Secondary | ICD-10-CM | POA: Diagnosis not present

## 2024-05-13 DIAGNOSIS — G5602 Carpal tunnel syndrome, left upper limb: Secondary | ICD-10-CM | POA: Diagnosis not present

## 2024-05-13 DIAGNOSIS — M65331 Trigger finger, right middle finger: Secondary | ICD-10-CM | POA: Diagnosis not present

## 2024-06-09 DIAGNOSIS — Z23 Encounter for immunization: Secondary | ICD-10-CM | POA: Diagnosis not present

## 2024-06-17 DIAGNOSIS — D649 Anemia, unspecified: Secondary | ICD-10-CM | POA: Diagnosis not present

## 2024-06-17 DIAGNOSIS — E039 Hypothyroidism, unspecified: Secondary | ICD-10-CM | POA: Diagnosis not present

## 2024-06-17 DIAGNOSIS — I10 Essential (primary) hypertension: Secondary | ICD-10-CM | POA: Diagnosis not present

## 2024-06-17 DIAGNOSIS — E785 Hyperlipidemia, unspecified: Secondary | ICD-10-CM | POA: Diagnosis not present

## 2024-06-17 DIAGNOSIS — E669 Obesity, unspecified: Secondary | ICD-10-CM | POA: Diagnosis not present

## 2024-06-21 DIAGNOSIS — E785 Hyperlipidemia, unspecified: Secondary | ICD-10-CM | POA: Diagnosis not present

## 2024-06-21 DIAGNOSIS — E039 Hypothyroidism, unspecified: Secondary | ICD-10-CM | POA: Diagnosis not present

## 2024-06-21 DIAGNOSIS — E669 Obesity, unspecified: Secondary | ICD-10-CM | POA: Diagnosis not present

## 2024-06-21 DIAGNOSIS — I10 Essential (primary) hypertension: Secondary | ICD-10-CM | POA: Diagnosis not present

## 2024-06-22 ENCOUNTER — Other Ambulatory Visit (HOSPITAL_BASED_OUTPATIENT_CLINIC_OR_DEPARTMENT_OTHER): Payer: Self-pay | Admitting: Family Medicine

## 2024-06-22 DIAGNOSIS — E785 Hyperlipidemia, unspecified: Secondary | ICD-10-CM

## 2024-06-23 ENCOUNTER — Ambulatory Visit (HOSPITAL_COMMUNITY)
Admission: RE | Admit: 2024-06-23 | Discharge: 2024-06-23 | Disposition: A | Discharge status: Home / Self Care | Payer: Self-pay | Source: Ambulatory Visit | Attending: Cardiovascular Disease | Admitting: Cardiovascular Disease

## 2024-06-23 ENCOUNTER — Ambulatory Visit (HOSPITAL_COMMUNITY)
Admission: RE | Admit: 2024-06-23 | Discharge: 2024-06-23 | Disposition: A | Discharge status: Home / Self Care | Source: Ambulatory Visit | Attending: Cardiovascular Disease | Admitting: Cardiovascular Disease

## 2024-06-23 ENCOUNTER — Other Ambulatory Visit (HOSPITAL_COMMUNITY): Payer: Self-pay | Admitting: Family Medicine

## 2024-06-23 DIAGNOSIS — R011 Cardiac murmur, unspecified: Secondary | ICD-10-CM | POA: Diagnosis not present

## 2024-06-23 DIAGNOSIS — E785 Hyperlipidemia, unspecified: Secondary | ICD-10-CM | POA: Insufficient documentation

## 2024-06-23 DIAGNOSIS — Z136 Encounter for screening for cardiovascular disorders: Secondary | ICD-10-CM | POA: Diagnosis not present

## 2024-06-23 DIAGNOSIS — I2510 Atherosclerotic heart disease of native coronary artery without angina pectoris: Secondary | ICD-10-CM | POA: Diagnosis not present

## 2024-06-23 LAB — ECHOCARDIOGRAM COMPLETE
Area-P 1/2: 3.58 cm2
S' Lateral: 2.4 cm

## 2024-06-28 ENCOUNTER — Ambulatory Visit: Admitting: Physician Assistant

## 2024-06-28 ENCOUNTER — Ambulatory Visit: Attending: Physician Assistant | Admitting: Physician Assistant

## 2024-06-28 ENCOUNTER — Ambulatory Visit

## 2024-06-28 ENCOUNTER — Encounter: Payer: Self-pay | Admitting: Physician Assistant

## 2024-06-28 VITALS — BP 144/90 | HR 73 | Ht 64.0 in | Wt 193.8 lb

## 2024-06-28 DIAGNOSIS — R011 Cardiac murmur, unspecified: Secondary | ICD-10-CM

## 2024-06-28 DIAGNOSIS — I498 Other specified cardiac arrhythmias: Secondary | ICD-10-CM

## 2024-06-28 DIAGNOSIS — R072 Precordial pain: Secondary | ICD-10-CM | POA: Diagnosis not present

## 2024-06-28 MED ORDER — METOPROLOL TARTRATE 50 MG PO TABS: ORAL_TABLET | ORAL | 0 refills | Status: DC

## 2024-06-28 NOTE — Patient Instructions (Signed)
 Medication Instructions:  Metoprolol Tartrate 50 mg take 1 tablet 2 hour prior to cardiac ct  Your physician recommends that you continue on your current medications as directed. Please refer to the Current Medication list given to you today.  *If you need a refill on your cardiac medications before your next appointment, please call your pharmacy*  Lab Work: NONE ordered at this time of appointment   Testing/Procedures:   Your cardiac CT will be scheduled at one of the below locations:   [facility] [address]  [place], [place] [address]  If scheduled at [facility], please arrive at the [facility] and Children's Entrance (Entrance C2) of [facility] 30 minutes prior to test start time. You can use the FREE valet parking offered at entrance C (encouraged to control the heart rate for the test)  Proceed to the [facility] Radiology Department (first floor) to check-in and test prep.  All radiology patients and guests should use entrance C2 at [facility], accessed from [facility], even though the hospital's physical address listed is [address]  If scheduled at the Heart and Vascular Tower at [address], please enter the parking lot using the [address] entrance and use the FREE valet service at the patient drop-off area. Enter the building and check-in with registration on the main floor.  If scheduled at [facility], please arrive to the Heart and Vascular Center 15 mins early for check-in and test prep.  There is spacious parking and easy access to the radiology department from the [facility] entrance. Please enter here and check-in with the desk attendant.   If scheduled at [facility], please arrive 30 minutes early for check-in and test prep.  Please follow these instructions carefully (unless otherwise directed):  An IV will be required for this  test and Nitroglycerin  will be given.  Hold all erectile dysfunction medications at least 3 days (72 hrs) prior to test. (Ie viagra, cialis, sildenafil, tadalafil, etc)   On the Night Before the Test: Be sure to Drink plenty of water. Do not consume any caffeinated/decaffeinated beverages or chocolate 12 hours prior to your test. Do not take any antihistamines 12 hours prior to your test.  On the Day of the Test: Drink plenty of water until 1 hour prior to the test. Do not eat any food 1 hour prior to test. You may take your regular medications prior to the test.  Take metoprolol (Lopressor) two hours prior to test. Please hold Losartan  hydrochlorothiazide  the day of procedure. If you take Furosemide/Hydrochlorothiazide /Spironolactone/Chlorthalidone, please HOLD on the morning of the test. Patients who wear a continuous glucose monitor MUST remove the device prior to scanning. FEMALES- please wear underwire-free bra if available, avoid dresses & tight clothing         After the Test: Drink plenty of water. After receiving IV contrast, you may experience a mild flushed feeling. This is normal. On occasion, you may experience a mild rash up to 24 hours after the test. This is not dangerous. If this occurs, you can take Benadryl  25 mg, Zyrtec, Claritin, or Allegra and increase your fluid intake. (Patients taking Tikosyn should avoid Benadryl , and may take Zyrtec, Claritin, or Allegra) If you experience trouble breathing, this can be serious. If it is severe call 911 IMMEDIATELY. If it is mild, please call our office.  We will call to schedule your test 2-4 weeks  out understanding that some insurance companies will need an authorization prior to the service being performed.   For more information and frequently asked questions, please visit our website : [email or web address]  For non-scheduling related questions, please contact the cardiac imaging nurse navigator should you have  any questions/concerns: Cardiac Imaging Nurse Navigators Direct Office Dial: [phone]   For scheduling needs, including cancellations and rescheduling, please call [place], [phone].  ZIO XT- Long Term Monitor Instructions  Your physician has requested you wear a ZIO patch monitor for 14 days.  This is a single patch monitor. [employer] supplies one patch monitor per enrollment. Additional stickers are not available. Please do not apply patch if you will be having a Nuclear Stress Test,  Echocardiogram, Cardiac CT, MRI, or Chest Xray during the period you would be wearing the  monitor. The patch cannot be worn during these tests. You cannot remove and re-apply the  ZIO XT patch monitor.  Your ZIO patch monitor will be mailed 3 day USPS to your address on file. It may take 3-5 days  to receive your monitor after you have been enrolled.  Once you have received your monitor, please review the enclosed instructions. Your monitor  has already been registered assigning a specific monitor serial # to you.  Billing and Patient Assistance Program Information  We have supplied [employer] with any of your insurance information on file for billing purposes. [employer] offers a sliding scale Patient Assistance Program for patients that do not have  insurance, or whose insurance does not completely cover the cost of the ZIO monitor.  You must apply for the Patient Assistance Program to qualify for this discounted rate.  To apply, please call [employer] at [phone], select option 4, select option 2, ask to apply for  Patient Assistance Program. [place] will ask your household income, and how many people  are in your household. They will quote your out-of-pocket cost based on that information.  [employer] will also be able to set up a 31-month, interest-free payment plan if needed.  Applying the monitor   Shave hair from upper left chest.  Hold abrader disc by orange tab. Rub abrader in 40 strokes  over the upper left chest as  indicated in your monitor instructions.  Clean area with 4 enclosed alcohol pads. Let dry.  Apply patch as indicated in monitor instructions. Patch will be placed under collarbone on left  side of chest with arrow pointing upward.  Rub patch adhesive wings for 2 minutes. Remove white label marked 1. Remove the white  label marked 2. Rub patch adhesive wings for 2 additional minutes.  While looking in a mirror, press and release button in center of patch. A small green light will  flash 3-4 times. This will be your only indicator that the monitor has been turned on.  Do not shower for the first 24 hours. You may shower after the first 24 hours.  Press the button if you feel a symptom. You will hear a small click. Record Date, Time and  Symptom in the Patient Logbook.  When you are ready to remove the patch, follow instructions on the last 2 pages of Patient  Logbook. Stick patch monitor onto the last page of Patient Logbook.  Place Patient Logbook in the blue and white box. Use locking tab on box and tape box closed  securely. The blue and white box has prepaid postage on it. Please place it in the mailbox as  soon as possible. Your physician  should have your test results approximately 7 days after the  monitor has been mailed back to [facility].  Call [facility] Customer Care at [phone] if you have questions regarding  your ZIO XT patch monitor. Call them immediately if you see an orange light blinking on your  monitor.  If your monitor falls off in less than 4 days, contact our Monitor department at [phone].  If your monitor becomes loose or falls off after 4 days call [employer] at [phone] for  suggestions on securing your monitor    Follow-Up: At [facility], you and your health needs are our priority.  As part of our continuing mission to provide you with exceptional heart care, our providers are all part of one team.   This team includes your primary Cardiologist (physician) and Advanced Practice Providers or APPs (Physician Assistants and Nurse Practitioners) who all work together to provide you with the care you need, when you need it.  Your next appointment:   6 week(s)  Provider:   Scot Ford, PA-C          We recommend signing up for the patient portal called MyChart.  Sign up information is provided on this After Visit Summary.  MyChart is used to connect with patients for Virtual Visits (Telemedicine).  Patients are able to view lab/test results, encounter notes, upcoming appointments, etc.  Non-urgent messages can be sent to your provider as well.   To learn more about what you can do with MyChart, go to [email or web address].
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
## 2024-06-28 NOTE — Progress Notes (Unsigned)
 Enrolled patient for a 14 day Zio XT  monitor to be mailed to patients home

## 2024-07-05 ENCOUNTER — Other Ambulatory Visit (HOSPITAL_COMMUNITY)

## 2024-07-05 ENCOUNTER — Ambulatory Visit: Admitting: Nurse Practitioner

## 2024-07-08 ENCOUNTER — Encounter (HOSPITAL_COMMUNITY): Payer: Self-pay

## 2024-07-12 ENCOUNTER — Ambulatory Visit (HOSPITAL_COMMUNITY)
Admission: RE | Admit: 2024-07-12 | Discharge: 2024-07-12 | Disposition: A | Discharge status: Home / Self Care | Source: Ambulatory Visit | Attending: Physician Assistant | Admitting: Physician Assistant

## 2024-07-12 DIAGNOSIS — R931 Abnormal findings on diagnostic imaging of heart and coronary circulation: Secondary | ICD-10-CM | POA: Insufficient documentation

## 2024-07-12 DIAGNOSIS — I2510 Atherosclerotic heart disease of native coronary artery without angina pectoris: Secondary | ICD-10-CM

## 2024-07-12 DIAGNOSIS — R072 Precordial pain: Secondary | ICD-10-CM | POA: Diagnosis not present

## 2024-07-12 HISTORY — DX: Atherosclerotic heart disease of native coronary artery without angina pectoris: I25.10

## 2024-07-12 MED ORDER — IOHEXOL 350 MG/ML SOLN
100.0000 mL | Freq: Once | INTRAVENOUS | Status: AC | PRN
  Administered 2024-07-12: 100 mL via INTRAVENOUS

## 2024-07-13 ENCOUNTER — Ambulatory Visit: Payer: Self-pay | Admitting: Physician Assistant

## 2024-07-13 ENCOUNTER — Ambulatory Visit: Attending: Physician Assistant | Admitting: Physician Assistant

## 2024-07-13 ENCOUNTER — Other Ambulatory Visit: Payer: Self-pay | Admitting: Student in an Organized Health Care Education/Training Program

## 2024-07-13 ENCOUNTER — Ambulatory Visit (HOSPITAL_BASED_OUTPATIENT_CLINIC_OR_DEPARTMENT_OTHER)
Admission: RE | Admit: 2024-07-13 | Discharge: 2024-07-13 | Disposition: A | Discharge status: Home / Self Care | Source: Ambulatory Visit | Attending: Student in an Organized Health Care Education/Training Program | Admitting: Student in an Organized Health Care Education/Training Program

## 2024-07-13 VITALS — BP 124/82 | HR 86 | Wt 191.8 lb

## 2024-07-13 DIAGNOSIS — I2510 Atherosclerotic heart disease of native coronary artery without angina pectoris: Secondary | ICD-10-CM | POA: Diagnosis not present

## 2024-07-13 DIAGNOSIS — I10 Essential (primary) hypertension: Secondary | ICD-10-CM | POA: Diagnosis not present

## 2024-07-13 DIAGNOSIS — R931 Abnormal findings on diagnostic imaging of heart and coronary circulation: Secondary | ICD-10-CM | POA: Diagnosis not present

## 2024-07-13 DIAGNOSIS — R002 Palpitations: Secondary | ICD-10-CM

## 2024-07-13 DIAGNOSIS — E785 Hyperlipidemia, unspecified: Secondary | ICD-10-CM

## 2024-07-13 DIAGNOSIS — R072 Precordial pain: Secondary | ICD-10-CM | POA: Diagnosis not present

## 2024-07-13 DIAGNOSIS — Z79899 Other long term (current) drug therapy: Secondary | ICD-10-CM

## 2024-07-13 MED ORDER — METOPROLOL TARTRATE 25 MG PO TABS: 12.5000 mg | ORAL_TABLET | Freq: Two times a day (BID) | ORAL | 3 refills | Status: DC

## 2024-07-13 NOTE — Progress Notes (Signed)
 Please arrange a earlier visit with me tomorrow

## 2024-07-13 NOTE — Progress Notes (Signed)
 Need a earlier office visit to discuss result.

## 2024-07-13 NOTE — H&P (View-Only) (Signed)
 Cardiology Office Note   Date:  07/13/2024  ID:  Doris Davis, DOB Sep 08, 1971, MRN [id number] PCP: Leonel Cole, MD  [facility] Providers Cardiologist:  [facility] - will set up with Dr. Anner  History of Present Illness Doris Davis is a 53 y.o. female with past medical history of hypertension, hyperlipidemia, anxiety, ADHD, OSA and hypothyroidism but no cardiac history.  She take losartan -HCTZ, amlodipine and levothyroxine  at home.  She was recently seen by her PCP and a complaint of nocturnal leg burning sensation when she lay down and also fluttering sensation in her chest and back about once a week.  She also mentioned a blood pressure jumped up to 200/140 during stressful episode.  During physical exam, she was noted to have a faint heart murmur.  She is caring for her father who has frequent hospitalization due to heart failure.  Blood work obtained on 06/17/2024 showed a low iron level, normal ferritin level and normal transferrin.  Hemoglobin A1c 5.7.  Cholesterol 250, triglyceride 233, LDL 153, HDL 55.  Creatinine 0.65, normal electrolyte and liver enzyme.  Microalbumin to creatinine ratio normal.  Hemoglobin 10.8, hematocrit 32.6, white blood cell count 6.7.  TSH 2.54.  Coronary calcium  scoring test obtained on 06/23/2024 showed total calcium  score of 167, 97 percentile for age and sex matched control.  Echocardiogram obtained on 06/23/2024 showed EF 60 to 65%, normal RV, trivial MR. During the last office visit, she had atypical chest pain however I was more concerned about her dyspnea on exertion and fatigue.  She was also having palpitation which I suspected was related to bursts of SVT.  She has significant family history of heart disease.  With her high coronary calcium  score, I ordered a coronary CTA and a ZIO XT heart monitor.  Patient presents today to review her coronary CT.  Coronary CTA came back showing mild to moderate disease in OM and severe disease in mid  RCA with positive FFR.  Risk and benefit of cardiac catheterization has been discussed with the patient.  Her case has also been reviewed with Doris Davis who is agreeable to proceed with cardiac catheterization.  She has been started on rosuvastatin 20 mg and 81 mg aspirin  by her PCP.  I will add 12.5 mg twice a day of metoprolol tartrate.  I plan to see the patient back in  4 weeks for follow-up.  ROS:   She complains of dyspnea on exertion and fatigue.  She has some atypical chest pain and palpitation.   Studies Reviewed      Cardiac Studies & Procedures   ______________________________________________________________________________________________     ECHOCARDIOGRAM  ECHOCARDIOGRAM COMPLETE 06/23/2024  Narrative ECHOCARDIOGRAM REPORT    Patient Name:   Doris Davis Date of Exam: 06/23/2024 Medical Rec #:  [id number]      Height:       63.0 in Accession #:    [id number]     Weight:       190.0 lb Date of Birth:  1971-09-04      BSA:          1.892 m Patient Age:    53 years       BP:           149/90 mmHg Patient Gender: F              HR:           90 bpm. Exam Location:  [address]  Procedure: 2D  Echo, Cardiac Doppler and Color Doppler (Both Spectral and Color Flow Doppler were utilized during procedure).  Indications:    R01.1 Murmur  History:        Patient has no prior history of Echocardiogram examinations. Signs/Symptoms:Murmur; Risk Factors:Hypertension.  Sonographer:    Doris Davis RDCS Referring Phys: [id number] Doris Davis  IMPRESSIONS   1. Left ventricular ejection fraction, by estimation, is 60 to 65%. The left ventricle has normal function. The left ventricle has no regional wall motion abnormalities. Left ventricular diastolic parameters were normal. 2. Right ventricular systolic function is normal. The right ventricular size is normal. Tricuspid regurgitation signal is inadequate for assessing PA pressure. 3. The mitral valve is abnormal.  Trivial mitral valve regurgitation. No evidence of mitral stenosis. 4. The aortic valve is tricuspid. Aortic valve regurgitation is not visualized. No aortic stenosis is present. 5. The inferior vena cava is normal in size with greater than 50% respiratory variability, suggesting right atrial pressure of 3 mmHg.  Comparison(s): No prior Echocardiogram.  Conclusion(s)/Recommendation(s): Otherwise normal echocardiogram, with minor abnormalities described in the report.  FINDINGS Left Ventricle: Left ventricular ejection fraction, by estimation, is 60 to 65%. The left ventricle has normal function. The left ventricle has no regional wall motion abnormalities. The left ventricular internal cavity size was normal in size. There is no left ventricular hypertrophy. Left ventricular diastolic parameters were normal.  Right Ventricle: The right ventricular size is normal. No increase in right ventricular wall thickness. Right ventricular systolic function is normal. Tricuspid regurgitation signal is inadequate for assessing PA pressure.  Left Atrium: Left atrial size was normal in size.  Right Atrium: Right atrial size was normal in size.  Pericardium: There is no evidence of pericardial effusion.  Mitral Valve: The mitral valve is abnormal. There is mild thickening of the mitral valve leaflet(s). Trivial mitral valve regurgitation. No evidence of mitral valve stenosis.  Tricuspid Valve: The tricuspid valve is normal in structure. Tricuspid valve regurgitation is not demonstrated. No evidence of tricuspid stenosis.  Aortic Valve: The aortic valve is tricuspid. Aortic valve regurgitation is not visualized. No aortic stenosis is present.  Pulmonic Valve: The pulmonic valve was normal in structure. Pulmonic valve regurgitation is trivial. No evidence of pulmonic stenosis.  Aorta: The aortic root and ascending aorta are structurally normal, with no evidence of dilitation.  Venous: The inferior vena  cava is normal in size with greater than 50% respiratory variability, suggesting right atrial pressure of 3 mmHg.  IAS/Shunts: No atrial level shunt detected by color flow Doppler.   LEFT VENTRICLE PLAX 2D LVIDd:         3.70 cm   Diastology LVIDs:         2.40 cm   LV e' medial:    6.85 cm/s LV PW:         1.00 cm   LV E/e' medial:  9.0 LV IVS:        1.60 cm   LV e' lateral:   13.80 cm/s LVOT diam:     1.90 cm   LV E/e' lateral: 4.5 LV SV:         54 LV SV Index:   29 LVOT Area:     2.84 cm   RIGHT VENTRICLE             IVC RV S prime:     13.50 cm/s  IVC diam: 1.40 cm TAPSE (M-mode): 1.7 cm  LEFT ATRIUM  Index        RIGHT ATRIUM           Index LA diam:        3.70 cm 1.96 cm/m   RA Pressure: 3.00 mmHg LA Vol (A2C):   36.7 ml 19.40 ml/m  RA Area:     10.50 cm LA Vol (A4C):   32.1 ml 16.97 ml/m  RA Volume:   25.50 ml  13.48 ml/m LA Biplane Vol: 34.4 ml 18.18 ml/m AORTIC VALVE LVOT Vmax:   99.80 cm/s LVOT Vmean:  69.800 cm/s LVOT VTI:    0.192 m  AORTA Ao Root diam: 3.40 cm Ao Asc diam:  3.30 cm  MITRAL VALVE               TRICUSPID VALVE MV Area (PHT): 3.58 cm    Estimated RAP:  3.00 mmHg MV Decel Time: 212 msec MV E velocity: 61.80 cm/s  SHUNTS MV A velocity: 83.80 cm/s  Systemic VTI:  0.19 m MV E/A ratio:  0.74        Systemic Diam: 1.90 cm  Doris Davis Electronically signed by Doris Davis Signature Date/Time: 06/23/2024/5:33:00 PM    Final      CT SCANS  CT CORONARY MORPH W/CTA COR W/SCORE 07/12/2024  Narrative HISTORY: chest pain  EXAM: Cardiac/Coronary CT  TECHNIQUE: The patient was scanned on a [employer].  PROTOCOL: A 120 kV prospective scan was triggered in the descending thoracic aorta at 111 HU's. Axial non-contrast 3 mm slices were carried out through the heart. The data set was analyzed on a dedicated work station and scored using the Agatston method. Gantry rotation speed was 250 msecs and collimation  was 0.6 mm. Heart rate was optimized medically and sl NTG was given. The 3D data set was reconstructed in 5% intervals of the 35-75 % of the R-R cycle. Systolic and diastolic phases were analyzed on a dedicated work station using MPR, MIP and VRT modes. The patient received OMNIPAQUE  IOHEXOL  350 MG/ML SOLN of contrast.  FINDINGS: Image Quality: Good  Coronary calcium  score: The patient's coronary artery calcium  score is 148, which places the patient in the 97th percentile.  Coronary arteries: Right dominant coronary system. Normal coronary origins.  Left Main: Normal caliber vessel. No significant plaque or stenosis.  Left Anterior Descending: Normal caliber vessel. The proximal to distal LAD has mild scattered, mixed, calcified and non-calcified plaque resulting in <25% stenosis.  Diagonal branches: Normal caliber vessels without significant plaque or stenosis proximally. The distal aspects of these vessels are too small to characterize.  Left Circumflex Artery: Normal caliber vessel that becomes diminutive distally. There is minimal calcified plaque in the proximal LCx resulting in <25% stenosis.  Obtuse Marginal branches: OM1 has a high origin off of the LCx and is without significant plaque or stenosis. OM2 is a large vessel that has a segment of luminal narrowing from either non-calcified plaque or artifact resulting in 25-49% stenosis. The remaining vessel is largely free of disease.  Right Coronary Artery: Normal caliber vessel, gives rise to PDA. The mid-RCA has a severe stenosis from predominately non-calcified plaque resulting in 70-99% stenosis. The proximal vessel has minimal calcified plaque. The distal vessel is largely free of disease.  EXTRA-CORONARY FINDINGS: EXTRA-CORONARY FINDINGS Aorta: Normal size, 31 mm x 31 mm at the mid ascending aorta (level of the PA bifurcation) measured double oblique. There is mild aortic calcifications in the descending  thoracic aorta. No dissection seen in visualized portions  of the aorta.  Normal size of the pulmonary artery  Aortic Valve: Trileaflet without calcifications.  Normal pulmonary vein drainage into the left atrium.  Normal left atrial appendage without a thrombus.  Normal appearance of the pericardium.  Non-cardiac findings will be assessed and reported in a separate document by radiology.  IMPRESSION: 1. There is evidence of mixed calcified and non-calcified coronary artery disease in all 3 major epicardial coronary arteries. The most notable disease is seen in the mid RCA with a 70-99% stenosis resulting from non-calcified plaque. The second OM branch has a proximal stenosis of 25-49%, but it is unclear if this is a true lesion from soft plaque or artifact. CADRADS = 4A. CT FFR will be performed.  2. Coronary calcium  score of 148. This was 97th percentile for age-, sex-, and race- matched controls.  3. Total plaque volume = 124 mm3.  4. Normal coronary origin with right dominance.  INTERPRETATION:  CAD-RADS 4: Severe stenosis. (70-99% or > 50% left main). Cardiac catheterization or CT FFR is recommended. Consider symptom-guided anti-ischemic pharmacotherapy as well as risk factor modification per guideline directed care. Invasive coronary angiography recommended with revascularization per published guideline statements.   Electronically Signed By: Doris Davis On: 07/13/2024 11:02   CT SCANS  CT CARDIAC SCORING (SELF PAY ONLY) 06/30/2024  Addendum 06/30/2024  1:34 AM ADDENDUM REPORT: 06/30/2024 01:31  EXAM: OVER-READ INTERPRETATION  CT CHEST  The following report is an over-read performed by radiologist Dr. Oneil Davis of [facility] on 06/30/2024. This over-read does not include interpretation of cardiac or coronary anatomy or pathology. The coronary calcium  score interpretation by the cardiologist is attached.  COMPARISON:   12/25/2020  FINDINGS: Cardiovascular: Atherosclerotic calcifications of the aorta are seen without aneurysmal dilatation.  Mediastinum/Nodes: There are no enlarged lymph nodes within the visualized mediastinum.  Lungs/Pleura: There is no pleural effusion. The visualized lungs appear clear.  Upper abdomen: No significant findings in the visualized upper abdomen.  Musculoskeletal/Chest wall: No chest wall mass or suspicious osseous findings within the visualized chest.  IMPRESSION: Aortic Atherosclerosis (ICD10-I70.0).   Electronically Signed By: Doris Davis M.D. On: 06/30/2024 01:31  Narrative CLINICAL DATA:  Cardiovascular Disease Risk stratification  EXAM: Coronary Calcium  Score  TECHNIQUE: A gated, non-contrast computed tomography scan of the heart was performed using 3mm slice thickness. Axial images were analyzed on a dedicated workstation. Calcium  scoring of the coronary arteries was performed using the Agatston method.  FINDINGS: Coronary arteries: Normal origins.  Coronary Calcium  Score:  Left main: 0  Left anterior descending artery: 125  Left circumflex artery: 40.1  Right coronary artery: 2.8  Total: 167  Percentile: 97th  Pericardium: Normal.  Ascending Aorta: Normal caliber.  Non-cardiac: See separate report from [facility].  IMPRESSION: Coronary calcium  score of 167. This was 97th percentile for age-, race-, and sex-matched controls.  RECOMMENDATIONS: Coronary artery calcium  (CAC) score is a strong predictor of incident coronary heart disease (CHD) and provides predictive information beyond traditional risk factors. CAC scoring is reasonable to use in the decision to withhold, postpone, or initiate statin therapy in intermediate-risk or selected borderline-risk asymptomatic adults (age 74-75 years and LDL-C >=70 to <190 mg/dL) who do not have diabetes or established atherosclerotic cardiovascular disease (ASCVD).* In  intermediate-risk (10-year ASCVD risk >=7.5% to <20%) adults or selected borderline-risk (10-year ASCVD risk >=5% to <7.5%) adults in whom a CAC score is measured for the purpose of making a treatment decision the following recommendations have been made:  If CAC=0, it is reasonable to withhold statin therapy and reassess in 5 to 10 years, as long as higher risk conditions are absent (diabetes mellitus, family history of premature CHD in first degree relatives (males <55 years; females <65 years), cigarette smoking, or LDL >=190 mg/dL).  If CAC is 1 to 99, it is reasonable to initiate statin therapy for patients >=24 years of age.  If CAC is >=100 or >=75th percentile, it is reasonable to initiate statin therapy at any age.  Cardiology referral should be considered for patients with CAC scores >=400 or >=75th percentile.  *2018 AHA/ACC/AACVPR/AAPA/ABC/ACPM/ADA/AGS/APhA/ASPC/NLA/PCNA Guideline on the Management of Blood Cholesterol: A Report of the American College of Cardiology/American Heart Association Task Force on Clinical Practice Guidelines. J Am Coll Cardiol. 2019;73(24):3168-3209.  Doris Bihari, MD  Electronically Signed: By: Doris Davis M.D. On: 06/23/2024 21:04     ______________________________________________________________________________________________      Risk Assessment/Calculations           Physical Exam VS:  BP 124/82   Pulse 86   Wt 191 lb 12.8 oz (87 kg)   LMP 07/10/2011   SpO2 97%   BMI 32.92 kg/m        Wt Readings from Last 3 Encounters:  07/13/24 191 lb 12.8 oz (87 kg)  06/28/24 193 lb 12.8 oz (87.9 kg)  12/25/20 190 lb (86.2 kg)    GEN: Well nourished, well developed in no acute distress NECK: No JVD; No carotid bruits CARDIAC: RRR, no murmurs, rubs, gallops RESPIRATORY:  Clear to auscultation without rales, wheezing or rhonchi  ABDOMEN: Soft, non-tender, non-distended EXTREMITIES:  No edema; No deformity   ASSESSMENT AND  PLAN  Abnormal coronary CTA: Coronary CTA showed mild to moderate disease in OM and severe disease in mid RCA with positive FFR.  Benefit and risk of cardiac catheterization has been discussed with the patient who is willing to proceed.  She has significant family history of CAD as well.  Palpitation: Pending 2-week heart monitor that was ordered during the last office visit  Hypertension: Blood pressure stable  Hyperlipidemia: On rosuvastatin      Informed Consent   Shared Decision Making/Informed Consent The risks [stroke (1 in 1000), death (1 in 1000), kidney failure [usually temporary] (1 in 500), bleeding (1 in 200), allergic reaction [possibly serious] (1 in 200)], benefits (diagnostic support and management of coronary artery disease) and alternatives of a cardiac catheterization were discussed in detail with Ms. Melvin and she is willing to proceed.     Dispo: Follow-up in December as previously scheduled.  Signed, Doris Ford, PA

## 2024-07-13 NOTE — Patient Instructions (Addendum)
 Medication Instructions:  Start Metoprolol Tartrate 12.5 mg twice daily   *If you need a refill on your cardiac medications before your next appointment, please call your pharmacy*  Lab Work: BMET & CBC   Testing/Procedures: Your physician has requested that you have a cardiac catheterization. Cardiac catheterization is used to diagnose and/or treat various heart conditions. Doctors may recommend this procedure for a number of different reasons. The most common reason is to evaluate chest pain. Chest pain can be a symptom of coronary artery disease (CAD), and cardiac catheterization can show whether plaque is narrowing or blocking your heart's arteries. This procedure is also used to evaluate the valves, as well as measure the blood flow and oxygen levels in different parts of your heart. For further information please visit [email or web address] Please follow instruction sheet, as given.   Follow-Up: At [facility], you and your health needs are our priority.  As part of our continuing mission to provide you with exceptional heart care, our providers are all part of one team.  This team includes your primary Cardiologist (physician) and Advanced Practice Providers or APPs (Physician Assistants and Nurse Practitioners) who all work together to provide you with the care you need, when you need it.  Your next appointment:    Keep follow with Hao Meng PA    We recommend signing up for the patient portal called MyChart.  Sign up information is provided on this After Visit Summary.  MyChart is used to connect with patients for Virtual Visits (Telemedicine).  Patients are able to view lab/test results, encounter notes, upcoming appointments, etc.  Non-urgent messages can be sent to your provider as well.   To learn more about what you can do with MyChart, go to [email or web address].   Other Instructions  [facility] A DEPT OF [facility] AT  [address] A DEPT OF THE [place] [address] [place] [address] Dept: [phone] Loc: [phone]  Doris Davis  07/13/2024  You are scheduled for a Cardiac Catheterization on Tuesday, November 11 with Dr. Alm Clay.  1. Please arrive at the [facility] (Main Entrance A) at [address] [facility]: [address] [place], [place] [address] at 8:30 AM (This time is 2 hour(s) before your procedure to ensure your preparation).   Free valet parking service is available. You will check in at ADMITTING. The support person will be asked to wait in the waiting room.  It is OK to have someone drop you off and come back when you are ready to be discharged.    Special note: Every effort is made to have your procedure done on time. Please understand that emergencies sometimes delay scheduled procedures.  2. Diet: see clear liquid hydration instructions below   3. Hydration: You need to be well hydrated before your procedure. On November 11, you may drink approved liquids (see below) until 2 hours before the procedure, with 16 oz of water as your last intake.   List of approved liquids water, clear juice, clear tea, black coffee, fruit juices, non-citric and without pulp, carbonated beverages, Gatorade, Kool -Aid, plain Jello-O and plain ice popsicles.  4. Labs:BMET & CBC  5. Medication instructions in preparation for your procedure:   Contrast Allergy: No   Current Outpatient Medications (Endocrine & Metabolic):    levothyroxine  (SYNTHROID ) 112 MCG tablet, Take 112 mcg by mouth daily before breakfast.   liothyronine  (CYTOMEL ) 5 MCG tablet, Take 5  mcg by mouth daily.   levothyroxine  (SYNTHROID ) 100 MCG tablet, Take 100 mcg by mouth daily.  Current Outpatient Medications (Cardiovascular):    amLODipine (NORVASC) 10 MG tablet, Take 10 mg by mouth daily.   losartan -hydrochlorothiazide  (HYZAAR) 100-25 MG tablet, Take 1 tablet by mouth daily.   metoprolol tartrate (LOPRESSOR)  50 MG tablet, Take 1 tablet 2 hours prior to cardiac ct   rosuvastatin (CRESTOR) 20 MG tablet, Take 20 mg by mouth daily.   losartan -hydrochlorothiazide  (HYZAAR) 100-12.5 MG tablet, Take 1 tablet by mouth daily.   Current Outpatient Medications (Respiratory):    montelukast (SINGULAIR) 10 MG tablet, Take 10 mg by mouth as needed.   fluticasone (FLONASE) 50 MCG/ACT nasal spray, Place 1-2 sprays into both nostrils daily as needed for allergies or rhinitis.  Current Outpatient Medications (Analgesics):    acetaminophen  (TYLENOL ) 325 MG tablet, Take 650 mg by mouth every 6 (six) hours as needed for mild pain, fever or headache.   ibuprofen  (ADVIL ) 800 MG tablet, Take 1 tablet (800 mg total) by mouth 3 (three) times daily.   meloxicam (MOBIC) 7.5 MG tablet, Take 7.5 mg by mouth daily as needed. (Patient not taking: Reported on 07/13/2024)   oxyCODONE -acetaminophen  (PERCOCET) 5-325 MG tablet, Take 1 tablet by mouth every 4 (four) hours as needed for severe pain.   Current Outpatient Medications (Other):    ALPRAZolam  (XANAX ) 0.25 MG tablet, Take 0.25 mg by mouth as needed.   buPROPion  (WELLBUTRIN  XL) 300 MG 24 hr tablet, Take 300 mg by mouth daily.   Multiple Vitamins-Minerals (ONE DAILY FOR WOMEN 50+ ADV) TABS, Take 1 tablet by mouth daily at 6 (six) AM.   Multiple Vitamin (MULTIVITAMIN) capsule, Take 1 capsule by mouth daily. *For reference purposes while preparing patient instructions.   Delete this med list prior to printing instructions for patient.*    On the morning of your procedure, take your Aspirin  81 mg and any morning medicines NOT listed above.  You may use sips of water.  6. Plan to go home the same day, you will only stay overnight if medically necessary. 7. Bring a current list of your medications and current insurance cards. 8. You MUST have a responsible person to drive you home. 9. Someone MUST be with you the first 24 hours after you arrive home or your discharge will be  delayed. 10. Please wear clothes that are easy to get on and off and wear slip-on shoes.  Thank you for allowing us  to care for you!   -- [facility]

## 2024-07-13 NOTE — Progress Notes (Unsigned)
  Cardiology Office Note   Date:  07/13/2024  ID:  Doris Davis, DOB [birth year], MRN [id number] PCP: Leonel Cole, MD  [facility] Providers Cardiologist:  [facility] - will set up with Dr. Anner  History of Present Illness Doris Davis is a 53 y.o. female with past medical history of hypertension, hyperlipidemia, anxiety, ADHD, OSA and hypothyroidism but no cardiac history.  She take losartan -HCTZ, amlodipine and levothyroxine  at home.  She was recently seen by her PCP and a complaint of nocturnal leg burning sensation when she lay down and also fluttering sensation in her chest and back about once a week.  She also mentioned a blood pressure jumped up to 200/140 during stressful episode.  During physical exam, she was noted to have a faint heart murmur.  She is caring for her father who has frequent hospitalization due to heart failure.  Blood work obtained on 06/17/2024 showed a low iron level, normal ferritin level and normal transferrin.  Hemoglobin A1c 5.7.  Cholesterol 250, triglyceride 233, LDL 153, HDL 55.  Creatinine 0.65, normal electrolyte and liver enzyme.  Microalbumin to creatinine ratio normal.  Hemoglobin 10.8, hematocrit 32.6, white blood cell count 6.7.  TSH 2.54.  Coronary calcium  scoring test obtained on 06/23/2024 showed total calcium  score of 167, 97 percentile for age and sex matched control.  Echocardiogram obtained on 06/23/2024 showed EF 60 to 65%, normal RV, trivial MR. During the last office visit, she had atypical chest pain however I was more concerned about her dyspnea on exertion and fatigue.  She was also having palpitation which I suspected was related to bursts of SVT.  She has significant family history of heart disease.  With her high coronary calcium  score, I ordered a coronary CTA and a ZIO XT heart monitor.  Patient presents today to review her coronary CT.  Coronary CTA came back showing mild to moderate disease in OM and severe disease in mid  RCA with positive FFR.  Risk and benefit of cardiac catheterization has been discussed with the patient.  Her case has also been reviewed with DOD Dr. Jeffrie who is agreeable to proceed with cardiac catheterization.  She has been started on rosuvastatin 20 mg and 81 mg aspirin  by her PCP.  I will add 12.5 mg twice a day of metoprolol to tartrate.  I plan to see the patient back in  4 weeks for follow-up.  ROS: ***  Studies Reviewed      *** Risk Assessment/Calculations {Does this patient have ATRIAL FIBRILLATION?:[id number]}         Physical Exam VS:  BP 124/82   Pulse 86   Wt 191 lb 12.8 oz (87 kg)   LMP 07/10/2011   SpO2 97%   BMI 32.92 kg/m        Wt Readings from Last 3 Encounters:  07/13/24 191 lb 12.8 oz (87 kg)  06/28/24 193 lb 12.8 oz (87.9 kg)  12/25/20 190 lb (86.2 kg)    GEN: Well nourished, well developed in no acute distress NECK: No JVD; No carotid bruits CARDIAC: ***RRR, no murmurs, rubs, gallops RESPIRATORY:  Clear to auscultation without rales, wheezing or rhonchi  ABDOMEN: Soft, non-tender, non-distended EXTREMITIES:  No edema; No deformity   ASSESSMENT AND PLAN ***    {Are you ordering a CV Procedure (e.g. stress test, cath, DCCV, TEE, etc)?   Press F2        :[id number]}  Dispo: ***  Signed, Scot Ford, PA

## 2024-07-13 NOTE — Progress Notes (Signed)
 Ordered CT FFR

## 2024-07-13 NOTE — Telephone Encounter (Signed)
 Pt is returning call to a nurse

## 2024-07-14 ENCOUNTER — Other Ambulatory Visit: Payer: Self-pay | Admitting: Physician Assistant

## 2024-07-14 DIAGNOSIS — Z79899 Other long term (current) drug therapy: Secondary | ICD-10-CM | POA: Diagnosis not present

## 2024-07-15 DIAGNOSIS — R011 Cardiac murmur, unspecified: Secondary | ICD-10-CM | POA: Diagnosis not present

## 2024-07-15 DIAGNOSIS — I498 Other specified cardiac arrhythmias: Secondary | ICD-10-CM | POA: Diagnosis not present

## 2024-07-15 LAB — CBC
Hematocrit: 33 % — ABNORMAL LOW (ref 34.0–46.6)
Hemoglobin: 10.5 g/dL — ABNORMAL LOW (ref 11.1–15.9)
MCH: 26 pg — ABNORMAL LOW (ref 26.6–33.0)
MCHC: 31.8 g/dL (ref 31.5–35.7)
MCV: 82 fL (ref 79–97)
Platelets: 312 x10E3/uL (ref 150–450)
RBC: 4.04 x10E6/uL (ref 3.77–5.28)
RDW: 14.6 % (ref 11.7–15.4)
WBC: 7.3 x10E3/uL (ref 3.4–10.8)

## 2024-07-15 LAB — BASIC METABOLIC PANEL WITH GFR
BUN/Creatinine Ratio: 20 (ref 9–23)
BUN: 16 mg/dL (ref 6–24)
CO2: 26 mmol/L (ref 20–29)
Calcium: 10.2 mg/dL (ref 8.7–10.2)
Chloride: 102 mmol/L (ref 96–106)
Creatinine, Ser: 0.82 mg/dL (ref 0.57–1.00)
Glucose: 88 mg/dL (ref 70–99)
Potassium: 4.1 mmol/L (ref 3.5–5.2)
Sodium: 143 mmol/L (ref 134–144)
eGFR: 84 mL/min/1.73 (ref >59)

## 2024-07-16 ENCOUNTER — Ambulatory Visit: Payer: Self-pay | Admitting: Physician Assistant

## 2024-07-16 NOTE — Progress Notes (Signed)
 Chronic mildly anemic dating back at least 5 years, but red blood cell count stable. Normal renal function and electrolyte. No contraindication to cath.

## 2024-07-19 ENCOUNTER — Telehealth: Payer: Self-pay | Admitting: *Deleted

## 2024-07-19 NOTE — Telephone Encounter (Signed)
 Cardiac Catheterization scheduled at [facility] for: Tuesday July 20, 2024 10:30 AM Arrival time [facility] Main Entrance A at: 8:30 AM  Diet: -Nothing to eat after midnight.  Hydration: -May drink clear liquids until 2 hours before the procedure.  Approved liquids: Water, clear tea, black coffee, fruit juices-non-citric and without pulp,Gatorade, plain Jello/popsicles.   -Please drink 16 oz of water 2 hours before procedure.  Medication instructions: -Hold:  Losartan /hydrochlorothiazide -AM of procedure -Other usual morning medications can be taken including aspirin  81 mg.  Plan to go home the same day, you will only stay overnight if medically necessary.  You must have responsible adult to drive you home.  Someone must be with you the first 24 hours after you arrive home.  Reviewed procedure instructions with patient.

## 2024-07-20 ENCOUNTER — Ambulatory Visit (HOSPITAL_COMMUNITY)
Admission: RE | Admit: 2024-07-20 | Discharge: 2024-07-20 | Disposition: A | Discharge status: Home / Self Care | Attending: Cardiology | Admitting: Cardiology

## 2024-07-20 ENCOUNTER — Other Ambulatory Visit: Payer: Self-pay

## 2024-07-20 ENCOUNTER — Other Ambulatory Visit (HOSPITAL_COMMUNITY): Payer: Self-pay

## 2024-07-20 ENCOUNTER — Encounter (HOSPITAL_COMMUNITY)
Admission: RE | Disposition: A | Discharge status: Home / Self Care | Payer: Self-pay | Source: Home / Self Care | Attending: Cardiology

## 2024-07-20 DIAGNOSIS — Z8249 Family history of ischemic heart disease and other diseases of the circulatory system: Secondary | ICD-10-CM | POA: Diagnosis not present

## 2024-07-20 DIAGNOSIS — E785 Hyperlipidemia, unspecified: Secondary | ICD-10-CM | POA: Diagnosis not present

## 2024-07-20 DIAGNOSIS — R931 Abnormal findings on diagnostic imaging of heart and coronary circulation: Secondary | ICD-10-CM | POA: Diagnosis not present

## 2024-07-20 DIAGNOSIS — I2510 Atherosclerotic heart disease of native coronary artery without angina pectoris: Secondary | ICD-10-CM | POA: Insufficient documentation

## 2024-07-20 DIAGNOSIS — I10 Essential (primary) hypertension: Secondary | ICD-10-CM | POA: Diagnosis not present

## 2024-07-20 DIAGNOSIS — Z79899 Other long term (current) drug therapy: Secondary | ICD-10-CM | POA: Diagnosis not present

## 2024-07-20 DIAGNOSIS — Z955 Presence of coronary angioplasty implant and graft: Secondary | ICD-10-CM

## 2024-07-20 HISTORY — PX: LEFT HEART CATH AND CORONARY ANGIOGRAPHY: CATH118249

## 2024-07-20 HISTORY — PX: CORONARY STENT INTERVENTION: CATH118234

## 2024-07-20 LAB — POCT ACTIVATED CLOTTING TIME
Activated Clotting Time: 268 s
Activated Clotting Time: 302 s

## 2024-07-20 SURGERY — LEFT HEART CATH AND CORONARY ANGIOGRAPHY
Anesthesia: LOCAL

## 2024-07-20 MED ORDER — ONDANSETRON HCL 4 MG/2ML IJ SOLN
INTRAMUSCULAR | Status: AC
  Filled 2024-07-20: qty 2

## 2024-07-20 MED ORDER — CLOPIDOGREL BISULFATE 75 MG PO TABS
75.0000 mg | ORAL_TABLET | Freq: Every day | ORAL | 11 refills | Status: DC
  Filled 2024-07-20: qty 30, 30d supply, fill #0

## 2024-07-20 MED ORDER — MIDAZOLAM HCL (PF) 2 MG/2ML IJ SOLN
INTRAMUSCULAR | Status: DC | PRN
Start: 2024-07-20 — End: 2024-07-20
  Administered 2024-07-20: 1 mg via INTRAVENOUS
  Administered 2024-07-20: 2 mg via INTRAVENOUS

## 2024-07-20 MED ORDER — FREE WATER: 750.0000 mL | Freq: Once | Status: DC

## 2024-07-20 MED ORDER — FENTANYL CITRATE (PF) 100 MCG/2ML IJ SOLN
INTRAMUSCULAR | Status: DC | PRN
  Administered 2024-07-20 (×2): 25 ug via INTRAVENOUS

## 2024-07-20 MED ORDER — MORPHINE SULFATE (PF) 2 MG/ML IV SOLN: 1.0000 mg | INTRAVENOUS | Status: DC | PRN

## 2024-07-20 MED ORDER — MIDAZOLAM HCL 2 MG/2ML IJ SOLN
INTRAMUSCULAR | Status: AC
  Filled 2024-07-20: qty 2

## 2024-07-20 MED ORDER — ASPIRIN 81 MG PO CHEW: 81.0000 mg | CHEWABLE_TABLET | ORAL | Status: DC

## 2024-07-20 MED ORDER — ONDANSETRON HCL 4 MG/2ML IJ SOLN: 4.0000 mg | Freq: Four times a day (QID) | INTRAMUSCULAR | Status: DC | PRN

## 2024-07-20 MED ORDER — HYDRALAZINE HCL 20 MG/ML IJ SOLN
10.0000 mg | INTRAMUSCULAR | Status: DC | PRN
Start: 2024-07-20 — End: 2024-07-20

## 2024-07-20 MED ORDER — LABETALOL HCL 5 MG/ML IV SOLN: 10.0000 mg | INTRAVENOUS | Status: DC | PRN

## 2024-07-20 MED ORDER — CLOPIDOGREL BISULFATE 300 MG PO TABS
ORAL_TABLET | ORAL | Status: DC | PRN
  Administered 2024-07-20: 600 mg via ORAL

## 2024-07-20 MED ORDER — VERAPAMIL HCL 2.5 MG/ML IV SOLN
INTRAVENOUS | Status: DC | PRN
  Administered 2024-07-20: 10 mL via INTRA_ARTERIAL

## 2024-07-20 MED ORDER — LIDOCAINE HCL (PF) 1 % IJ SOLN
INTRAMUSCULAR | Status: DC | PRN
Start: 2024-07-20 — End: 2024-07-20
  Administered 2024-07-20: 5 mL

## 2024-07-20 MED ORDER — HEPARIN SODIUM (PORCINE) 1000 UNIT/ML IJ SOLN
INTRAMUSCULAR | Status: DC | PRN
  Administered 2024-07-20: 2000 [IU] via INTRAVENOUS
  Administered 2024-07-20: 4000 [IU] via INTRAVENOUS
  Administered 2024-07-20: 5000 [IU] via INTRAVENOUS

## 2024-07-20 MED ORDER — HEPARIN SODIUM (PORCINE) 1000 UNIT/ML IJ SOLN
INTRAMUSCULAR | Status: AC
  Filled 2024-07-20: qty 10

## 2024-07-20 MED ORDER — SODIUM CHLORIDE 0.9% FLUSH: 3.0000 mL | INTRAVENOUS | Status: DC | PRN

## 2024-07-20 MED ORDER — ONDANSETRON HCL 4 MG/2ML IJ SOLN
INTRAMUSCULAR | Status: DC | PRN
  Administered 2024-07-20: 4 mg via INTRAVENOUS

## 2024-07-20 MED ORDER — IOHEXOL 350 MG/ML SOLN
INTRAVENOUS | Status: DC | PRN
  Administered 2024-07-20: 135 mL

## 2024-07-20 MED ORDER — FAMOTIDINE IN NACL 20-0.9 MG/50ML-% IV SOLN
INTRAVENOUS | Status: AC | PRN
  Administered 2024-07-20: 20 mg via INTRAVENOUS

## 2024-07-20 MED ORDER — NITROGLYCERIN 0.4 MG SL SUBL
0.4000 mg | SUBLINGUAL_TABLET | SUBLINGUAL | 3 refills | Status: DC | PRN
  Filled 2024-07-20: qty 100, 1d supply, fill #0

## 2024-07-20 MED ORDER — NITROGLYCERIN 0.4 MG SL SUBL
0.4000 mg | SUBLINGUAL_TABLET | SUBLINGUAL | 2 refills | Status: AC | PRN
  Filled 2024-07-20: qty 25, 7d supply, fill #0

## 2024-07-20 MED ORDER — ACETAMINOPHEN 325 MG PO TABS: 650.0000 mg | ORAL_TABLET | ORAL | Status: DC | PRN

## 2024-07-20 MED ORDER — SODIUM CHLORIDE 0.9 % IV SOLN: 250.0000 mL | INTRAVENOUS | Status: DC | PRN

## 2024-07-20 MED ORDER — FREE WATER: 250.0000 mL | Freq: Once | Status: DC

## 2024-07-20 MED ORDER — NITROGLYCERIN 1 MG/10 ML FOR IR/CATH LAB
INTRA_ARTERIAL | Status: DC | PRN
  Administered 2024-07-20: 200 ug via INTRACORONARY

## 2024-07-20 MED ORDER — CLOPIDOGREL BISULFATE 300 MG PO TABS
ORAL_TABLET | ORAL | Status: AC
  Filled 2024-07-20: qty 2

## 2024-07-20 MED ORDER — NITROGLYCERIN 1 MG/10 ML FOR IR/CATH LAB
INTRA_ARTERIAL | Status: AC
  Filled 2024-07-20: qty 10

## 2024-07-20 MED ORDER — LIDOCAINE HCL (PF) 1 % IJ SOLN
INTRAMUSCULAR | Status: AC
  Filled 2024-07-20: qty 30

## 2024-07-20 MED ORDER — CLOPIDOGREL BISULFATE 75 MG PO TABS: 75.0000 mg | ORAL_TABLET | Freq: Every day | ORAL | Status: DC

## 2024-07-20 MED ORDER — FENTANYL CITRATE (PF) 100 MCG/2ML IJ SOLN
INTRAMUSCULAR | Status: AC
  Filled 2024-07-20: qty 2

## 2024-07-20 MED ORDER — HEPARIN (PORCINE) IN NACL 1000-0.9 UT/500ML-% IV SOLN
INTRAVENOUS | Status: DC | PRN
  Administered 2024-07-20 (×2): 500 mL

## 2024-07-20 MED ORDER — VERAPAMIL HCL 2.5 MG/ML IV SOLN
INTRAVENOUS | Status: AC
  Filled 2024-07-20: qty 2

## 2024-07-20 MED ORDER — SODIUM CHLORIDE 0.9% FLUSH: 3.0000 mL | Freq: Two times a day (BID) | INTRAVENOUS | Status: DC

## 2024-07-20 MED ORDER — FAMOTIDINE IN NACL 20-0.9 MG/50ML-% IV SOLN
INTRAVENOUS | Status: AC
  Filled 2024-07-20: qty 50

## 2024-07-20 SURGICAL SUPPLY — 21 items
BALLOON EMERGE MR 3.5X12 (BALLOONS) IMPLANT
BALLOON SAPPHIRE NC24 4.5X12 (BALLOONS) IMPLANT
BALLOON SCOREFLEX 4.0X15 (BALLOONS) IMPLANT
BALLOON WOLVERINE 4.00X15 (BALLOONS) IMPLANT
BALLOON ~~LOC~~ EMERGE MR 4.0X15 (BALLOONS) IMPLANT
CATH INFINITI 5 FR JL3.5 (CATHETERS) IMPLANT
CATH INFINITI AMBI 5FR TG (CATHETERS) IMPLANT
CATH LAUNCHER 5F JR4 (CATHETERS) IMPLANT
CATH VISTA GUIDE 6FR JR4 ECOPK (CATHETERS) IMPLANT
DEVICE RAD TR BAND REGULAR (VASCULAR PRODUCTS) IMPLANT
ELECT DEFIB PAD ADLT CADENCE (PAD) IMPLANT
GLIDESHEATH SLEND SS 6F .021 (SHEATH) IMPLANT
GUIDEWIRE INQWIRE 1.5J.035X260 (WIRE) IMPLANT
KIT ENCORE 26 ADVANTAGE (KITS) IMPLANT
PACK CARDIAC CATHETERIZATION (CUSTOM PROCEDURE TRAY) ×1 IMPLANT
SET ATX-X65L (MISCELLANEOUS) IMPLANT
SHEATH PROBE COVER 6X72 (BAG) IMPLANT
STATION PROTECTION PRESSURIZED (MISCELLANEOUS) IMPLANT
STENT SYNERGY XD 4.0X20 (Permanent Stent) IMPLANT
WIRE ASAHI PROWATER 180CM (WIRE) IMPLANT
WIRE RUNTHROUGH .014X180CM (WIRE) IMPLANT

## 2024-07-20 NOTE — Discharge Summary (Cosign Needed Addendum)
 Discharge Summary for Same Day PCI   Patient ID: Doris Davis MRN: [id number]; DOB: May 28, 1971  Admit date: 07/20/2024 Discharge date: 07/20/2024  Primary Care Provider: Leonel Cole, MD  Primary Cardiologist: None [facility] Primary Electrophysiologist:  None   Discharge Diagnoses    Active Problems:   CAD (coronary artery disease)    Diagnostic Studies/Procedures    Cardiac Catheterization 07/20/2024:  Prox RCA lesion is 30% stenosed.   CULPRIT all Lesion: Mid RCA to Dist RCA lesion is 75% stenosed.   After scoring balloon and high-pressure [place] balloon angioplasty, A drug-eluting stent was successfully placed using a STENT SYNERGY XD 4.0X20- > deployed to 4.3 mm and postdilated with a 4.5 mm balloon to low atmospheres leading to full expansion.SABRA  Post intervention, there is a 0% residual stenosis.  TIMI-3 flow maintained   Dist RCA lesion is 30% stenosed.   LV end diastolic pressure is normal. There is no aortic valve stenosis.  -LV gram not performed due to significant ectopy.   Diagnostic  Dominance: Right                                                   Intervention  Single-vessel CAD with 75% mid-distal RCA stenosis (heavily fibrotic)  Successful PTCA with ScoreFlex and high-pressure [place] balloon followed by DES PCI (Synergy XD 4.0 mm x 20 mm deployed to 4.3 mm). Modest 30% distal RCA stenosis prior to bifurcation. Relatively normal left coronary system. Normal LVEDP     PLAN: Same-day discharge-6 months uninterrupted DAPT.  See Recommendations.  _____________   History of Present Illness     Doris Davis is a 53 y.o. female with hypertension, hyperlipidemia, anxiety, ADHD, OSA and hypothyroidism.   Patient recently [facility] with [facility]. At that time reported atypical chest pain however there were more concerning features of dyspnea on exertion and fatigue. Work up revealed abnormal coronary CTA demonstrating flow-limiting stenosis in  mid RCA.    Cardiac catheterization was arranged for further evaluation.  Hospital Course     The patient underwent cardiac cath as noted above with single-vessel CAD with 75% mid to distal RCA stenosis. Successful PTCA with ScoreFlex and high-pressure  balloon followed by DES.    Plan for DAPT with ASA/plavix for at least 6 months then an additional 6 months of Plavix monotherapy. The patient was seen by cardiac rehab while in short stay. There were no observed complications post cath. Radial cath site was re-evaluated prior to discharge and found to be stable without any complications. Instructions/precautions regarding cath site care were given prior to discharge.  Doris Davis was seen by Dr. Anner and determined stable for discharge home. Follow up with our office has been arranged.   _____________  Cath/PCI Registry Performance & Quality Measures: Aspirin  prescribed? - Yes ADP Receptor Inhibitor (Plavix/Clopidogrel, Brilinta/Ticagrelor or Effient/Prasugrel) prescribed (includes medically managed patients)? - Yes High Intensity Statin (Lipitor 40-80mg  or Crestor 20-40mg ) prescribed? - Yes For EF <40%, was ACEI/ARB prescribed? - Not Applicable (EF >/= 40%) For EF <40%, Aldosterone Antagonist (Spironolactone or Eplerenone) prescribed? - Not Applicable (EF >/= 40%) Cardiac Rehab Phase II ordered (Included Medically managed Patients)? - Yes  _____________   Discharge Vitals Blood pressure 118/80, pulse 79, temperature 98.4 F (36.9 C), temperature source Oral, resp. rate 20, height 5' 3 (  1.6 m), weight 86.2 kg, last menstrual period 07/10/2011, SpO2 96%.  Filed Weights   07/20/24 0858  Weight: 86.2 kg    Last Labs & Radiologic Studies    CBC No results for input(s): WBC, NEUTROABS, HGB, HCT, MCV, PLT in the last 72 hours. Basic Metabolic Panel No results for input(s): NA, K, CL, CO2, GLUCOSE, BUN, CREATININE, CALCIUM , MG, PHOS in the last  72 hours. Liver Function Tests No results for input(s): AST, ALT, ALKPHOS, BILITOT, PROT, ALBUMIN in the last 72 hours. No results for input(s): LIPASE, AMYLASE in the last 72 hours. High Sensitivity Troponin:   No results for input(s): TROPONINIHS in the last 720 hours.  BNP Invalid input(s): POCBNP D-Dimer No results for input(s): DDIMER in the last 72 hours. Hemoglobin A1C No results for input(s): HGBA1C in the last 72 hours. Fasting Lipid Panel No results for input(s): CHOL, HDL, LDLCALC, TRIG, CHOLHDL, LDLDIRECT in the last 72 hours. Thyroid  Function Tests No results for input(s): TSH, T4TOTAL, T3FREE, THYROIDAB in the last 72 hours.  Invalid input(s): FREET3 _____________  CARDIAC CATHETERIZATION Result Date: 07/20/2024 Images from the original result were not included.   Prox RCA lesion is 30% stenosed.   CULPRIT all Lesion: Mid RCA to Dist RCA lesion is 75% stenosed.   After scoring balloon and high-pressure [place] balloon angioplasty, A drug-eluting stent was successfully placed using a STENT SYNERGY XD 4.0X20- > deployed to 4.3 mm and postdilated with a 4.5 mm balloon to low atmospheres leading to full expansion.SABRA  Post intervention, there is a 0% residual stenosis.  TIMI-3 flow maintained   Dist RCA lesion is 30% stenosed.   LV end diastolic pressure is normal. There is no aortic valve stenosis.  -LV gram not performed due to significant ectopy. Diagnostic  Dominance: Right     Intervention Single-vessel CAD with 75% mid-distal RCA stenosis (heavily fibrotic) Successful PTCA with ScoreFlex and high-pressure [place] balloon followed by DES PCI (Synergy XD 4.0 mm x 20 mm deployed to 4.3 mm). Modest 30% distal RCA stenosis prior to bifurcation. Relatively normal left coronary system. Normal LVEDP PLAN: Same-day discharge-6 months uninterrupted DAPT.  See Recommendations.   CT CORONARY MORPH W/CTA COR W/SCORE W/CA W/CM &/OR WO/CM Addendum Date:  07/18/2024 ADDENDUM REPORT: 07/18/2024 17:40 EXAM: OVER-READ INTERPRETATION  CT CHEST The following report is an over-read performed by radiologist Dr. Andrea Gasman of [facility] on 07/18/2024. This over-read does not include interpretation of cardiac or coronary anatomy or pathology. The coronary CTA interpretation by the cardiologist is attached. COMPARISON:  Cardiac CT 06/23/2024 FINDINGS: Vascular: Aortic atherosclerosis. The included aorta is normal in caliber. Mediastinum/nodes: No adenopathy or mass. Unremarkable esophagus. Lungs: No focal airspace disease. No pulmonary nodule. No pleural fluid. The included airways are patent. Upper abdomen: No acute or unexpected findings. Musculoskeletal: There are no acute or suspicious osseous abnormalities. IMPRESSION: Aortic Atherosclerosis (ICD10-I70.0). Electronically Signed   By: Andrea Gasman M.D.   On: 07/18/2024 17:40   Result Date: 07/18/2024 HISTORY: chest pain EXAM: Cardiac/Coronary CT TECHNIQUE: The patient was scanned on a [employer]. PROTOCOL: A 120 kV prospective scan was triggered in the descending thoracic aorta at 111 HU's. Axial non-contrast 3 mm slices were carried out through the heart. The data set was analyzed on a dedicated work station and scored using the Agatston method. Gantry rotation speed was 250 msecs and collimation was 0.6 mm. Heart rate was optimized medically and sl NTG was given. The 3D data set was reconstructed in  5% intervals of the 35-75 % of the R-R cycle. Systolic and diastolic phases were analyzed on a dedicated work station using MPR, MIP and VRT modes. The patient received 100mL OMNIPAQUE  IOHEXOL  350 MG/ML SOLN of contrast. FINDINGS: Image Quality: Good Coronary calcium  score: The patient's coronary artery calcium  score is 148, which places the patient in the 97th percentile. Coronary arteries: Right dominant coronary system. Normal coronary origins. Left Main: Normal caliber vessel. No  significant plaque or stenosis. Left Anterior Descending: Normal caliber vessel. The proximal to distal LAD has mild scattered, mixed, calcified and non-calcified plaque resulting in <25% stenosis. Diagonal branches: Normal caliber vessels without significant plaque or stenosis proximally. The distal aspects of these vessels are too small to characterize. Left Circumflex Artery: Normal caliber vessel that becomes diminutive distally. There is minimal calcified plaque in the proximal LCx resulting in <25% stenosis. Obtuse Marginal branches: OM1 has a high origin off of the LCx and is without significant plaque or stenosis. OM2 is a large vessel that has a segment of luminal narrowing from either non-calcified plaque or artifact resulting in 25-49% stenosis. The remaining vessel is largely free of disease. Right Coronary Artery: Normal caliber vessel, gives rise to PDA. The mid-RCA has a severe stenosis from predominately non-calcified plaque resulting in 70-99% stenosis. The proximal vessel has minimal calcified plaque. The distal vessel is largely free of disease. EXTRA-CORONARY FINDINGS: EXTRA-CORONARY FINDINGS Aorta: Normal size, 31 mm x 31 mm at the mid ascending aorta (level of the PA bifurcation) measured double oblique. There is mild aortic calcifications in the descending thoracic aorta. No dissection seen in visualized portions of the aorta. Normal size of the pulmonary artery Aortic Valve: Trileaflet without calcifications. Normal pulmonary vein drainage into the left atrium. Normal left atrial appendage without a thrombus. Normal appearance of the pericardium. Non-cardiac findings will be assessed and reported in a separate document by radiology. IMPRESSION: 1. There is evidence of mixed calcified and non-calcified coronary artery disease in all 3 major epicardial coronary arteries. The most notable disease is seen in the mid RCA with a 70-99% stenosis resulting from non-calcified plaque. The second OM  branch has a proximal stenosis of 25-49%, but it is unclear if this is a true lesion from soft plaque or artifact. CADRADS = 4A. CT FFR will be performed. 2. Coronary calcium  score of 148. This was 97th percentile for age-, sex-, and race- matched controls. 3. Total plaque volume = 124 mm3. 4. Normal coronary origin with right dominance. INTERPRETATION: CAD-RADS 4: Severe stenosis. (70-99% or > 50% left main). Cardiac catheterization or CT FFR is recommended. Consider symptom-guided anti-ischemic pharmacotherapy as well as risk factor modification per guideline directed care. Invasive coronary angiography recommended with revascularization per published guideline statements. Electronically Signed: By: Georganna Archer On: 07/13/2024 11:02   CT CORONARY FFR DATA PREP & FLUID ANALYSIS Result Date: 07/13/2024 EXAM: CT FFR ANALYSIS CLINICAL DATA:  chest pain FINDINGS: FFRct analysis was performed on the original cardiac CT angiogram dataset. Diagrammatic representation of the FFRct analysis is provided in a separate PDF document in PACS. This dictation was created using the PDF document and an interactive 3D model of the results. 3D model is not available in the EMR/PACS. Normal FFR range is >0.80. 1. LAD: No significant stenosis. Proximal FFR = 0.99 , Mid FFR = 0.97 , Distal FFR =0.90 2. LCX & OM2 Branch: No significant stenosis. Proximal FFR = 0.99, Proximal OM FFR = 0.97 3. RCA: The mid-RCA has evidence of flow  limiting stenosis. Proximal FFR = 0.98 , Mid FFR = 0.74 IMPRESSION: 1. There is evidence of flow limiting stenosis in the mid-RCA vessel. 2. Recommend left heart catheterization for further evaluation and treatment. Electronically Signed   By: Georganna Archer   On: 07/13/2024 11:06   CT CARDIAC SCORING (SELF PAY ONLY) Addendum Date: 06/30/2024 ADDENDUM REPORT: 06/30/2024 01:31 EXAM: OVER-READ INTERPRETATION  CT CHEST The following report is an over-read performed by radiologist Dr. Oneil Devonshire of  [facility] on 06/30/2024. This over-read does not include interpretation of cardiac or coronary anatomy or pathology. The coronary calcium  score interpretation by the cardiologist is attached. COMPARISON:  12/25/2020 FINDINGS: Cardiovascular: Atherosclerotic calcifications of the aorta are seen without aneurysmal dilatation. Mediastinum/Nodes: There are no enlarged lymph nodes within the visualized mediastinum. Lungs/Pleura: There is no pleural effusion. The visualized lungs appear clear. Upper abdomen: No significant findings in the visualized upper abdomen. Musculoskeletal/Chest wall: No chest wall mass or suspicious osseous findings within the visualized chest. IMPRESSION: Aortic Atherosclerosis (ICD10-I70.0). Electronically Signed   By: Oneil Devonshire M.D.   On: 06/30/2024 01:31   Result Date: 06/30/2024 CLINICAL DATA:  Cardiovascular Disease Risk stratification EXAM: Coronary Calcium  Score TECHNIQUE: A gated, non-contrast computed tomography scan of the heart was performed using 3mm slice thickness. Axial images were analyzed on a dedicated workstation. Calcium  scoring of the coronary arteries was performed using the Agatston method. FINDINGS: Coronary arteries: Normal origins. Coronary Calcium  Score: Left main: 0 Left anterior descending artery: 125 Left circumflex artery: 40.1 Right coronary artery: 2.8 Total: 167 Percentile: 97th Pericardium: Normal. Ascending Aorta: Normal caliber. Non-cardiac: See separate report from [facility]. IMPRESSION: Coronary calcium  score of 167. This was 97th percentile for age-, race-, and sex-matched controls. RECOMMENDATIONS: Coronary artery calcium  (CAC) score is a strong predictor of incident coronary heart disease (CHD) and provides predictive information beyond traditional risk factors. CAC scoring is reasonable to use in the decision to withhold, postpone, or initiate statin therapy in intermediate-risk or selected borderline-risk asymptomatic  adults (age 53-75 years and LDL-C >=70 to <190 mg/dL) who do not have diabetes or established atherosclerotic cardiovascular disease (ASCVD).* In intermediate-risk (10-year ASCVD risk >=7.5% to <20%) adults or selected borderline-risk (10-year ASCVD risk >=5% to <7.5%) adults in whom a CAC score is measured for the purpose of making a treatment decision the following recommendations have been made: If CAC=0, it is reasonable to withhold statin therapy and reassess in 5 to 10 years, as long as higher risk conditions are absent (diabetes mellitus, family history of premature CHD in first degree relatives (males <55 years; females <65 years), cigarette smoking, or LDL >=190 mg/dL). If CAC is 1 to 99, it is reasonable to initiate statin therapy for patients >=63 years of age. If CAC is >=100 or >=75th percentile, it is reasonable to initiate statin therapy at any age. Cardiology referral should be considered for patients with CAC scores >=400 or >=75th percentile. *2018 AHA/ACC/AACVPR/AAPA/ABC/ACPM/ADA/AGS/APhA/ASPC/NLA/PCNA Guideline on the Management of Blood Cholesterol: A Report of the American College of Cardiology/American Heart Association Task Force on Clinical Practice Guidelines. J Am Coll Cardiol. 2019;73(24):3168-3209. Wilbert Bihari, MD Electronically Signed: By: Wilbert Bihari M.D. On: 06/23/2024 21:04   ECHOCARDIOGRAM COMPLETE Result Date: 06/23/2024    ECHOCARDIOGRAM REPORT   Patient Name:   PUNAM BROUSSARD Date of Exam: 06/23/2024 Medical Rec #:  [id number]      Height:       63.0 in Accession #:    [id number]  Weight:       190.0 lb Date of Birth:  [birth year]      BSA:          1.892 m Patient Age:    53 years       BP:           149/90 mmHg Patient Gender: F              HR:           90 bpm. Exam Location:  [address] Procedure: 2D Echo, Cardiac Doppler and Color Doppler (Both Spectral and Color            Flow Doppler were utilized during procedure). Indications:    R01.1 Murmur  History:         Patient has no prior history of Echocardiogram examinations.                 Signs/Symptoms:Murmur; Risk Factors:Hypertension.  Sonographer:    Elsie Bohr RDCS Referring Phys: [id number] ELI HAMMER IMPRESSIONS  1. Left ventricular ejection fraction, by estimation, is 60 to 65%. The left ventricle has normal function. The left ventricle has no regional wall motion abnormalities. Left ventricular diastolic parameters were normal.  2. Right ventricular systolic function is normal. The right ventricular size is normal. Tricuspid regurgitation signal is inadequate for assessing PA pressure.  3. The mitral valve is abnormal. Trivial mitral valve regurgitation. No evidence of mitral stenosis.  4. The aortic valve is tricuspid. Aortic valve regurgitation is not visualized. No aortic stenosis is present.  5. The inferior vena cava is normal in size with greater than 50% respiratory variability, suggesting right atrial pressure of 3 mmHg. Comparison(s): No prior Echocardiogram. Conclusion(s)/Recommendation(s): Otherwise normal echocardiogram, with minor abnormalities described in the report. FINDINGS  Left Ventricle: Left ventricular ejection fraction, by estimation, is 60 to 65%. The left ventricle has normal function. The left ventricle has no regional wall motion abnormalities. The left ventricular internal cavity size was normal in size. There is  no left ventricular hypertrophy. Left ventricular diastolic parameters were normal. Right Ventricle: The right ventricular size is normal. No increase in right ventricular wall thickness. Right ventricular systolic function is normal. Tricuspid regurgitation signal is inadequate for assessing PA pressure. Left Atrium: Left atrial size was normal in size. Right Atrium: Right atrial size was normal in size. Pericardium: There is no evidence of pericardial effusion. Mitral Valve: The mitral valve is abnormal. There is mild thickening of the mitral valve leaflet(s). Trivial  mitral valve regurgitation. No evidence of mitral valve stenosis. Tricuspid Valve: The tricuspid valve is normal in structure. Tricuspid valve regurgitation is not demonstrated. No evidence of tricuspid stenosis. Aortic Valve: The aortic valve is tricuspid. Aortic valve regurgitation is not visualized. No aortic stenosis is present. Pulmonic Valve: The pulmonic valve was normal in structure. Pulmonic valve regurgitation is trivial. No evidence of pulmonic stenosis. Aorta: The aortic root and ascending aorta are structurally normal, with no evidence of dilitation. Venous: The inferior vena cava is normal in size with greater than 50% respiratory variability, suggesting right atrial pressure of 3 mmHg. IAS/Shunts: No atrial level shunt detected by color flow Doppler.  LEFT VENTRICLE PLAX 2D LVIDd:         3.70 cm   Diastology LVIDs:         2.40 cm   LV e' medial:    6.85 cm/s LV PW:         1.00 cm   LV E/e'  medial:  9.0 LV IVS:        1.60 cm   LV e' lateral:   13.80 cm/s LVOT diam:     1.90 cm   LV E/e' lateral: 4.5 LV SV:         54 LV SV Index:   29 LVOT Area:     2.84 cm  RIGHT VENTRICLE             IVC RV S prime:     13.50 cm/s  IVC diam: 1.40 cm TAPSE (M-mode): 1.7 cm LEFT ATRIUM             Index        RIGHT ATRIUM           Index LA diam:        3.70 cm 1.96 cm/m   RA Pressure: 3.00 mmHg LA Vol (A2C):   36.7 ml 19.40 ml/m  RA Area:     10.50 cm LA Vol (A4C):   32.1 ml 16.97 ml/m  RA Volume:   25.50 ml  13.48 ml/m LA Biplane Vol: 34.4 ml 18.18 ml/m  AORTIC VALVE LVOT Vmax:   99.80 cm/s LVOT Vmean:  69.800 cm/s LVOT VTI:    0.192 m  AORTA Ao Root diam: 3.40 cm Ao Asc diam:  3.30 cm MITRAL VALVE               TRICUSPID VALVE MV Area (PHT): 3.58 cm    Estimated RAP:  3.00 mmHg MV Decel Time: 212 msec MV E velocity: 61.80 cm/s  SHUNTS MV A velocity: 83.80 cm/s  Systemic VTI:  0.19 m MV E/A ratio:  0.74        Systemic Diam: 1.90 cm Emeline Calender Electronically signed by Emeline Calender Signature Date/Time:  06/23/2024/5:33:00 PM    Final     Disposition   Pt is being discharged home today in good condition.  Follow-up Plans & Appointments   Hao 12/2  Discharge Instructions     Amb Referral to Cardiac Rehabilitation   Complete by: As directed    Diagnosis: Coronary Stents   After initial evaluation and assessments completed: Virtual Based Care may be provided alone or in conjunction with Phase 2 Cardiac Rehab based on patient barriers.: Yes   Intensive Cardiac Rehabilitation (ICR) [facility] location only OR Traditional Cardiac Rehabilitation (TCR) *If criteria for ICR are not met will enroll in TCR [facility] only): Yes   Diet - low sodium heart healthy   Complete by: As directed    Discharge instructions   Complete by: As directed    Radial Site Care Refer to this sheet in the next few weeks. These instructions provide you with information on caring for yourself after your procedure. Your caregiver may also give you more specific instructions. Your treatment has been planned according to current medical practices, but problems sometimes occur. Call your caregiver if you have any problems or questions after your procedure. HOME CARE INSTRUCTIONS You may shower the day after the procedure. Remove the bandage (dressing) and gently wash the site with plain soap and water. Gently pat the site dry.  Do not apply powder or lotion to the site.  Do not submerge the affected site in water for 3 to 5 days.  Inspect the site at least twice daily.  Do not flex or bend the affected arm for 24 hours.  No lifting over 5 pounds (2.3 kg) for 5 days after your procedure.  Do not drive  home if you are discharged the same day of the procedure. Have someone else drive you.  You may drive 24 hours after the procedure unless otherwise instructed by your caregiver.  What to expect: Any bruising will usually fade within 1 to 2 weeks.  Blood that collects in the tissue (hematoma) may be painful to the touch. It should  usually decrease in size and tenderness within 1 to 2 weeks.  SEEK IMMEDIATE MEDICAL CARE IF: You have unusual pain at the radial site.  You have redness, warmth, swelling, or pain at the radial site.  You have drainage (other than a small amount of blood on the dressing).  You have chills.  You have a fever or persistent symptoms for more than 72 hours.  You have a fever and your symptoms suddenly get worse.  Your arm becomes pale, cool, tingly, or numb.  You have heavy bleeding from the site. Hold pressure on the site.   Increase activity slowly   Complete by: As directed         Discharge Medications   Allergies as of 07/20/2024       Reactions   Bee Venom Anaphylaxis   Celexa [citalopram Hydrobromide] Anaphylaxis   Coconut (cocos Nucifera) Anaphylaxis, Dermatitis   Methylphenidate Other (See Comments)    High BP   Penicillins Anaphylaxis   Has patient had a PCN reaction causing immediate rash, facial/tongue/throat swelling, SOB or lightheadedness with hypotension: Yes Has patient had a PCN reaction causing severe rash involving mucus membranes or skin necrosis: No Has patient had a PCN reaction that required hospitalization No Has patient had a PCN reaction occurring within the last 10 years: No If all of the above answers are NO, then may proceed with Cephalosporin use.        Medication List     TAKE these medications    acetaminophen  500 MG tablet Commonly known as: TYLENOL  Take 500 mg by mouth every 6 (six) hours as needed for moderate pain (pain score 4-6), mild pain (pain score 1-3) or headache.   amLODipine 10 MG tablet Commonly known as: NORVASC Take 10 mg by mouth daily.   aspirin  EC 81 MG tablet Take 81 mg by mouth daily. Swallow whole.   buPROPion  300 MG 24 hr tablet Commonly known as: WELLBUTRIN  XL Take 300 mg by mouth daily.   clopidogrel 75 MG tablet Commonly known as: Plavix Take 1 tablet (75 mg total) by mouth daily.   levothyroxine   112 MCG tablet Commonly known as: SYNTHROID  Take 112 mcg by mouth daily before breakfast.   liothyronine  5 MCG tablet Commonly known as: CYTOMEL  Take 5 mcg by mouth daily.   losartan -hydrochlorothiazide  100-25 MG tablet Commonly known as: HYZAAR Take 1 tablet by mouth daily.   metoprolol tartrate 25 MG tablet Commonly known as: LOPRESSOR Take 0.5 tablets (12.5 mg total) by mouth 2 (two) times daily.   montelukast 10 MG tablet Commonly known as: SINGULAIR Take 10 mg by mouth daily as needed (allergies).   multivitamin capsule Take 1 capsule by mouth daily. One a day 50+   nitroGLYCERIN  0.4 MG SL tablet Commonly known as: Nitrostat  Place 1 tablet (0.4 mg total) under the tongue every 5 (five) minutes as needed for chest pain.   rosuvastatin 20 MG tablet Commonly known as: CRESTOR Take 20 mg by mouth daily.   Xanax  0.25 MG tablet Generic drug: ALPRAZolam  Take 0.25 mg by mouth daily as needed for anxiety.  Allergies Allergies  Allergen Reactions   Bee Venom Anaphylaxis   Celexa [Citalopram Hydrobromide] Anaphylaxis   Coconut (Cocos Nucifera) Anaphylaxis and Dermatitis   Methylphenidate Other (See Comments)     High BP   Penicillins Anaphylaxis    Has patient had a PCN reaction causing immediate rash, facial/tongue/throat swelling, SOB or lightheadedness with hypotension: Yes Has patient had a PCN reaction causing severe rash involving mucus membranes or skin necrosis: No Has patient had a PCN reaction that required hospitalization No Has patient had a PCN reaction occurring within the last 10 years: No If all of the above answers are NO, then may proceed with Cephalosporin use.     Outstanding Labs/Studies     Duration of Discharge Encounter   Greater than 30 minutes including physician time.  Signed, Thom LITTIE Sluder, PA-C 07/20/2024, 4:43 PM

## 2024-07-20 NOTE — Progress Notes (Signed)
 Pt was educated on stent card, stent location, Antiplatelet and ASA use, wt restrictions, no baths/daily wash-ups, s/s of infection, ex guidelines, s/s to stop exercising, NTG use and calling 911, heart healthy diet, risk factors and CRPII. Pt received materials on exercise, diet, and CRPII. Will refer to [facility].   Garen FORBES Candy MS, ACSM-CEP  07/20/2024 2:06 PM

## 2024-07-20 NOTE — Interval H&P Note (Signed)
 History and Physical Interval Note:  07/20/2024 10:47 AM  Doris Davis  has presented today for surgery, with the diagnosis of abnormal cta.  The various methods of treatment have been discussed with the patient and family. After consideration of risks, benefits and other options for treatment, the patient has consented to  Procedure(s): LEFT HEART CATH AND CORONARY ANGIOGRAPHY (N/A)  PERCUTANEOUS CORONARY INTERVENTION  as a surgical intervention.  The patient's history has been reviewed, patient examined, no change in status, stable for surgery.  I have reviewed the patient's chart and labs.  Questions were answered to the patient's satisfaction.    Cath Lab Visit (complete for each Cath Lab visit)  Clinical Evaluation Leading to the Procedure:   ACS: No.  Non-ACS:    Anginal Classification: CCS II  Anti-ischemic medical therapy: Minimal Therapy (1 class of medications)  Non-Invasive Test Results: Intermediate-risk stress test findings: cardiac mortality 1-3%/year  Prior CABG: No previous CABG     Alm Clay

## 2024-07-20 NOTE — Discharge Instructions (Signed)

## 2024-07-21 ENCOUNTER — Telehealth (HOSPITAL_COMMUNITY): Payer: Self-pay

## 2024-07-21 ENCOUNTER — Encounter (HOSPITAL_COMMUNITY): Payer: Self-pay | Admitting: Cardiology

## 2024-07-21 NOTE — Telephone Encounter (Signed)
Called patient to see if she is interested in the Cardiac Rehab Program. Patient expressed interest. Explained scheduling process and went over insurance process, patient verbalized understanding. Will contact patient for scheduling once f/u has been completed. °

## 2024-07-22 ENCOUNTER — Emergency Department (HOSPITAL_COMMUNITY)

## 2024-07-22 ENCOUNTER — Emergency Department (HOSPITAL_COMMUNITY)
Admission: EM | Admit: 2024-07-22 | Discharge: 2024-07-22 | Disposition: A | Discharge status: Home / Self Care | Attending: Emergency Medicine | Admitting: Emergency Medicine

## 2024-07-22 ENCOUNTER — Other Ambulatory Visit: Payer: Self-pay

## 2024-07-22 ENCOUNTER — Telehealth: Payer: Self-pay | Admitting: Cardiology

## 2024-07-22 DIAGNOSIS — R079 Chest pain, unspecified: Secondary | ICD-10-CM

## 2024-07-22 DIAGNOSIS — Z7982 Long term (current) use of aspirin: Secondary | ICD-10-CM | POA: Insufficient documentation

## 2024-07-22 DIAGNOSIS — F17210 Nicotine dependence, cigarettes, uncomplicated: Secondary | ICD-10-CM | POA: Diagnosis not present

## 2024-07-22 DIAGNOSIS — E039 Hypothyroidism, unspecified: Secondary | ICD-10-CM | POA: Insufficient documentation

## 2024-07-22 DIAGNOSIS — I2510 Atherosclerotic heart disease of native coronary artery without angina pectoris: Secondary | ICD-10-CM | POA: Diagnosis not present

## 2024-07-22 DIAGNOSIS — R42 Dizziness and giddiness: Secondary | ICD-10-CM | POA: Insufficient documentation

## 2024-07-22 DIAGNOSIS — I10 Essential (primary) hypertension: Secondary | ICD-10-CM | POA: Diagnosis not present

## 2024-07-22 DIAGNOSIS — Z79899 Other long term (current) drug therapy: Secondary | ICD-10-CM | POA: Diagnosis not present

## 2024-07-22 DIAGNOSIS — F419 Anxiety disorder, unspecified: Secondary | ICD-10-CM | POA: Diagnosis not present

## 2024-07-22 DIAGNOSIS — R457 State of emotional shock and stress, unspecified: Secondary | ICD-10-CM | POA: Diagnosis not present

## 2024-07-22 DIAGNOSIS — J45909 Unspecified asthma, uncomplicated: Secondary | ICD-10-CM | POA: Insufficient documentation

## 2024-07-22 DIAGNOSIS — I491 Atrial premature depolarization: Secondary | ICD-10-CM | POA: Diagnosis not present

## 2024-07-22 LAB — BASIC METABOLIC PANEL WITH GFR
Anion gap: 11 (ref 5–15)
BUN: 12 mg/dL (ref 6–20)
CO2: 22 mmol/L (ref 22–32)
Calcium: 9.4 mg/dL (ref 8.9–10.3)
Chloride: 106 mmol/L (ref 98–111)
Creatinine, Ser: 0.66 mg/dL (ref 0.44–1.00)
GFR, Estimated: >60 mL/min (ref >60)
Glucose, Bld: 95 mg/dL (ref 70–99)
Potassium: 3.5 mmol/L (ref 3.5–5.1)
Sodium: 139 mmol/L (ref 135–145)

## 2024-07-22 LAB — CBC
HCT: 31.2 % — ABNORMAL LOW (ref 36.0–46.0)
Hemoglobin: 9.9 g/dL — ABNORMAL LOW (ref 12.0–15.0)
MCH: 25.9 pg — ABNORMAL LOW (ref 26.0–34.0)
MCHC: 31.7 g/dL (ref 30.0–36.0)
MCV: 81.7 fL (ref 80.0–100.0)
Platelets: 260 K/uL (ref 150–400)
RBC: 3.82 MIL/uL — ABNORMAL LOW (ref 3.87–5.11)
RDW: 15.2 % (ref 11.5–15.5)
WBC: 8 K/uL (ref 4.0–10.5)
nRBC: 0 % (ref 0.0–0.2)

## 2024-07-22 LAB — TROPONIN I (HIGH SENSITIVITY)
Troponin I (High Sensitivity): 145 ng/L (ref <18)
Troponin I (High Sensitivity): 139 ng/L (ref <18)
Troponin I (High Sensitivity): 125 ng/L (ref <18)
Troponin I (High Sensitivity): 116 ng/L (ref <18)

## 2024-07-22 LAB — ECHOCARDIOGRAM LIMITED: S' Lateral: 2.9 cm

## 2024-07-22 MED ORDER — PANTOPRAZOLE SODIUM 40 MG PO TBEC: 40.0000 mg | DELAYED_RELEASE_TABLET | Freq: Once | ORAL | Status: DC

## 2024-07-22 MED ORDER — AMLODIPINE BESYLATE 10 MG PO TABS: 5.0000 mg | ORAL_TABLET | Freq: Every day | ORAL | 0 refills | Status: DC

## 2024-07-22 MED ORDER — SUCRALFATE 1 G PO TABS: 1.0000 g | ORAL_TABLET | Freq: Once | ORAL | Status: DC

## 2024-07-22 MED ORDER — MECLIZINE HCL 25 MG PO TABS
25.0000 mg | ORAL_TABLET | Freq: Once | ORAL | Status: AC
  Administered 2024-07-22: 25 mg via ORAL
  Filled 2024-07-22: qty 1

## 2024-07-22 MED ORDER — FAMOTIDINE 20 MG PO TABS: 20.0000 mg | ORAL_TABLET | Freq: Two times a day (BID) | ORAL | 0 refills | Status: DC

## 2024-07-22 MED ORDER — PERFLUTREN LIPID MICROSPHERE
1.0000 mL | INTRAVENOUS | Status: DC | PRN
  Administered 2024-07-22: 3 mL via INTRAVENOUS

## 2024-07-22 MED ORDER — SUCRALFATE 1 GM/10ML PO SUSP: 1.0000 g | Freq: Three times a day (TID) | ORAL | 0 refills | Status: DC

## 2024-07-22 MED ORDER — FAMOTIDINE 20 MG PO TABS: 20.0000 mg | ORAL_TABLET | Freq: Once | ORAL | Status: DC

## 2024-07-22 MED ORDER — PANTOPRAZOLE SODIUM 20 MG PO TBEC: 20.0000 mg | DELAYED_RELEASE_TABLET | Freq: Two times a day (BID) | ORAL | 0 refills | Status: DC

## 2024-07-22 NOTE — ED Provider Notes (Signed)
  [place] EMERGENCY DEPARTMENT AT [facility] Provider Assume Care Note I assumed care of Doris Davis on 07/22/2024 at 7 AM from Dr. Lorette.   Briefly, Doris Davis is a 53 y.o. female who: PMHx: CAD, elevated BMI, HTN, anxiety P/w multiple symptoms Had recent cardiac catheterization with mid RCA stent 2 days ago, was discharged, has not had any chest pain Overnight, woke up with chest tightness, shortness of breath.  EKG here with nonspecific inferior T wave inversions Patient intermittently lightheaded while here, however these episodes do not correlate to blood pressure changes or arrhythmia on the monitor Initial troponin 145, delta pending, cardiology to see   Plan at the time of handoff: Follow-up cardiology recommendations, anticipate admission to cardiology versus medicine pending cardiology recommendations and delta troponin   Please refer to the original provider's note for additional information regarding the care of Doris Davis.  Reassessment: I personally reassessed the patient: Patient without any acute complaints or additional questions.  Vital Signs:  ED Triage Vitals [07/22/24 0420]  Encounter Vitals Group     BP (!) 139/94     Girls Systolic BP Percentile      Girls Diastolic BP Percentile      Boys Systolic BP Percentile      Boys Diastolic BP Percentile      Pulse Rate 91     Resp 13     Temp 97.9 F (36.6 C)     Temp Source Oral     SpO2 98 %     Weight      Height      Head Circumference      Peak Flow      Pain Score      Pain Loc      Pain Education      Exclude from Growth Chart      Hemodynamics:  The patient is hemodynamically stable. Mental Status:  The patient is alert  Additional MDM: Cardiology evaluated the patient, feel that she requires a formal echo to evaluate for wall motion abnormality to further determine disposition.  This was obtained and does not demonstrate acute abnormality.  Cardiology feels that  patient's symptoms are most likely due to low normotension, recommend decreasing her home amlodipine from 10 mg to 5 mg, they also will move up her outpatient postprocedural clinic visit to 11/19.  Patient reevaluated, vitally stable, well-appearing, no current complaints, feel that it is reasonable for patient to have outpatient management, patient is comfortable with this plan, discharged in stable condition.  Disposition: DISCHARGE: I believe that the patient is safe for discharge home with outpatient follow-up. Patient was informed of all pertinent physical exam, laboratory, and imaging findings. Patient's suspected etiology of their symptom presentation was discussed with the patient and all questions were answered. We discussed following up with PCP and cardiology. I provided thorough ED return precautions. The patient feels safe and comfortable with this plan.   Doris Davis Later, MD Emergency Medicine    Later Doris RAMAN, MD 07/22/24 [phone]

## 2024-07-22 NOTE — ED Provider Notes (Signed)
 [place] EMERGENCY DEPARTMENT AT [facility] Provider Note   CSN: [id number] Arrival date & time: 07/22/24  9587     Patient presents with: Chest Pain   Doris Davis is a 53 y.o. female.   The patient is presenting with chest pain that began at 3:00 AM this morning. The pain is described as a tight, pressure-like sensation that radiates to the jaw and is associated with shortness of breath and dizziness. The patient reports that the pain is similar to what was experienced during a recent cardiac catheterization. The patient has a history of a stent placement two days ago following the discovery of a 75% blockage. The patient took nitroglycerin  at home, which provided temporary relief, but the pain has since returned, albeit less severe. The patient is currently on Plavix and baby aspirin . EMS provided transport to the hospital. History was obtained from the patient.     Chest Pain      Prior to Admission medications   Medication Sig Start Date End Date Taking? Authorizing Provider  acetaminophen  (TYLENOL ) 500 MG tablet Take 500 mg by mouth every 6 (six) hours as needed for moderate pain (pain score 4-6), mild pain (pain score 1-3) or headache.    [provider]  ALPRAZolam  (XANAX ) 0.25 MG tablet Take 0.25 mg by mouth daily as needed for anxiety. 12/10/23   [provider]  amLODipine (NORVASC) 10 MG tablet Take 10 mg by mouth daily.    [provider]  aspirin  EC 81 MG tablet Take 81 mg by mouth daily. Swallow whole.    [provider]  buPROPion  (WELLBUTRIN  XL) 300 MG 24 hr tablet Take 300 mg by mouth daily. 03/01/16   [provider]  clopidogrel (PLAVIX) 75 MG tablet Take 1 tablet (75 mg total) by mouth daily. 07/20/24 07/20/25  Darryle Thom CROME, PA-C  levothyroxine  (SYNTHROID ) 112 MCG tablet Take 112 mcg by mouth daily before breakfast.    [provider]  liothyronine  (CYTOMEL ) 5 MCG tablet Take 5 mcg by mouth  daily.    [provider]  losartan -hydrochlorothiazide  (HYZAAR) 100-25 MG tablet Take 1 tablet by mouth daily.    [provider]  metoprolol tartrate (LOPRESSOR) 25 MG tablet Take 0.5 tablets (12.5 mg total) by mouth 2 (two) times daily. 07/13/24 10/11/24  Meng, Hao, PA  montelukast (SINGULAIR) 10 MG tablet Take 10 mg by mouth daily as needed (allergies).    [provider]  Multiple Vitamin (MULTIVITAMIN) capsule Take 1 capsule by mouth daily. One a day 50+    [provider]  nitroGLYCERIN  (NITROSTAT ) 0.4 MG SL tablet Place 1 tablet (0.4 mg total) under the tongue every 5 (five) minutes as needed for chest pain. 07/20/24 07/20/25  Darryle Thom CROME, PA-C  rosuvastatin (CRESTOR) 20 MG tablet Take 20 mg by mouth daily.    [provider]    Allergies: Bee venom, Celexa [citalopram hydrobromide], Coconut (cocos nucifera), Methylphenidate, and Penicillins    Review of Systems  Cardiovascular:  Positive for chest pain.    Updated Vital Signs BP (!) 139/94 (BP Location: Right Arm)   Pulse 91   Temp 97.9 F (36.6 C) (Oral)   Resp 13   LMP 07/10/2011   SpO2 98%   Physical Exam Vitals and nursing note reviewed.  Constitutional:      Appearance: She is well-developed.  HENT:     Head: Normocephalic and atraumatic.  Cardiovascular:     Rate and Rhythm: Normal rate  and regular rhythm.  Pulmonary:     Effort: No respiratory distress.     Breath sounds: No stridor.  Abdominal:     General: There is no distension.  Musculoskeletal:     Cervical back: Normal range of motion.  Neurological:     Mental Status: She is alert.     (all labs ordered are listed, but only abnormal results are displayed) Labs Reviewed  CBC - Abnormal; Notable for the following components:      Result Value   RBC 3.82 (*)    Hemoglobin 9.9 (*)    HCT 31.2 (*)    MCH 25.9 (*)    All other components within normal limits  TROPONIN I (HIGH SENSITIVITY) - Abnormal;  Notable for the following components:   Troponin I (High Sensitivity) 145 (*)    All other components within normal limits  BASIC METABOLIC PANEL WITH GFR  TROPONIN I (HIGH SENSITIVITY)    EKG: EKG Interpretation Date/Time:  Thursday July 22 2024 05:07:11 EST Ventricular Rate:  73 PR Interval:  168 QRS Duration:  107 QT Interval:  394 QTC Calculation: 435 R Axis:   -13  Text Interpretation: Sinus rhythm Left ventricular hypertrophy Borderline T abnormalities, inferior leads different from 11/11, similar to 10/20 Confirmed by Lorette Mayo [phone]) on 07/22/2024 5:38:37 AM  Radiology: [facility] Chest 2 View Result Date: 07/22/2024 EXAM: 2 VIEW(S) XRAY OF THE CHEST 07/22/2024 04:56:00 AM COMPARISON: PA and lateral radiographs of chest dated 05/23/2021. CLINICAL HISTORY: Chest pain FINDINGS: LINES, TUBES AND DEVICES: EKG leads noted. LUNGS AND PLEURA: No focal pulmonary opacity. No pleural effusion. No pneumothorax. HEART AND MEDIASTINUM: No acute abnormality of the cardiac and mediastinal silhouettes. BONES AND SOFT TISSUES: Surgical clips at thoracic inlet. No acute osseous abnormality. IMPRESSION: 1. No acute process. Electronically signed by: Evalene Coho MD 07/22/2024 05:03 AM EST RP Workstation: [id number]   CARDIAC CATHETERIZATION Result Date: 07/21/2024 Images from the original result were not included.   Prox RCA lesion is 30% stenosed.   CULPRIT all Lesion: Mid RCA to Dist RCA lesion is 75% stenosed.   After scoring balloon and high-pressure [place] balloon angioplasty, A drug-eluting stent was successfully placed using a STENT SYNERGY XD 4.0X20- > deployed to 4.3 mm and postdilated with a 4.5 mm balloon to low atmospheres leading to full expansion.SABRA  Post intervention, there is a 0% residual stenosis.  TIMI-3 flow maintained   Dist RCA lesion is 30% stenosed.   LV end diastolic pressure is normal. There is no aortic valve stenosis.  -LV gram not performed due to significant ectopy.  Diagnostic  Dominance: Right     Intervention Single-vessel CAD with 75% mid-distal RCA stenosis (heavily fibrotic) Successful PTCA with ScoreFlex and high-pressure [place] balloon followed by DES PCI (Synergy XD 4.0 mm x 20 mm deployed to 4.3 mm). Modest 30% distal RCA stenosis prior to bifurcation. Relatively normal left coronary system. Normal LVEDP PLAN: Same-day discharge-6 months uninterrupted DAPT.  See Recommendations.     Procedures   Medications Ordered in the ED  meclizine (ANTIVERT) tablet 25 mg (25 mg Oral Given 07/22/24 0652)    Clinical Course as of 07/22/24 0717  Thu Jul 22, 2024  0701 Delta trop and cards consult, wu for fatigue, had mid RCA stent 2d ago, no chest pain, N and tighness last night, SOB, inferior T wave inversions, intermittent lightheadedness here wo blood pressure change  [LS]    Clinical Course User Index [LS] Rogelia Jerilynn RAMAN, MD  Medical Decision Making Amount and/or Complexity of Data Reviewed Labs: ordered. Radiology: ordered.  Risk Prescription drug management.   53 year old female 2 days status post cath and stent here with chest pressure rating to her neck with shortness of breath, nausea, lightheadedness and near syncopal episodes.  Woke up from sleep around 3:00 this morning.  EKG with some inverted T waves inferior leads but nothing ischemic looking.  First troponin was 145.  I discussed with her that this was difficult to interpret secondary to having had a cath recently and it takes a while for it to clear and it is just 1 snapshot.  I discussed with cardiology who will come see her for further evaluation.  Will hold on heparin  this time.  She already had aspirin .  Reevaluation patient states that she is very dizzy feels that she is in a pass out seeing black spots.  Her blood pressure is good her heart rate is good her oxygen is good.  It no arrhythmias noted.  Improved with time.  Still pending cardiology  consult.  Care transferred pending cardiology consult.  Final diagnoses:  None    ED Discharge Orders          Ordered    famotidine (PEPCID) 20 MG tablet  2 times daily,   Status:  Discontinued        07/22/24 0638    pantoprazole (PROTONIX) 20 MG tablet  2 times daily,   Status:  Discontinued        07/22/24 0638    sucralfate (CARAFATE) 1 GM/10ML suspension  3 times daily with meals & bedtime,   Status:  Discontinued        07/22/24 9361               Chiyo Fay, Selinda, MD 07/22/24 2345

## 2024-07-22 NOTE — Consult Note (Addendum)
 Cardiology Consultation  Patient ID: [name] MRN: [id number]; DOB: 1971-02-04  Admit date: 07/22/2024 Date of Consult: 07/22/2024  PCP:  Leonel Cole, MD   [facility] Providers Cardiologist:  Alm Clay, MD     Patient Profile: Doris Davis is a 53 y.o. female with a hx of hypertension, hyperlipidemia, anxiety, ADHD, OSA, hypothyroidism, CAD with recent DES to RCA 07/20/2024 who is being seen 07/22/2024 for the evaluation of chest pain at the request of Dr. Rogelia.  History of Present Illness: [name] has past medical history as stated above.  She presented to the [facility] emergency department on 07/22/2024 via EMS complaining of chest pain after undergoing recent stent placement to RCA.SABRA  Relevant workup has been in the ED includes: Troponin 145 ? 139, CBC shows stable anemia, BMP normal.  CXR shows no acute process.  EKG showed sinus rhythm, HR 73 LVH, no acute ischemic changes when compared to prior EKGs.  Since being in the ED she has been given meclizine 25 mg x 1 dose.  She received sublingual nitroglycerin  x 2 doses as well as 324 mg aspirin  prior to arrival.  She was just recently discharged as a same-day PCI 07/20/2024 after undergoing cardiac catheterization with Dr. Clay.  Her heart catheterization showed single-vessel CAD with a 75% mid-distal RCA stenosis, which was treated with successful PTCA with balloon angioplasty followed by DES to RCA with Synergy XD 4.0 mm x 20 mm deployed to 4.3 mm.  Otherwise the cath showed 30% distal RCA stenosis prior to bifurcation, normal left coronary artery system, normal LVEDP.  Recommendations post cath included, DAPT with aspirin  and Plavix x 6 months, followed by Plavix monotherapy x 6 months.  There were no complications post cath, her radial site was reevaluated prior to discharge and was stable without any complications.  Her medication she was sent home on included: Aspirin  81 mg daily, Plavix 75 mg  daily, amlodipine 10 mg daily, losartan -hydrochlorothiazide  100-25 mg daily, Lopressor 12.5 mg BID, Crestor 20 mg daily, as needed sublingual nitroglycerin .  After speaking with the patient, she tells me that her chest pain came on suddenly around 3 AM.  She reports associated dizziness.  Her chest pain has resolved, she notes it is significantly improved compared to 3 AM.  However she reports that she has remained dizzy.  She reports good compliance with her medications, no missed doses since she has been discharged. She has been doing well otherwise. She is resting comfortably in the room. Her chest pain is reproducible upon palpation to her chest wall.  Past Medical History:  Diagnosis Date   Allergy    Anemia    Anxiety    Arthritis    Asthma    with contact with cats   BV (bacterial vaginosis) 08/09/2013   Family history of adverse reaction to anesthesia    mother was sick for days   Hypertension    PONV (postoperative nausea and vomiting)    sts with ablation she was given meds for nausea and got sicker.   Vaginal bleeding 08/09/2013   Had supra cervical hyst 2012, has had bleeding after sex   Venereal disease 1990   chlamydia   Wears glasses    Past Surgical History:  Procedure Laterality Date   ABDOMINAL HYSTERECTOMY     BREAST CYST EXCISION Left    35 yrs ago- benign   BREAST SURGERY     lump removed from left breast back in the 1980's  BUNIONECTOMY  11/14   rt   CESAREAN SECTION  1995   CHOLECYSTECTOMY N/A 12/26/2020   Procedure: LAPAROSCOPIC CHOLECYSTECTOMY;  Surgeon: Kinsinger, Herlene Righter, MD;  Location: [facility] OR;  Service: General;  Laterality: N/A;   CORONARY STENT INTERVENTION N/A 07/20/2024   Procedure: CORONARY STENT INTERVENTION;  Surgeon: Anner Alm ORN, MD;  Location: [facility] INVASIVE CV LAB;  Service: Cardiovascular;  Laterality: N/A;   ENDOMETRIAL ABLATION  2010   [facility]   FOOT ARTHROPLASTY  11/15   rt-revision bunionectomy-   FOOT SURGERY     FRACTURE SURGERY   2005   left ankle-[place]   HAMMER TOE SURGERY Right 12/01/2014   Procedure: REVISION OF RIGHT 3RD HAMMER TOE CORRECTION;  Surgeon: Norleen Armor, MD;  Location: [facility];  Service: Orthopedics;  Laterality: Right;   JOINT REPLACEMENT     big toe joint removed   LAPAROSCOPIC SUPRACERVICAL HYSTERECTOMY  07/17/2011   Procedure: LAPAROSCOPIC SUPRACERVICAL HYSTERECTOMY;  Surgeon: Vonn VEAR Inch, MD;  Location: [facility] ORS;  Service: Gynecology;  Laterality: N/A;   LEFT HEART CATH AND CORONARY ANGIOGRAPHY N/A 07/20/2024   Procedure: LEFT HEART CATH AND CORONARY ANGIOGRAPHY;  Surgeon: Anner Alm ORN, MD;  Location: [facility] INVASIVE CV LAB;  Service: Cardiovascular;  Laterality: N/A;   REMOVAL OF IMPLANT Right 12/01/2014   Procedure: REMOVAL OF  DEEP IMPLANT FROM RIGHT 3RD TOE;  Surgeon: Norleen Armor, MD;  Location: [facility];  Service: Orthopedics;  Laterality: Right;   THYROIDECTOMY N/A 09/19/2016   Procedure: TOTAL THYROIDECTOMY;  Surgeon: Krystal Spinner, MD;  Location: [facility] OR;  Service: General;  Laterality: N/A;    Home Medications:  Prior to Admission medications   Medication Sig Start Date End Date Taking? Authorizing Provider  acetaminophen  (TYLENOL ) 500 MG tablet Take 500 mg by mouth every 6 (six) hours as needed for moderate pain (pain score 4-6), mild pain (pain score 1-3) or headache.    [provider]  ALPRAZolam  (XANAX ) 0.25 MG tablet Take 0.25 mg by mouth daily as needed for anxiety. 12/10/23   [provider]  amLODipine (NORVASC) 10 MG tablet Take 10 mg by mouth daily.    [provider]  aspirin  EC 81 MG tablet Take 81 mg by mouth daily. Swallow whole.    [provider]  buPROPion  (WELLBUTRIN  XL) 300 MG 24 hr tablet Take 300 mg by mouth daily. 03/01/16   [provider]  clopidogrel (PLAVIX) 75 MG tablet Take 1 tablet (75 mg total) by mouth daily. 07/20/24 07/20/25  Darryle Thom CROME, PA-C  levothyroxine  (SYNTHROID ) 112 MCG  tablet Take 112 mcg by mouth daily before breakfast.    [provider]  liothyronine  (CYTOMEL ) 5 MCG tablet Take 5 mcg by mouth daily.    [provider]  losartan -hydrochlorothiazide  (HYZAAR) 100-25 MG tablet Take 1 tablet by mouth daily.    [provider]  metoprolol tartrate (LOPRESSOR) 25 MG tablet Take 0.5 tablets (12.5 mg total) by mouth 2 (two) times daily. 07/13/24 10/11/24  Meng, Hao, PA  montelukast (SINGULAIR) 10 MG tablet Take 10 mg by mouth daily as needed (allergies).    [provider]  Multiple Vitamin (MULTIVITAMIN) capsule Take 1 capsule by mouth daily. One a day 50+    [provider]  nitroGLYCERIN  (NITROSTAT ) 0.4 MG SL tablet Place 1 tablet (0.4 mg total) under the tongue every 5 (five) minutes as needed for chest pain. 07/20/24 07/20/25  Darryle Thom L, PA-C  rosuvastatin (CRESTOR) 20 MG tablet  Take 20 mg by mouth daily.    [provider]   Scheduled Meds:  Continuous Infusions:  PRN Meds:  Allergies:    Allergies  Allergen Reactions   Bee Venom Anaphylaxis   Celexa [Citalopram Hydrobromide] Anaphylaxis   Coconut (Cocos Nucifera) Anaphylaxis and Dermatitis   Methylphenidate Other (See Comments)     High BP   Penicillins Anaphylaxis    Has patient had a PCN reaction causing immediate rash, facial/tongue/throat swelling, SOB or lightheadedness with hypotension: Yes Has patient had a PCN reaction causing severe rash involving mucus membranes or skin necrosis: No Has patient had a PCN reaction that required hospitalization No Has patient had a PCN reaction occurring within the last 10 years: No If all of the above answers are NO, then may proceed with Cephalosporin use.    Social History:   Social History   Socioeconomic History   Marital status: Married    Spouse name: Not on file   Number of children: Not on file   Years of education: Not on file   Highest education level: Not on file  Occupational  History   Not on file  Tobacco Use   Smoking status: Former    Current packs/day: 0.00    Average packs/day: 0.3 packs/day for 20.0 years (5.0 ttl pk-yrs)    Types: Cigarettes    Start date: 12/25/1993    Quit date: 12/25/2013    Years since quitting: 10.5   Smokeless tobacco: Never  Vaping Use   Vaping status: Never Used  Substance and Sexual Activity   Alcohol use: Yes    Comment: occ   Drug use: No   Sexual activity: Not on file  Other Topics Concern   Not on file  Social History Narrative   Not on file   Social Drivers of Health   Financial Resource Strain: Not on file  Food Insecurity: Not on file  Transportation Needs: Not on file  Physical Activity: Not on file  Stress: Not on file  Social Connections: Not on file  Intimate Partner Violence: Not on file    Family History:   Family History  Problem Relation Age of Onset   Coronary artery disease Mother    Hypertension Mother    Stroke Mother    Diabetes Paternal Grandmother    Diabetes Paternal Uncle    Anesthesia problems Neg Hx    Malignant hyperthermia Neg Hx    Hypotension Neg Hx    Pseudochol deficiency Neg Hx     ROS:  Please see the history of present illness.  All other ROS reviewed and negative.     Physical Exam/Data: Vitals:   07/22/24 0715 07/22/24 0815 07/22/24 0854 07/22/24 1010  BP: 108/79 107/67  103/66  Pulse: 66 65  77  Resp: (!) 9 12  (!) 22  Temp:   98 F (36.7 C)   TempSrc:   Oral   SpO2: 100% 100%  96%   No intake or output data in the 24 hours ending 07/22/24 1158    07/20/2024    8:58 AM 07/13/2024    3:33 PM 06/28/2024   10:29 AM  Last 3 Weights  Weight (lbs) 190 lb 191 lb 12.8 oz 193 lb 12.8 oz  Weight (kg) 86.183 kg 87 kg 87.907 kg     There is no height or weight on file to calculate BMI.   General:  Well nourished, well developed, in no acute distress HEENT: normal Neck: no JVD Vascular: No  carotid bruits; Distal pulses 2+ bilaterally Cardiac:  normal S1, S2;  RRR; no murmur, right radial cath site healing well still remains covered with dressing, chest wall tender to palpation  Lungs:  clear to auscultation bilaterally, no wheezing, rhonchi or rales  Abd: soft, nontender, no hepatomegaly  Ext: no edema Musculoskeletal:  No deformities, BUE and BLE strength normal and equal Skin: warm and dry  Neuro:   no focal abnormalities noted Psych:  Normal affect, anxious   EKG:  The EKG was personally reviewed and demonstrates: Sinus rhythm, HR 73  Telemetry:  Telemetry was personally reviewed and demonstrates: Sinus rhythm, HR 60s  Relevant CV Studies:  Cardiac catheterization, 07/20/2024   Prox RCA lesion is 30% stenosed.   CULPRIT all Lesion: Mid RCA to Dist RCA lesion is 75% stenosed.   After scoring balloon and high-pressure [place] balloon angioplasty, A drug-eluting stent was successfully placed using a STENT SYNERGY XD 4.0X20- > deployed to 4.3 mm and postdilated with a 4.5 mm balloon to low atmospheres leading to full expansion.SABRA  Post intervention, there is a 0% residual stenosis.  TIMI-3 flow maintained   Dist RCA lesion is 30% stenosed.   LV end diastolic pressure is normal. There is no aortic valve stenosis.  -LV gram not performed due to significant ectopy.  Single-vessel CAD with 75% mid-distal RCA stenosis (heavily fibrotic)  Successful PTCA with ScoreFlex and high-pressure [place] balloon followed by DES PCI (Synergy XD 4.0 mm x 20 mm deployed to 4.3 mm). Modest 30% distal RCA stenosis prior to bifurcation. Relatively normal left coronary system. Normal LVEDP  Coronary CTA, 07/13/2024  LAD: No significant stenosis. Proximal FFR = 0.99 , Mid FFR = 0.97 , Distal FFR =0.90 LCX & OM2 Branch: No significant stenosis. Proximal FFR = 0.99, Proximal OM FFR = 0.97 RCA: The mid-RCA has evidence of flow limiting stenosis. Proximal FFR = 0.98 , Mid FFR = 0.74   IMPRESSION: There is evidence of flow limiting stenosis in the mid-RCA vessel. Recommend left  heart catheterization for further evaluation and treatment.  Echocardiogram, 06/23/2024 Left ventricular ejection fraction, by estimation, is 60 to 65% . The left ventricle has normal function. The left ventricle has no regional wall motion abnormalities. Left ventricular diastolic parameters were normal.  Right ventricular systolic function is normal. The right ventricular size is normal. Tricuspid regurgitation signal is inadequate for assessing PA pressure.  The mitral valve is abnormal. Trivial mitral valve regurgitation. No evidence of mitral stenosis.  The aortic valve is tricuspid. Aortic valve regurgitation is not visualized. No aortic stenosis is present. The inferior vena cava is normal in size with greater than 50% respiratory variability, suggesting right atrial pressure of 3 mmHg.  Laboratory Data: High Sensitivity Troponin:   Recent Labs  Lab 07/22/24 0419 07/22/24 0615  TROPONINIHS 145* 139*     Chemistry Recent Labs  Lab 07/22/24 0419  NA 139  K 3.5  CL 106  CO2 22  GLUCOSE 95  BUN 12  CREATININE 0.66  CALCIUM  9.4  GFRNONAA >60  ANIONGAP 11    No results for input(s): PROT, ALBUMIN, AST, ALT, ALKPHOS, BILITOT in the last 168 hours. Lipids No results for input(s): CHOL, TRIG, HDL, LABVLDL, LDLCALC, CHOLHDL in the last 168 hours.  Hematology Recent Labs  Lab 07/22/24 0419  WBC 8.0  RBC 3.82*  HGB 9.9*  HCT 31.2*  MCV 81.7  MCH 25.9*  MCHC 31.7  RDW 15.2  PLT 260   Thyroid  No results for input(s):  TSH, FREET4 in the last 168 hours.  BNPNo results for input(s): BNP, PROBNP in the last 168 hours.  DDimer No results for input(s): DDIMER in the last 168 hours.  Radiology/Studies:  DG Chest 2 View Result Date: 07/22/2024 EXAM: 2 VIEW(S) XRAY OF THE CHEST 07/22/2024 04:56:00 AM COMPARISON: PA and lateral radiographs of chest dated 05/23/2021. CLINICAL HISTORY: Chest pain FINDINGS: LINES, TUBES AND DEVICES: EKG leads  noted. LUNGS AND PLEURA: No focal pulmonary opacity. No pleural effusion. No pneumothorax. HEART AND MEDIASTINUM: No acute abnormality of the cardiac and mediastinal silhouettes. BONES AND SOFT TISSUES: Surgical clips at thoracic inlet. No acute osseous abnormality. IMPRESSION: 1. No acute process. Electronically signed by: Evalene Coho MD 07/22/2024 05:03 AM EST RP Workstation: [id number]   CARDIAC CATHETERIZATION Result Date: 07/21/2024 Images from the original result were not included.   Prox RCA lesion is 30% stenosed.   CULPRIT all Lesion: Mid RCA to Dist RCA lesion is 75% stenosed.   After scoring balloon and high-pressure [place] balloon angioplasty, A drug-eluting stent was successfully placed using a STENT SYNERGY XD 4.0X20- > deployed to 4.3 mm and postdilated with a 4.5 mm balloon to low atmospheres leading to full expansion.SABRA  Post intervention, there is a 0% residual stenosis.  TIMI-3 flow maintained   Dist RCA lesion is 30% stenosed.   LV end diastolic pressure is normal. There is no aortic valve stenosis.  -LV gram not performed due to significant ectopy. Diagnostic  Dominance: Right     Intervention Single-vessel CAD with 75% mid-distal RCA stenosis (heavily fibrotic) Successful PTCA with ScoreFlex and high-pressure [place] balloon followed by DES PCI (Synergy XD 4.0 mm x 20 mm deployed to 4.3 mm). Modest 30% distal RCA stenosis prior to bifurcation. Relatively normal left coronary system. Normal LVEDP PLAN: Same-day discharge-6 months uninterrupted DAPT.  See Recommendations.   Assessment and Plan:  Chest pain Dizziness  Presented to ED via EMS after reporting chest pain with recent stent placement Chest pain came on 3 AM, associated dizziness She reports improvement in her chest pain since arrival  Chest pain reproducible on exam with palpation to chest wall Troponin level 145 ? 139 EKG without acute ischemic changes She was given meclizine 25 mg x 1 dose Discussed various potential  causes for dizziness such as medications, nitroglycerin  use, chronic anemia, low-normal BP Ordering limited echocardiogram to assess for wall motion abnormalities  CAD s/p recent DES to RCA LHC 07/20/2024: single-vessel CAD, 75% mid-distal RCA stenosis, treated with balloon angioplasty and DES to RCA Home meds: ASA 81 mg daily, Plavix 75 mg daily No issues with compliance with medications at home Continue with DAPT as previously discussed  Obtaining limited echo as above   Hypertension BP low-normal today Home meds: Amlodipine 10 mg daily, losartan -hydrochlorothiazide  100-20 mg daily, Lopressor 12.5 mg twice daily Continue with home medications Discussed keeping BP log to ensure that her blood pressure is not running too low, possibly contributing to dizziness  Hyperlipidemia Home meds: Crestor 20 mg daily  Follow up already scheduled for 07/28/2024    Risk Assessment/Risk Scores:      For questions or updates, please contact [facility] Please consult [email or web address] for contact info under    Signed, Waddell DELENA Donath, PA-C  07/22/2024 11:58 AM   I have personally evaluated and examined the patient. The history, physical exam, and medical decision making documented below were performed independently and substantively by me. I have reviewed all relevant data, formulated the assessment and plan, and  assumed responsibility for the management of this patient. My documentation reflects the substantive portion of the split/shared visit, in accordance with CMS and CPT guidelines. I have updated the NPP documentation above as appropriate.  Discussed care prior to the composition of this note. I have personally performed the substantive portion of the medical decision making, including interpretation of diagnostic data, formulation of the management plan, and assessment of risks. In summation:  Ms. Brittingham is a 53 year old with hypertension, hyperlipidemia, and coronary artery  disease who presents with post-procedural chest discomfort.  She presents with her husband.  She is experiencing post-procedural chest discomfort following a recent drug-eluting stent placement to the right coronary artery on July 20, 2024. She had a sudden onset of chest pain at 3 AM, which has since resolved, but she continues to experience dizziness. She also notes significant anxiety associated with these symptoms. During a previous evaluation, her chest pain was noted to be reproducible upon palpation to her chest wall.  Her current medications include Wellbutrin , Cytomel , aspirin , clopidogrel, amlodipine 10 mg, losartan  hydrochlorothiazide  100 mg/25 mg, nitroglycerin  as needed, and rosuvastatin 20 mg. These medications are managed by her primary care doctor.  Laboratory results show a sodium level of 139, potassium 3.5, creatinine 0.66, and a normal GFR greater than 60. High sensitivity troponin is downtrending from 145 to 139. She is anemic with a hemoglobin of 9.9, down from 10.5 on admission. Her glucose level is normal.  She does not consume sodas and prefers drinking water.  Exam notable for  Gen: no distress   Neck: No JVD Cardiac: No Rubs or Gallops, no Murmur, RRR no bruit; bandage over R Radial is still present with good distal pulses. Respiratory: Clear to auscultation bilaterally, normal effort,   respiratory rate GI: Soft, nontender, non-distended  MS: No  edema;  moves all extremities Integument: Skin feels warm Neuro:  At time of evaluation, alert and oriented to person/place/time/situation  Psych: Normal affect, patient feels ok (seen after echo)  Personally reviewed data and interpretation:   Labs notable for Troponin 145-> 139-> 125  LABS Sodium: 139 (07/22/2024) Potassium: 3.5 (07/22/2024) Creatinine: 0.66 (07/22/2024) Hemoglobin: 9.9 (07/22/2024)  DIAGNOSTIC EKG: Sinus rhythm with left ventricular hypertrophy, no secondary polarization, resolved inferior  ST depression (07/22/2024)  Procedure: Echocardiogram with contrast (07/22/2024) Description: Contrast was administered. Heart function appeared normal and more dynamic than prior imaging. No significant valve disease or issues with mild blockage observed.   In assessment and plan:   Coronary artery disease status post recent RCA stent Status post recent RCA stent placement on November 11th, 2025. Current evaluation for post-procedural chest discomfort. Echocardiogram shows normal function and no significant valve disease. Contrast imaging shows no issues  Heart function appears dynamic, likely due to contrast use. No evidence of stent thrombosis or cardiac etiology for dizziness. - Sent a courtesy message to Dr. Genice team to expedite follow-up. - Encouraged participation in cardiac rehabilitation before deductible.  Post-procedural chest pain and dizziness Post-procedural chest pain resolved. Dizziness persists. Chest pain was reproducible upon palpation. Blood pressure has been relatively low, possibly due to amlodipine. No cardiac etiology identified for dizziness. - Will consider stopping or decreasing amlodipine to 5 mg PO nightly if no cardiac etiology is found for dizziness. - Ensure close follow-up with primary care doctor.  Hypertension Blood pressure has been relatively low, possibly due to amlodipine. Current management includes amlodipine, losartan  hydrochlorothiazide , and other medications. - Will consider adjusting amlodipine dosage if blood pressure remains low.  No change to therapy save for decrease in norvasc.  Follow up is closer that prior.  Stanly Leavens, MD FASE [facility] Cardiologist [facility]  [address] [place], [address] [place], [place] [address] [phone]  3:06 PM

## 2024-07-22 NOTE — Discharge Instructions (Addendum)
 Doris Davis  Thank you for allowing us  to take care of you today.  You came to the Emergency Department today because you had some shortness of breath and lightheadedness beginning last night in the setting of your recent stent placement.  Here in the emergency department we got labs and imaging.  Your labs did show that your troponin (heart enzymes) is elevated, however it is downtrending, therefore this is likely result of your recent cath and does not represent a new heart attack or heart damage which we would expect to have a uptrending troponin.  The cardiologist saw you and we got a repeat echo and we are not seeing any abnormalities on that, therefore they feel that you are safe to follow-up in clinic.  They have moved up your follow-up clinic appointment to 11/19.  Also given your blood pressure is on the lower end of normal, this might be contributing to your lightheadedness, therefore we want you to decrease your amlodipine to 5 mg.  Your current dosage is 10 mg, we have sent in a new prescription, however you can also just cut your current pills in half and only take half of your usual 10 mg pill for a 5 mg dose.  To-Do: 1. Please follow-up with your primary doctor within 1 - 2 weeks / as soon as possible.   Please return to the Emergency Department or call 911 if you experience have worsening of your symptoms, or do not get better, chest pain, shortness of breath, severe or significantly worsening pain, high fever, severe confusion, pass out or have any reason to think that you need emergency medical care.   We hope you feel better soon.   Mitzie Later, MD Department of Emergency Medicine [facility] [place]

## 2024-07-22 NOTE — ED Notes (Signed)
 Cards at [facility].

## 2024-07-22 NOTE — ED Triage Notes (Signed)
 Pt brought in by EMS for chest pain, post-op cardiac stent on Tuesday. Describes chest pain as pressure below esophagus. Denies n/v/d. Nitroglycerin  taken at 0300 and 0345, and 4 baby aspirin  taken.    EMS vitals: 124/86 91 HR 97% RA 97.9 F 18 RR

## 2024-07-22 NOTE — Telephone Encounter (Signed)
 She was seen in consultation by Dr. Santo.  Plan was discharge home.

## 2024-07-22 NOTE — Telephone Encounter (Signed)
 Pt had cath and stent with Dr. Anner 11/11. Pt started having chest pain around 3 AM today. She is at ED and wanted to make office aware. Please advise.

## 2024-07-27 DIAGNOSIS — D649 Anemia, unspecified: Secondary | ICD-10-CM | POA: Diagnosis not present

## 2024-07-27 DIAGNOSIS — I2510 Atherosclerotic heart disease of native coronary artery without angina pectoris: Secondary | ICD-10-CM | POA: Diagnosis not present

## 2024-07-27 DIAGNOSIS — G4733 Obstructive sleep apnea (adult) (pediatric): Secondary | ICD-10-CM | POA: Diagnosis not present

## 2024-07-27 DIAGNOSIS — R5383 Other fatigue: Secondary | ICD-10-CM | POA: Diagnosis not present

## 2024-07-28 ENCOUNTER — Ambulatory Visit: Attending: Physician Assistant | Admitting: Physician Assistant

## 2024-07-28 ENCOUNTER — Encounter: Payer: Self-pay | Admitting: Physician Assistant

## 2024-07-28 VITALS — BP 104/68 | HR 67 | Ht 63.0 in | Wt 189.2 lb

## 2024-07-28 DIAGNOSIS — I10 Essential (primary) hypertension: Secondary | ICD-10-CM | POA: Diagnosis not present

## 2024-07-28 DIAGNOSIS — E785 Hyperlipidemia, unspecified: Secondary | ICD-10-CM

## 2024-07-28 DIAGNOSIS — I2510 Atherosclerotic heart disease of native coronary artery without angina pectoris: Secondary | ICD-10-CM | POA: Diagnosis not present

## 2024-07-28 NOTE — Progress Notes (Signed)
 Cardiology Office Note   Date:  07/30/2024  ID:  Doris, Davis 1971/07/30, MRN [id number] PCP: Leonel Cole, MD  [facility] Providers Cardiologist:  Alm Clay, MD     History of Present Illness Doris Davis is a 53 y.o. female with past medical history of hypertension, hyperlipidemia, anxiety, ADHD, OSA, hypothyroidism and recently diagnosed CAD. She take losartan -HCTZ, amlodipine  and levothyroxine  at home. She was recently seen by her PCP and complained of nocturnal leg burning sensation when she lay down and also fluttering sensation in her chest and back about once a week. She also mentioned a blood pressure jumped up to 200/140 during stressful episode. During physical exam, she was noted to have a faint heart murmur. She is caring for her father who has frequent hospitalization due to heart failure. Blood work obtained on 06/17/2024 showed a low iron level, normal ferritin level and normal transferrin. Hemoglobin A1c 5.7. Cholesterol 250, triglyceride 233, LDL 153, HDL 55. Creatinine 0.65, normal electrolyte and liver enzyme. Microalbumin to creatinine ratio normal. Hemoglobin 10.8, hematocrit 32.6, white blood cell count 6.7. TSH 2.54. Coronary calcium  scoring test obtained on 06/23/2024 showed total calcium  score of 167, 97 percentile for age and sex matched control. Echocardiogram obtained on 06/23/2024 showed EF 60 to 65%, normal RV, trivial MR. Although she did have an atypical chest pain, I was concerned about his dyspnea on exertion and fatigue.  Given her elevated calcium  score, I ordered a coronary CTA and a ZIO monitor.  Coronary CTA came back showing mild to moderate disease in the OM and the severe disease in mid RCA with positive FFR.  She ultimately underwent cardiac catheterization on 07/20/2024 that showed 75% mid to distal RCA lesion treated with Synergy XD 4.0 x 20 mm DES, 30% distal RCA lesion and a 30% proximal RCA lesion.  She was discharged on the same day  with aspirin  and Plavix   With recommendation to continue dual antiplatelet therapy for 68-month followed by Plavix  monotherapy for another 6 months.  Since discharge, she returned back to the ED on 07/22/2024 due to sudden onset of chest pain that woke her up from sleep along with dizziness.  Her chest pain was reproducible with palpation.  Serial troponin 145-->139-->125-->116.  She was given a single dose of meclizine  in the emergency room.  Limited echocardiogram obtained on 07/22/2024 showed EF 70 to 75%, mild asymmetric LVH of basal septal segment.  Patient presents today for follow-up.  Chest discomfort lasted several days before resolving.  It does not have clear correlation with degree of physical exertion.  She appears to be euvolemic on exam.  Downtrending troponin in the emergency room was reassuring and likely from the recent intracoronary procedure.  She requested a referral to nutritionist service.  Blood pressure has been on the low side.  She has been fatigued.  I suspect this is related to her borderline low blood pressure.  I will stop her amlodipine  and metoprolol .  Otherwise, she can follow-up with Dr. Clay in 3 months.   ROS:   She denies chest pain, palpitations, dyspnea, pnd, orthopnea, n, v, dizziness, syncope, edema, weight gain, or early satiety. All other systems reviewed and are otherwise negative except as noted above.   Patient complains of fatigue.  Studies Reviewed      Cardiac Studies & Procedures   ______________________________________________________________________________________________ CARDIAC CATHETERIZATION  CARDIAC CATHETERIZATION 07/20/2024  Conclusion Images from the original result were not included.    Prox RCA lesion is 30%  stenosed.   CULPRIT all Lesion: Mid RCA to Dist RCA lesion is 75% stenosed.   After scoring balloon and high-pressure [place] balloon angioplasty, A drug-eluting stent was successfully placed using a STENT SYNERGY XD 4.0X20- >  deployed to 4.3 mm and postdilated with a 4.5 mm balloon to low atmospheres leading to full expansion.SABRA  Post intervention, there is a 0% residual stenosis.  TIMI-3 flow maintained   Dist RCA lesion is 30% stenosed.   LV end diastolic pressure is normal. There is no aortic valve stenosis.  -LV gram not performed due to significant ectopy.  Diagnostic  Dominance: Right     Intervention  Single-vessel CAD with 75% mid-distal RCA stenosis (heavily fibrotic) Successful PTCA with ScoreFlex and high-pressure [place] balloon followed by DES PCI (Synergy XD 4.0 mm x 20 mm deployed to 4.3 mm). Modest 30% distal RCA stenosis prior to bifurcation. Relatively normal left coronary system. Normal LVEDP   PLAN: Same-day discharge-6 months uninterrupted DAPT.  See Recommendations.  Findings Coronary Findings Diagnostic  Dominance: Right  Left Main Vessel was injected. Vessel is large. Vessel is angiographically normal.  Left Anterior Descending Vessel was injected. Vessel is large. The vessel exhibits minimal luminal irregularities. The vessel is moderately tortuous.  First Diagonal Branch Vessel is small in size.  Third Diagonal Branch Vessel is small in size.  Left Circumflex Vessel is large.  First Obtuse Marginal Branch Vessel is small in size.  Second Obtuse Marginal Branch  Third Obtuse Marginal Branch Vessel is large in size.  Left Posterior Atrioventricular Artery Vessel is small in size.  Right Coronary Artery Vessel was injected. Vessel is large. Prox RCA lesion is 30% stenosed. Mid RCA to Dist RCA lesion is 75% stenosed. Vessel is the culprit lesion. The lesion is type B2, focal and concentric. Heavily fibrotic.  Likely fibrofatty Dist RCA lesion is 30% stenosed.  Right Ventricular Branch Vessel is small in size.  Intervention  Mid RCA to Dist RCA lesion Stent Lesion length:  18 mm. CATH VISTA GUIDE 6FR JR4 ECOPK guide catheter was inserted. Lesion crossed with  guidewire using a WIRE ASAHI PROWATER 180CM. Pre-stent angioplasty was performed using a BALLOON EMERGE MR 3.5X12. Maximum pressure:  16 atm. Inflation time:  20 sec.  A second angioplasty balloon was used, using a BALLOON SCOREFLEX 4.0X15. Maximum pressure:  12 atm. Inflation time:  30 sec.  A third angioplasty balloon was used, using a BALLOON WOLVERINE 4.00X15. Maximum pressure:  16 atm.  Inflation time:  30 sec. Each balloon until the [place] balloon was used had a notable dog bone and a focal segment in the proximal portion of the lesion. A drug-eluting stent was successfully placed using a STENT SYNERGY XD 4.0X20. After the focal segment finally fully expanded with the [place] balloon. Maximum pressure: 16 atm. Inflation time: 30 sec. Stent strut is well apposed. Post-stent angioplasty was performed using a BALLOON SAPPHIRE NC24 4.5X12. Maximum pressure:  8 atm. Inflation time:  30 sec. The midportion that was commensurate with the more significant portion of the original lesion [facility] .[phone] was initial wire for  balloon angioplasty and stent placement having removed the other wire due to the [place] wire being damaged while attempting to advance the [facility] cutting balloon that was not able to be advanced down the lesion. Post-Intervention Lesion Assessment The intervention was successful. Pre-interventional TIMI flow is 3. Post-intervention TIMI flow is 3. Treated lesion length:  20 mm. No complications occurred at this lesion. There is a 0%  residual stenosis post intervention.     ECHOCARDIOGRAM  ECHOCARDIOGRAM LIMITED 07/22/2024  Narrative ECHOCARDIOGRAM LIMITED REPORT    Patient Name:   JOSCLYN ROSALES Date of Exam: 07/22/2024 Medical Rec #:  [id number]      Height:       63.0 in Accession #:    [id number]     Weight:       190.0 lb Date of Birth:  02-27-1971      BSA:          1.892 m Patient Age:    53 years       BP:           114/76 mmHg Patient Gender: F              HR:            74 bpm. Exam Location:  Inpatient  Procedure: Limited Echo, Color Doppler and Intracardiac Opacification Agent  Indications:    Chest Pain  History:        Patient has prior history of Echocardiogram examinations, most recent 06/23/2024. CAD; Risk Factors:Hypertension.  Sonographer:    Carmelita Hartshorn RDCS, FE, PE Referring Phys: [id number] TAYLOR A PARCELLS  IMPRESSIONS   1. Left ventricular ejection fraction, by estimation, is 70 to 75%. The left ventricle has hyperdynamic function. There is mild asymmetric left ventricular hypertrophy of the basal-septal segment. 2. No evidence of mitral valve regurgitation. 3. Aortic valve regurgitation is not visualized. No aortic stenosis is present.  Comparison(s): Prior images reviewed side by side. Hyperdynamic function; this may reflect contrast imaging (not performed prior).  FINDINGS Left Ventricle: Left ventricular ejection fraction, by estimation, is 70 to 75%. The left ventricle has hyperdynamic function. Definity  contrast agent was given IV to delineate the left ventricular endocardial borders. There is mild asymmetric left ventricular hypertrophy of the basal-septal segment.  Aortic Valve: Aortic valve regurgitation is not visualized. No aortic stenosis is present.  LEFT VENTRICLE PLAX 2D LVIDd:         4.80 cm LVIDs:         2.90 cm LV PW:         1.00 cm LV IVS:        1.10 cm   LEFT ATRIUM         Index LA diam:    3.50 cm 1.85 cm/m  Stanly Leavens MD Electronically signed by Stanly Leavens MD Signature Date/Time: 07/22/2024/3:13:07 PM    Final      CT SCANS  CT CORONARY MORPH W/CTA COR W/SCORE 07/12/2024  Addendum 07/18/2024  5:43 PM ADDENDUM REPORT: 07/18/2024 17:40  EXAM: OVER-READ INTERPRETATION  CT CHEST  The following report is an over-read performed by radiologist Dr. Andrea Gasman of [facility] on 07/18/2024. This over-read does not include interpretation of cardiac  or coronary anatomy or pathology. The coronary CTA interpretation by the cardiologist is attached.  COMPARISON:  Cardiac CT 06/23/2024  FINDINGS: Vascular: Aortic atherosclerosis. The included aorta is normal in caliber.  Mediastinum/nodes: No adenopathy or mass. Unremarkable esophagus.  Lungs: No focal airspace disease. No pulmonary nodule. No pleural fluid. The included airways are patent.  Upper abdomen: No acute or unexpected findings.  Musculoskeletal: There are no acute or suspicious osseous abnormalities.  IMPRESSION: Aortic Atherosclerosis (ICD10-I70.0).   Electronically Signed By: Andrea Gasman M.D. On: 07/18/2024 17:40  Narrative HISTORY: chest pain  EXAM: Cardiac/Coronary CT  TECHNIQUE: The patient was scanned on a [employer].  PROTOCOL: A 120  kV prospective scan was triggered in the descending thoracic aorta at 111 HU's. Axial non-contrast 3 mm slices were carried out through the heart. The data set was analyzed on a dedicated work station and scored using the Agatston method. Gantry rotation speed was 250 msecs and collimation was 0.6 mm. Heart rate was optimized medically and sl NTG was given. The 3D data set was reconstructed in 5% intervals of the 35-75 % of the R-R cycle. Systolic and diastolic phases were analyzed on a dedicated work station using MPR, MIP and VRT modes. The patient received 100mL OMNIPAQUE  IOHEXOL  350 MG/ML SOLN of contrast.  FINDINGS: Image Quality: Good  Coronary calcium  score: The patient's coronary artery calcium  score is 148, which places the patient in the 97th percentile.  Coronary arteries: Right dominant coronary system. Normal coronary origins.  Left Main: Normal caliber vessel. No significant plaque or stenosis.  Left Anterior Descending: Normal caliber vessel. The proximal to distal LAD has mild scattered, mixed, calcified and non-calcified plaque resulting in <25% stenosis.  Diagonal  branches: Normal caliber vessels without significant plaque or stenosis proximally. The distal aspects of these vessels are too small to characterize.  Left Circumflex Artery: Normal caliber vessel that becomes diminutive distally. There is minimal calcified plaque in the proximal LCx resulting in <25% stenosis.  Obtuse Marginal branches: OM1 has a high origin off of the LCx and is without significant plaque or stenosis. OM2 is a large vessel that has a segment of luminal narrowing from either non-calcified plaque or artifact resulting in 25-49% stenosis. The remaining vessel is largely free of disease.  Right Coronary Artery: Normal caliber vessel, gives rise to PDA. The mid-RCA has a severe stenosis from predominately non-calcified plaque resulting in 70-99% stenosis. The proximal vessel has minimal calcified plaque. The distal vessel is largely free of disease.  EXTRA-CORONARY FINDINGS: EXTRA-CORONARY FINDINGS Aorta: Normal size, 31 mm x 31 mm at the mid ascending aorta (level of the PA bifurcation) measured double oblique. There is mild aortic calcifications in the descending thoracic aorta. No dissection seen in visualized portions of the aorta.  Normal size of the pulmonary artery  Aortic Valve: Trileaflet without calcifications.  Normal pulmonary vein drainage into the left atrium.  Normal left atrial appendage without a thrombus.  Normal appearance of the pericardium.  Non-cardiac findings will be assessed and reported in a separate document by radiology.  IMPRESSION: 1. There is evidence of mixed calcified and non-calcified coronary artery disease in all 3 major epicardial coronary arteries. The most notable disease is seen in the mid RCA with a 70-99% stenosis resulting from non-calcified plaque. The second OM branch has a proximal stenosis of 25-49%, but it is unclear if this is a true lesion from soft plaque or artifact. CADRADS = 4A. CT FFR will  be performed.  2. Coronary calcium  score of 148. This was 97th percentile for age-, sex-, and race- matched controls.  3. Total plaque volume = 124 mm3.  4. Normal coronary origin with right dominance.  INTERPRETATION:  CAD-RADS 4: Severe stenosis. (70-99% or > 50% left main). Cardiac catheterization or CT FFR is recommended. Consider symptom-guided anti-ischemic pharmacotherapy as well as risk factor modification per guideline directed care. Invasive coronary angiography recommended with revascularization per published guideline statements.  Electronically Signed: By: Georganna Archer On: 07/13/2024 11:02   CT SCANS  CT CARDIAC SCORING (SELF PAY ONLY) 06/30/2024  Addendum 06/30/2024  1:34 AM ADDENDUM REPORT: 06/30/2024 01:31  EXAM: OVER-READ INTERPRETATION  CT CHEST  The following report is an over-read performed by radiologist Dr. Oneil Devonshire of [facility] on 06/30/2024. This over-read does not include interpretation of cardiac or coronary anatomy or pathology. The coronary calcium  score interpretation by the cardiologist is attached.  COMPARISON:  12/25/2020  FINDINGS: Cardiovascular: Atherosclerotic calcifications of the aorta are seen without aneurysmal dilatation.  Mediastinum/Nodes: There are no enlarged lymph nodes within the visualized mediastinum.  Lungs/Pleura: There is no pleural effusion. The visualized lungs appear clear.  Upper abdomen: No significant findings in the visualized upper abdomen.  Musculoskeletal/Chest wall: No chest wall mass or suspicious osseous findings within the visualized chest.  IMPRESSION: Aortic Atherosclerosis (ICD10-I70.0).   Electronically Signed By: Oneil Devonshire M.D. On: 06/30/2024 01:31  Narrative CLINICAL DATA:  Cardiovascular Disease Risk stratification  EXAM: Coronary Calcium  Score  TECHNIQUE: A gated, non-contrast computed tomography scan of the heart was performed using 3mm slice  thickness. Axial images were analyzed on a dedicated workstation. Calcium  scoring of the coronary arteries was performed using the Agatston method.  FINDINGS: Coronary arteries: Normal origins.  Coronary Calcium  Score:  Left main: 0  Left anterior descending artery: 125  Left circumflex artery: 40.1  Right coronary artery: 2.8  Total: 167  Percentile: 97th  Pericardium: Normal.  Ascending Aorta: Normal caliber.  Non-cardiac: See separate report from [facility].  IMPRESSION: Coronary calcium  score of 167. This was 97th percentile for age-, race-, and sex-matched controls.  RECOMMENDATIONS: Coronary artery calcium  (CAC) score is a strong predictor of incident coronary heart disease (CHD) and provides predictive information beyond traditional risk factors. CAC scoring is reasonable to use in the decision to withhold, postpone, or initiate statin therapy in intermediate-risk or selected borderline-risk asymptomatic adults (age 21-75 years and LDL-C >=70 to <190 mg/dL) who do not have diabetes or established atherosclerotic cardiovascular disease (ASCVD).* In intermediate-risk (10-year ASCVD risk >=7.5% to <20%) adults or selected borderline-risk (10-year ASCVD risk >=5% to <7.5%) adults in whom a CAC score is measured for the purpose of making a treatment decision the following recommendations have been made:  If CAC=0, it is reasonable to withhold statin therapy and reassess in 5 to 10 years, as long as higher risk conditions are absent (diabetes mellitus, family history of premature CHD in first degree relatives (males <55 years; females <65 years), cigarette smoking, or LDL >=190 mg/dL).  If CAC is 1 to 99, it is reasonable to initiate statin therapy for patients >=67 years of age.  If CAC is >=100 or >=75th percentile, it is reasonable to initiate statin therapy at any age.  Cardiology referral should be considered for patients with CAC scores >=400  or >=75th percentile.  *2018 AHA/ACC/AACVPR/AAPA/ABC/ACPM/ADA/AGS/APhA/ASPC/NLA/PCNA Guideline on the Management of Blood Cholesterol: A Report of the American College of Cardiology/American Heart Association Task Force on Clinical Practice Guidelines. J Am Coll Cardiol. 2019;73(24):3168-3209.  Wilbert Bihari, MD  Electronically Signed: By: Wilbert Bihari M.D. On: 06/23/2024 21:04     ______________________________________________________________________________________________      Risk Assessment/Calculations          Physical Exam VS:  BP 104/68 (BP Location: Left Arm, Patient Position: Sitting, Cuff Size: Large)   Pulse 67   Ht 5' 3 (1.6 m)   Wt 189 lb 3.2 oz (85.8 kg)   LMP 07/10/2011   SpO2 97%   BMI 33.52 kg/m        Wt Readings from Last 3 Encounters:  07/30/24 190 lb (86.2 kg)  07/28/24 189 lb 3.2 oz (85.8 kg)  07/20/24 190 lb (86.2 kg)    GEN: Well nourished, well developed in no acute distress NECK: No JVD; No carotid bruits CARDIAC: RRR, no murmurs, rubs, gallops RESPIRATORY:  Clear to auscultation without rales, wheezing or rhonchi  ABDOMEN: Soft, non-tender, non-distended EXTREMITIES:  No edema; No deformity   ASSESSMENT AND PLAN  CAD: Recently underwent cardiac catheterization that showed 75% mid to distal RCA lesion treated with Synergy 4.0 x 20 mm DES, patient has 30% distal RCA residual.  Since leaving the Cath Lab, patient returned to the ER with atypical chest pain.  Serial troponin was elevated however was downtrending, likely from the recent injury coronary intervention.  Repeat echocardiogram was reassuring.  Hypertension: Blood pressure borderline low, patient has been very fatigued.  I will discontinue her amlodipine  and low-dose metoprolol .  Continue losartan -HCTZ.  Hyperlipidemia: Patient wished to be referred to nutritionist to discuss healthy eating given recent diagnosis of CAD.  Continue rosuvastatin.    Cardiac Rehabilitation  Eligibility Assessment  The patient is ready to start cardiac rehabilitation from a cardiac standpoint.       Dispo: Follow-up with Dr. Anner in 3 months  Signed, Pavle Wiler, [place]

## 2024-07-28 NOTE — Patient Instructions (Addendum)
 Medication Instructions:  STOP AMLODIPINE  STOP METOPROLOL TARTRATE  *If you need a refill on your cardiac medications before your next appointment, please call your pharmacy*  Lab Work: NO LABS If you have labs (blood work) drawn today and your tests are completely normal, you will receive your results only by: MyChart Message (if you have MyChart) OR A paper copy in the mail If you have any lab test that is abnormal or we need to change your treatment, we will call you to review the results.  Testing/Procedures: NO TESTING  Follow-Up: At [facility], you and your health needs are our priority.  As part of our continuing mission to provide you with exceptional heart care, our providers are all part of one team.  This team includes your primary Cardiologist (physician) and Advanced Practice Providers or APPs (Physician Assistants and Nurse Practitioners) who all work together to provide you with the care you need, when you need it.  Your next appointment:   3 month(s)  Provider:   Alm Clay, MD    Other Instructions You have been referred to nutrionist  Cardiac rehab [phone]) [phone]

## 2024-07-30 ENCOUNTER — Encounter: Payer: Self-pay | Admitting: Dietician

## 2024-07-30 ENCOUNTER — Encounter: Attending: Physician Assistant | Admitting: Dietician

## 2024-07-30 VITALS — Ht 63.0 in | Wt 190.0 lb

## 2024-07-30 DIAGNOSIS — I25119 Atherosclerotic heart disease of native coronary artery with unspecified angina pectoris: Secondary | ICD-10-CM | POA: Diagnosis not present

## 2024-07-30 NOTE — Progress Notes (Unsigned)
 Medical Nutrition Therapy  Appointment Start time:  9:43 AM  Appointment End time:  1050  Primary concerns today: 90% blockage in coronary artery  Referral diagnosis: CAD Preferred learning style: no preference indicated Learning readiness: ready, change in progress   NUTRITION ASSESSMENT   63 inches, 190lbs (07/30/24)  Pt reports wt was ~200lbs 2 weeks prior. She is weighing herself weekly.   Clinical Medical Hx: CAD, Stent placed on 07/20/24. HLD. HTN. Thyroidectomy for pre-cancerous nodules.Pre-diabetes. Iron-deficiency anemia.  Medications: Crestor, Levothyroxine , Liothyronine , Plavix  Labs: Pt reports calcium  score was 167 2-3 weeks ago.   Notable Signs/Symptoms: increased fatigue  Lifestyle & Dietary Hx Lives with husband. Pt reports husband does the shopping and cooking in the home. Pt reports making different dietary choices than her husband who is eating foods higher in saturated fats. She is interested in learning new recipes.  She has a friend who has told her about Dr. Esselstyn (reversing heart disease) and has been given their cookbook.  Pt reports she is often very fatigued and is currently not working due to increased exhaustion. She works in audiological scientist.  Pt reports she has a garden and grows increased amounts of vegetables.  Food Allergy: Coconut  Estimated daily fluid intake:  oz Supplements: MVI, fiber Sleep: Pt states she slept 18hrs/day the day prior d/t feeling fatigued Stress / self-care: Pt reports low stress. Current average weekly physical activity: Enjoys hiking. Basic ADLs most days but also walks around her neighborhood. Pt reports trying to walk at least 8000 steps per day.  24-Hr Dietary Recall First Meal: Scrambled eggs, banana Snack: none Second Meal: shrimp, zucchini noodles, lemon sauce (no cream) Snack: mixed nuts Third Meal: rotisserie chicken Snack: none Beverages: Water , low-carb beer a few nights/week  NUTRITION DIAGNOSIS  NB-1.1  Food and nutrition-related knowledge deficit As related to diet for CAD.  As evidenced by patient report.   NUTRITION INTERVENTION  Nutrition education (E-1) on the following topics:  Types of fat and negative impact of saturated fat Benefits of increased plants Healthy prepared meal options    Handouts Provided Include  [employer] Developer of Lifestyle Medicine) packet Snack list Cholesterol & Triglycerides Types of fat  Learning Style & Readiness for Change Teaching method utilized: Visual & Auditory  Demonstrated degree of understanding via: Teach Back  Barriers to learning/adherence to lifestyle change: none  Goals Established by Pt Increased intake of vegetables particularly greens Continue to follow a low saturated fat diet  Meal Delivery options:  (frozen, preprepared, or kits) [employer].com  (advertises to see if you qualify for insurance coverage of the meals) [employer].com  [phone] (25% off with cod RD25 and pop up for 50% off) Whole [email or web address]://[email or web address].boyer-richardson.[email or web address]/ (Whole foods plant based) Hungry Root Purple Carrot (vegan) Other companies can be found on line Leafside (vegan) - does not require refrigeration so great for travel and backpacking Nourishedrx.com Diettogo - easy menu personalization, no prep, no cooking Hello Fresh   MONITORING & EVALUATION Dietary intake, weekly physical activity, and *** in ***.  Next Steps  Patient is to ***.

## 2024-07-30 NOTE — Patient Instructions (Addendum)
 Meal Delivery options:  (frozen, preprepared, or kits) [employer]  (advertises to see if you qualify for insurance coverage of the meals) [employer]  [phone] (25% off with cod [address] and pop up for 50% off) Whole [email or web address] (Whole foods plant based) [employer] (vegan) Other companies can be found on line [employer] (vegan) - does not require refrigeration so great for travel and backpacking [email or web address] - easy menu personalization, no prep, no cooking Hello Fresh

## 2024-08-03 DIAGNOSIS — D649 Anemia, unspecified: Secondary | ICD-10-CM | POA: Diagnosis not present

## 2024-08-10 ENCOUNTER — Ambulatory Visit: Admitting: Physician Assistant

## 2024-08-10 ENCOUNTER — Telehealth (HOSPITAL_COMMUNITY): Payer: Self-pay

## 2024-08-10 NOTE — Telephone Encounter (Signed)
 Called patient to schedule cardiac rehab. Since patient is not able to start work until she begins cardiac rehab she does not want to wait until the beginning of January to start. Gave her the numbers for [facility], she will see if they have sooner availability and call us  to tell us  where to send her referral.

## 2024-08-10 NOTE — Telephone Encounter (Signed)
 Patient called back stating she would like her referral sent to [facility].

## 2024-08-11 DIAGNOSIS — G5603 Carpal tunnel syndrome, bilateral upper limbs: Secondary | ICD-10-CM | POA: Diagnosis not present

## 2024-08-12 ENCOUNTER — Other Ambulatory Visit: Payer: Self-pay

## 2024-08-12 ENCOUNTER — Encounter

## 2024-08-12 ENCOUNTER — Encounter: Payer: Self-pay | Admitting: Emergency Medicine

## 2024-08-12 VITALS — Ht 64.5 in | Wt 186.6 lb

## 2024-08-12 DIAGNOSIS — Z955 Presence of coronary angioplasty implant and graft: Secondary | ICD-10-CM | POA: Insufficient documentation

## 2024-08-12 DIAGNOSIS — Z48812 Encounter for surgical aftercare following surgery on the circulatory system: Secondary | ICD-10-CM | POA: Diagnosis not present

## 2024-08-12 NOTE — Progress Notes (Signed)
 Cardiac Individual Treatment Plan  Patient Details  Name: Doris Davis MRN: [id number] Date of Birth: 1970/12/03 Referring Provider:   Flowsheet Row Cardiac Rehab from 08/12/2024 in [facility] Cardiac and Pulmonary Rehab  Referring Provider Dr. Jill Clay    Initial Encounter Date:  Flowsheet Row Cardiac Rehab from 08/12/2024 in [facility] Cardiac and Pulmonary Rehab  Date 08/12/24    Visit Diagnosis: Status post coronary artery stent placement  Patient's Home Medications on Admission:  Current Outpatient Medications:    acetaminophen  (TYLENOL ) 500 MG tablet, Take 500 mg by mouth daily as needed for moderate pain (pain score 4-6), mild pain (pain score 1-3) or headache., Disp: , Rfl:    ALPRAZolam  (XANAX ) 0.25 MG tablet, Take 0.25 mg by mouth daily as needed for anxiety., Disp: , Rfl:    aspirin  EC 81 MG tablet, Take 81 mg by mouth daily. Swallow whole., Disp: , Rfl:    buPROPion  (WELLBUTRIN  XL) 300 MG 24 hr tablet, Take 300 mg by mouth daily., Disp: , Rfl: 0   clopidogrel  (PLAVIX ) 75 MG tablet, Take 1 tablet (75 mg total) by mouth daily., Disp: 30 tablet, Rfl: 11   levothyroxine  (SYNTHROID ) 112 MCG tablet, Take 112 mcg by mouth daily before breakfast., Disp: , Rfl:    liothyronine  (CYTOMEL ) 5 MCG tablet, Take 5 mcg by mouth daily., Disp: , Rfl:    losartan -hydrochlorothiazide  (HYZAAR) 100-25 MG tablet, Take 1 tablet by mouth daily., Disp: , Rfl:    montelukast (SINGULAIR) 10 MG tablet, Take 10 mg by mouth daily as needed (allergies)., Disp: , Rfl:    Multiple Vitamin (MULTIVITAMIN) capsule, Take 1 capsule by mouth daily. One a day 50+, Disp: , Rfl:    nitroGLYCERIN  (NITROSTAT ) 0.4 MG SL tablet, Place 1 tablet (0.4 mg total) under the tongue every 5 (five) minutes as needed for chest pain. (Patient not taking: Reported on 07/30/2024), Disp: 25 tablet, Rfl: 2   rosuvastatin (CRESTOR) 20 MG tablet, Take 20 mg by mouth daily., Disp: , Rfl:   Past Medical History: Past Medical History:   Diagnosis Date   Allergy    Anemia    Anxiety    Arthritis    Asthma    with contact with cats   BV (bacterial vaginosis) 08/09/2013   Family history of adverse reaction to anesthesia    mother was sick for days   Hyperlipidemia    Hypertension    PONV (postoperative nausea and vomiting)    sts with ablation she was given meds for nausea and got sicker.   Vaginal bleeding 08/09/2013   Had supra cervical hyst 2012, has had bleeding after sex   Venereal disease 1990   chlamydia   Wears glasses     Tobacco Use: Social History   Tobacco Use  Smoking Status Former   Current packs/day: 0.00   Average packs/day: 0.3 packs/day for 20.0 years (5.0 ttl pk-yrs)   Types: Cigarettes   Start date: 12/25/1993   Quit date: 12/25/2013   Years since quitting: 10.6  Smokeless Tobacco Never    Labs: Review Flowsheet       Latest Ref Rng & Units 04/12/2013  Labs for ITP Cardiac and Pulmonary Rehab  TCO2 0 - 100 mmol/L 24      Exercise Target Goals: Exercise Program Goal: Individual exercise prescription set using results from initial 6 min walk test and THRR while considering  patient's activity barriers and safety.   Exercise Prescription Goal: Initial exercise prescription builds to 30-45 minutes a day  of aerobic activity, 2-3 days per week.  Home exercise guidelines will be given to patient during program as part of exercise prescription that the participant will acknowledge.   Education: Aerobic Exercise: - Group verbal and visual presentation on the components of exercise prescription. Introduces F.I.T.T principle from ACSM for exercise prescriptions.  Reviews F.I.T.T. principles of aerobic exercise including progression. Written material provided at class time.   Education: Resistance Exercise: - Group verbal and visual presentation on the components of exercise prescription. Introduces F.I.T.T principle from ACSM for exercise prescriptions  Reviews F.I.T.T. principles of  resistance exercise including progression. Written material provided at class time.    Education: Exercise & Equipment Safety: - Individual verbal instruction and demonstration of equipment use and safety with use of the equipment. Flowsheet Row Cardiac Rehab from 08/12/2024 in [facility] Cardiac and Pulmonary Rehab  Date 08/12/24  Educator [facility]  Instruction Review Code 1- Verbalizes Understanding    Education: Exercise Physiology & General Exercise Guidelines: - Group verbal and written instruction with models to review the exercise physiology of the cardiovascular system and associated critical values. Provides general exercise guidelines with specific guidelines to those with heart or lung disease. Written material provided at class time.   Education: Flexibility, Balance, Mind/Body Relaxation: - Group verbal and visual presentation with interactive activity on the components of exercise prescription. Introduces F.I.T.T principle from ACSM for exercise prescriptions. Reviews F.I.T.T. principles of flexibility and balance exercise training including progression. Also discusses the mind body connection.  Reviews various relaxation techniques to help reduce and manage stress (i.e. Deep breathing, progressive muscle relaxation, and visualization). Balance handout provided to take home. Written material provided at class time.   Activity Barriers & Risk Stratification:  Activity Barriers & Cardiac Risk Stratification - 08/12/24 1444       Activity Barriers & Cardiac Risk Stratification   Activity Barriers Other (comment);Back Problems    Comments right trigger finger, spinal stenosis    Cardiac Risk Stratification Moderate          6 Minute Walk:  6 Minute Walk     Row Name 08/12/24 1646         6 Minute Walk   Phase Initial     Distance 1560 feet     Walk Time 6 minutes     # of Rest Breaks 0     MPH 2.95     METS 4.1     RPE 9     Perceived Dyspnea  0     VO2 Peak 14.37      Symptoms No     Resting HR 75 bpm     Resting BP 122/74     Resting Oxygen Saturation  96 %     Exercise Oxygen Saturation  during 6 min walk 99 %     Max Ex. HR 118 bpm     Max Ex. BP 134/76     2 Minute Post BP 126/76        Oxygen Initial Assessment:   Oxygen Re-Evaluation:   Oxygen Discharge (Final Oxygen Re-Evaluation):   Initial Exercise Prescription:  Initial Exercise Prescription - 08/12/24 1600       Date of Initial Exercise RX and Referring Provider   Date 08/12/24    Referring Provider Dr. Davod Harding      Oxygen   Maintain Oxygen Saturation 88% or higher      Treadmill   MPH 3    Grade 0    Minutes 15  METs 3.3      REL-XR   Level 3    Watts 25    Speed 50    Minutes 15    METs 4.1      Biostep-RELP   Level 3    SPM 50    Minutes 15    METs 4.1      Track   Laps 42    Minutes 15    METs 3.28      Prescription Details   Duration Progress to 30 minutes of continuous aerobic without signs/symptoms of physical distress      Intensity   THRR 40-80% of Max Heartrate 111-147    Ratings of Perceived Exertion 11-13    Perceived Dyspnea 0-4      Progression   Progression Continue to progress workloads to maintain intensity without signs/symptoms of physical distress.      Resistance Training   Training Prescription Yes    Weight 4lb    Reps 10-15          Perform Capillary Blood Glucose checks as needed.  Exercise Prescription Changes:   Exercise Prescription Changes     Row Name 08/12/24 1600             Response to Exercise   Blood Pressure (Admit) 122/74       Blood Pressure (Exercise) 134/76       Blood Pressure (Exit) 126/76       Heart Rate (Admit) 75 bpm       Heart Rate (Exercise) 118 bpm       Heart Rate (Exit) 92 bpm       Oxygen Saturation (Admit) 96 %       Oxygen Saturation (Exercise) 99 %       Oxygen Saturation (Exit) 99 %       Rating of Perceived Exertion (Exercise) 9       Perceived Dyspnea  (Exercise) 0       Symptoms none       Comments results          Exercise Comments:   Exercise Goals and Review:   Exercise Goals     Row Name 08/12/24 1650             Exercise Goals   Increase Physical Activity Yes       Intervention Provide advice, education, support and counseling about physical activity/exercise needs.;Develop an individualized exercise prescription for aerobic and resistive training based on initial evaluation findings, risk stratification, comorbidities and participant's personal goals.       Expected Outcomes Short Term: Attend rehab on a regular basis to increase amount of physical activity.;Long Term: Add in home exercise to make exercise part of routine and to increase amount of physical activity.;Long Term: Exercising regularly at least 3-5 days a week.       Increase Strength and Stamina Yes       Intervention Provide advice, education, support and counseling about physical activity/exercise needs.;Develop an individualized exercise prescription for aerobic and resistive training based on initial evaluation findings, risk stratification, comorbidities and participant's personal goals.       Expected Outcomes Short Term: Increase workloads from initial exercise prescription for resistance, speed, and METs.;Short Term: Perform resistance training exercises routinely during rehab and add in resistance training at home;Long Term: Improve cardiorespiratory fitness, muscular endurance and strength as measured by increased METs and functional capacity ( )       Able to understand and use rate  of perceived exertion (RPE) scale Yes       Intervention Provide education and explanation on how to use RPE scale       Expected Outcomes Short Term: Able to use RPE daily in rehab to express subjective intensity level;Long Term:  Able to use RPE to guide intensity level when exercising independently       Able to understand and use Dyspnea scale Yes        Intervention Provide education and explanation on how to use Dyspnea scale       Expected Outcomes Short Term: Able to use Dyspnea scale daily in rehab to express subjective sense of shortness of breath during exertion;Long Term: Able to use Dyspnea scale to guide intensity level when exercising independently       Knowledge and understanding of Target Heart Rate Range (THRR) Yes       Intervention Provide education and explanation of THRR including how the numbers were predicted and where they are located for reference       Expected Outcomes Short Term: Able to state/look up THRR;Long Term: Able to use THRR to govern intensity when exercising independently;Short Term: Able to use daily as guideline for intensity in rehab       Able to check pulse independently Yes       Intervention Provide education and demonstration on how to check pulse in carotid and radial arteries.;Review the importance of being able to check your own pulse for safety during independent exercise       Expected Outcomes Short Term: Able to explain why pulse checking is important during independent exercise;Long Term: Able to check pulse independently and accurately       Understanding of Exercise Prescription Yes       Intervention Provide education, explanation, and written materials on patient's individual exercise prescription       Expected Outcomes Short Term: Able to explain program exercise prescription;Long Term: Able to explain home exercise prescription to exercise independently          Exercise Goals Re-Evaluation :   Discharge Exercise Prescription (Final Exercise Prescription Changes):  Exercise Prescription Changes - 08/12/24 1600       Response to Exercise   Blood Pressure (Admit) 122/74    Blood Pressure (Exercise) 134/76    Blood Pressure (Exit) 126/76    Heart Rate (Admit) 75 bpm    Heart Rate (Exercise) 118 bpm    Heart Rate (Exit) 92 bpm    Oxygen Saturation (Admit) 96 %    Oxygen Saturation  (Exercise) 99 %    Oxygen Saturation (Exit) 99 %    Rating of Perceived Exertion (Exercise) 9    Perceived Dyspnea (Exercise) 0    Symptoms none    Comments results          Nutrition:  Target Goals: Understanding of nutrition guidelines, daily intake of sodium 1500mg , cholesterol 200mg , calories 30% from fat and 7% or less from saturated fats, daily to have 5 or more servings of fruits and vegetables.  Education: Nutrition 1 -Group instruction provided by verbal, written material, interactive activities, discussions, models, and posters to present general guidelines for heart healthy nutrition including macronutrients, label reading, and promoting whole foods over processed counterparts. Education serves as pensions consultant of discussion of heart healthy eating for all. Written material provided at class time.    Education: Nutrition 2 -Group instruction provided by verbal, written material, interactive activities, discussions, models, and posters to present general guidelines for  heart healthy nutrition including sodium, cholesterol, and saturated fat. Providing guidance of habit forming to improve blood pressure, cholesterol, and body weight. Written material provided at class time.     Biometrics:  Pre Biometrics - 08/12/24 1651       Pre Biometrics   Height 5' 4.5 (1.638 m)    Weight 186 lb 9.6 oz (84.6 kg)    Waist Circumference 38.5 inches    Hip Circumference 42.5 inches    Waist to Hip Ratio 0.91 %    BMI (Calculated) 31.55    Single Leg Stand 15 seconds           Nutrition Therapy Plan and Nutrition Goals:   Nutrition Assessments:  MEDIFICTS Score Key: >=70 Need to make dietary changes  40-70 Heart Healthy Diet <= 40 Therapeutic Level Cholesterol Diet  Flowsheet Row Cardiac Rehab from 08/12/2024 in [facility] Cardiac and Pulmonary Rehab  Picture Your Plate Total Score on Admission 76   Picture Your Plate Scores: <59 Unhealthy dietary pattern with much  room for improvement. 41-50 Dietary pattern unlikely to meet recommendations for good health and room for improvement. 51-60 More healthful dietary pattern, with some room for improvement.  >60 Healthy dietary pattern, although there may be some specific behaviors that could be improved.    Nutrition Goals Re-Evaluation:   Nutrition Goals Discharge (Final Nutrition Goals Re-Evaluation):   Psychosocial: Target Goals: Acknowledge presence or absence of significant depression and/or stress, maximize coping skills, provide positive support system. Participant is able to verbalize types and ability to use techniques and skills needed for reducing stress and depression.   Education: Stress, Anxiety, and Depression - Group verbal and visual presentation to define topics covered.  Reviews how body is impacted by stress, anxiety, and depression.  Also discusses healthy ways to reduce stress and to treat/manage anxiety and depression. Written material provided at class time.   Education: Sleep Hygiene -Provides group verbal and written instruction about how sleep can affect your health.  Define sleep hygiene, discuss sleep cycles and impact of sleep habits. Review good sleep hygiene tips.   Initial Review & Psychosocial Screening:  Initial Psych Review & Screening - 08/12/24 1452       Initial Review   Current issues with Current Depression;Current Psychotropic Meds;Current Stress Concerns;Current Sleep Concerns    Source of Stress Concerns Unable to perform yard/household activities;Occupation      Family Dynamics   Good Support System? Yes   husband and children     Barriers   Psychosocial barriers to participate in program The patient should benefit from training in stress management and relaxation.;There are no identifiable barriers or psychosocial needs.      Screening Interventions   Interventions Encouraged to exercise;To provide support and resources with identified psychosocial  needs;Provide feedback about the scores to participant    Expected Outcomes Short Term goal: Utilizing psychosocial counselor, staff and physician to assist with identification of specific Stressors or current issues interfering with healing process. Setting desired goal for each stressor or current issue identified.;Long Term Goal: Stressors or current issues are controlled or eliminated.;Short Term goal: Identification and review with participant of any Quality of Life or Depression concerns found by scoring the questionnaire.;Long Term goal: The participant improves quality of Life and PHQ9 Scores as seen by post scores and/or verbalization of changes          Quality of Life Scores:   Quality of Life - 08/12/24 1635       Quality  of Life   Select Quality of Life      Quality of Life Scores   Health/Function Pre 13.83 %    Socioeconomic Pre 24.31 %    Psych/Spiritual Pre 16.5 %    Family Pre 26.4 %    GLOBAL Pre 18.56 %         Scores of 19 and below usually indicate a poorer quality of life in these areas.  A difference of  2-3 points is a clinically meaningful difference.  A difference of 2-3 points in the total score of the Quality of Life Index has been associated with significant improvement in overall quality of life, self-image, physical symptoms, and general health in studies assessing change in quality of life.  PHQ-9: Review Flowsheet       08/12/2024 07/30/2024  Depression screen PHQ 2/9  Decreased Interest 1 0  Down, Depressed, Hopeless 2 0  PHQ - 2 Score 3 0  Altered sleeping 3 -  Tired, decreased energy 3 -  Change in appetite 0 -  Feeling bad or failure about yourself  1 -  Trouble concentrating 2 -  Moving slowly or fidgety/restless 0 -  Suicidal thoughts 0 -  PHQ-9 Score 12 -  Difficult doing work/chores Somewhat difficult -   Interpretation of Total Score  Total Score Depression Severity:  1-4 = Minimal depression, 5-9 = Mild depression, 10-14 =  Moderate depression, 15-19 = Moderately severe depression, 20-27 = Severe depression   Psychosocial Evaluation and Intervention:  Psychosocial Evaluation - 08/12/24 1557       Psychosocial Evaluation & Interventions   Interventions Stress management education;Relaxation education;Encouraged to exercise with the program and follow exercise prescription    Comments Jora is excited to start the program as she had an unexpect cath with stent placement 3 weeks ago. She works full time as an airline pilot and per her doctor, must succesfully complete 3 weeks of rehab prior to starting back at work. Farzana lives with her husband and one child and states that she also has 2 children that live outside of the home and that her entire family has been very supportive during this event. Cailin does have a history of depression and is currently on medication for same. She states that she enjoys reading, hiking and gardening to decompress. She does admit that her depression is worse this time of year and given the fact that she has decreased energy level following her cardiac event.    Expected Outcomes Short: Attend cardiac rehab for education and exercise. Long: Develop and maintain positive self care habits.    Continue Psychosocial Services  Follow up required by staff          Psychosocial Re-Evaluation:   Psychosocial Discharge (Final Psychosocial Re-Evaluation):   Vocational Rehabilitation: Provide vocational rehab assistance to qualifying candidates.   Vocational Rehab Evaluation & Intervention:  Vocational Rehab - 08/12/24 1606       Initial Vocational Rehab Evaluation & Intervention   Assessment shows need for Vocational Rehabilitation No          Education: Education Goals: Education classes will be provided on a variety of topics geared toward better understanding of heart health and risk factor modification. Participant will state understanding/return demonstration of topics presented  as noted by education test scores.  Learning Barriers/Preferences:  Learning Barriers/Preferences - 08/12/24 1605       Learning Barriers/Preferences   Learning Barriers None    Learning Preferences Group Instruction;Individual Instruction;Pictoral;Skilled Demonstration;Verbal Instruction  General Cardiac Education Topics:  AED/CPR: - Group verbal and written instruction with the use of models to demonstrate the basic use of the AED with the basic ABC's of resuscitation.   Test and Procedures: - Group verbal and visual presentation and models provide information about basic cardiac anatomy and function. Reviews the testing methods done to diagnose heart disease and the outcomes of the test results. Describes the treatment choices: Medical Management, Angioplasty, or Coronary Bypass Surgery for treating various heart conditions including Myocardial Infarction, Angina, Valve Disease, and Cardiac Arrhythmias. Written material provided at class time.   Medication Safety: - Group verbal and visual instruction to review commonly prescribed medications for heart and lung disease. Reviews the medication, class of the drug, and side effects. Includes the steps to properly store meds and maintain the prescription regimen. Written material provided at class time.   Intimacy: - Group verbal instruction through game format to discuss how heart and lung disease can affect sexual intimacy. Written material provided at class time.   Know Your Numbers and Heart Failure: - Group verbal and visual instruction to discuss disease risk factors for cardiac and pulmonary disease and treatment options.  Reviews associated critical values for Overweight/Obesity, Hypertension, Cholesterol, and Diabetes.  Discusses basics of heart failure: signs/symptoms and treatments.  Introduces Heart Failure Zone chart for action plan for heart failure. Written material provided at class time.   Infection  Prevention: - Provides verbal and written material to individual with discussion of infection control including proper hand washing and proper equipment cleaning during exercise session. Flowsheet Row Cardiac Rehab from 08/12/2024 in [facility] Cardiac and Pulmonary Rehab  Date 08/12/24  Educator [facility]  Instruction Review Code 1- Verbalizes Understanding    Falls Prevention: - Provides verbal and written material to individual with discussion of falls prevention and safety. Flowsheet Row Cardiac Rehab from 08/12/2024 in [facility] Cardiac and Pulmonary Rehab  Date 08/12/24  Educator [facility]  Instruction Review Code 1- Verbalizes Understanding    Other: -Provides group and verbal instruction on various topics (see comments)   Knowledge Questionnaire Score:   Core Components/Risk Factors/Patient Goals at Admission:  Personal Goals and Risk Factors at Admission - 08/12/24 1604       Core Components/Risk Factors/Patient Goals on Admission    Weight Management Yes;Weight Loss    Intervention Weight Management: Develop a combined nutrition and exercise program designed to reach desired caloric intake, while maintaining appropriate intake of nutrient and fiber, sodium and fats, and appropriate energy expenditure required for the weight goal.;Weight Management: Provide education and appropriate resources to help participant work on and attain dietary goals.;Weight Management/Obesity: Establish reasonable short term and long term weight goals.;Obesity: Provide education and appropriate resources to help participant work on and attain dietary goals.    Goal Weight: Long Term 150 lb (68 kg)    Expected Outcomes Short Term: Continue to assess and modify interventions until short term weight is achieved;Long Term: Adherence to nutrition and physical activity/exercise program aimed toward attainment of established weight goal;Weight Maintenance: Understanding of the daily nutrition guidelines, which includes 25-35%  calories from fat, 7% or less cal from saturated fats, less than 200mg  cholesterol, less than 1.5gm of sodium, & 5 or more servings of fruits and vegetables daily;Weight Loss: Understanding of general recommendations for a balanced deficit meal plan, which promotes 1-2 lb weight loss per week and includes a negative energy balance of [phone] kcal/d;Understanding recommendations for meals to include 15-35% energy as protein, 25-35% energy from fat, 35-60%  energy from carbohydrates, less than 200mg  of dietary cholesterol, 20-35 gm of total fiber daily;Understanding of distribution of calorie intake throughout the day with the consumption of 4-5 meals/snacks    Hypertension Yes    Intervention Provide education on lifestyle modifcations including regular physical activity/exercise, weight management, moderate sodium restriction and increased consumption of fresh fruit, vegetables, and low fat dairy, alcohol moderation, and smoking cessation.;Monitor prescription use compliance.    Expected Outcomes Short Term: Continued assessment and intervention until BP is < 140/74mm HG in hypertensive participants. < 130/7mm HG in hypertensive participants with diabetes, heart failure or chronic kidney disease.;Long Term: Maintenance of blood pressure at goal levels.    Lipids Yes    Intervention Provide education and support for participant on nutrition & aerobic/resistive exercise along with prescribed medications to achieve LDL 70mg , HDL >40mg .    Expected Outcomes Short Term: Participant states understanding of desired cholesterol values and is compliant with medications prescribed. Participant is following exercise prescription and nutrition guidelines.;Long Term: Cholesterol controlled with medications as prescribed, with individualized exercise RX and with personalized nutrition plan. Value goals: LDL < 70mg , HDL > 40 mg.          Education:Diabetes - Individual verbal and written instruction to review  signs/symptoms of diabetes, desired ranges of glucose level fasting, after meals and with exercise. Acknowledge that pre and post exercise glucose checks will be done for 3 sessions at entry of program.   Core Components/Risk Factors/Patient Goals Review:    Core Components/Risk Factors/Patient Goals at Discharge (Final Review):    ITP Comments:  ITP Comments     Row Name 08/12/24 1550           ITP Comments Completed program orientation and . Initial ITP created and sent for review to Medical Director.          Comments: Initial ITP

## 2024-08-12 NOTE — Patient Instructions (Signed)
 Patient Instructions  Patient Details  Name: Doris Davis MRN: [id number] Date of Birth: February 11, 1971 Referring Provider:  Anner Alm ORN, MD  Below are your personal goals for exercise, nutrition, and risk factors. Our goal is to help you stay on track towards obtaining and maintaining these goals. We will be discussing your progress on these goals with you throughout the program.  Initial Exercise Prescription:  Initial Exercise Prescription - 08/12/24 1600       Date of Initial Exercise RX and Referring Provider   Date 08/12/24    Referring Provider Dr. Jill Anner      Oxygen   Maintain Oxygen Saturation 88% or higher      Treadmill   MPH 3    Grade 0    Minutes 15    METs 3.3      REL-XR   Level 3    Watts 25    Speed 50    Minutes 15    METs 4.1      Biostep-RELP   Level 3    SPM 50    Minutes 15    METs 4.1      Track   Laps 42    Minutes 15    METs 3.28      Prescription Details   Duration Progress to 30 minutes of continuous aerobic without signs/symptoms of physical distress      Intensity   THRR 40-80% of Max Heartrate 111-147    Ratings of Perceived Exertion 11-13    Perceived Dyspnea 0-4      Progression   Progression Continue to progress workloads to maintain intensity without signs/symptoms of physical distress.      Resistance Training   Training Prescription Yes    Weight 4lb    Reps 10-15          Exercise Goals: Frequency: Be able to perform aerobic exercise two to three times per week in program working toward 2-5 days per week of home exercise.  Intensity: Work with a perceived exertion of 11 (fairly light) - 15 (hard) while following your exercise prescription.  We will make changes to your prescription with you as you progress through the program.   Duration: Be able to do 30 to 45 minutes of continuous aerobic exercise in addition to a 5 minute warm-up and a 5 minute cool-down routine.   Nutrition Goals: Your personal  nutrition goals will be established when you do your nutrition analysis with the dietician.  The following are general nutrition guidelines to follow: Cholesterol < 200mg /day Sodium < 1500mg /day Fiber: Women over 50 yrs - 21 grams per day  Personal Goals:  Personal Goals and Risk Factors at Admission - 08/12/24 1604       Core Components/Risk Factors/Patient Goals on Admission    Weight Management Yes;Weight Loss    Intervention Weight Management: Develop a combined nutrition and exercise program designed to reach desired caloric intake, while maintaining appropriate intake of nutrient and fiber, sodium and fats, and appropriate energy expenditure required for the weight goal.;Weight Management: Provide education and appropriate resources to help participant work on and attain dietary goals.;Weight Management/Obesity: Establish reasonable short term and long term weight goals.;Obesity: Provide education and appropriate resources to help participant work on and attain dietary goals.    Goal Weight: Long Term 150 lb (68 kg)    Expected Outcomes Short Term: Continue to assess and modify interventions until short term weight is achieved;Long Term: Adherence to nutrition and physical activity/exercise  program aimed toward attainment of established weight goal;Weight Maintenance: Understanding of the daily nutrition guidelines, which includes 25-35% calories from fat, 7% or less cal from saturated fats, less than 200mg  cholesterol, less than 1.5gm of sodium, & 5 or more servings of fruits and vegetables daily;Weight Loss: Understanding of general recommendations for a balanced deficit meal plan, which promotes 1-2 lb weight loss per week and includes a negative energy balance of [phone] kcal/d;Understanding recommendations for meals to include 15-35% energy as protein, 25-35% energy from fat, 35-60% energy from carbohydrates, less than 200mg  of dietary cholesterol, 20-35 gm of total fiber  daily;Understanding of distribution of calorie intake throughout the day with the consumption of 4-5 meals/snacks    Hypertension Yes    Intervention Provide education on lifestyle modifcations including regular physical activity/exercise, weight management, moderate sodium restriction and increased consumption of fresh fruit, vegetables, and low fat dairy, alcohol moderation, and smoking cessation.;Monitor prescription use compliance.    Expected Outcomes Short Term: Continued assessment and intervention until BP is < 140/74mm HG in hypertensive participants. < 130/45mm HG in hypertensive participants with diabetes, heart failure or chronic kidney disease.;Long Term: Maintenance of blood pressure at goal levels.    Lipids Yes    Intervention Provide education and support for participant on nutrition & aerobic/resistive exercise along with prescribed medications to achieve LDL 70mg , HDL >40mg .    Expected Outcomes Short Term: Participant states understanding of desired cholesterol values and is compliant with medications prescribed. Participant is following exercise prescription and nutrition guidelines.;Long Term: Cholesterol controlled with medications as prescribed, with individualized exercise RX and with personalized nutrition plan. Value goals: LDL < 70mg , HDL > 40 mg.          Exercise Goals and Review:  Exercise Goals     Row Name 08/12/24 1650             Exercise Goals   Increase Physical Activity Yes       Intervention Provide advice, education, support and counseling about physical activity/exercise needs.;Develop an individualized exercise prescription for aerobic and resistive training based on initial evaluation findings, risk stratification, comorbidities and participant's personal goals.       Expected Outcomes Short Term: Attend rehab on a regular basis to increase amount of physical activity.;Long Term: Add in home exercise to make exercise part of routine and to increase  amount of physical activity.;Long Term: Exercising regularly at least 3-5 days a week.       Increase Strength and Stamina Yes       Intervention Provide advice, education, support and counseling about physical activity/exercise needs.;Develop an individualized exercise prescription for aerobic and resistive training based on initial evaluation findings, risk stratification, comorbidities and participant's personal goals.       Expected Outcomes Short Term: Increase workloads from initial exercise prescription for resistance, speed, and METs.;Short Term: Perform resistance training exercises routinely during rehab and add in resistance training at home;Long Term: Improve cardiorespiratory fitness, muscular endurance and strength as measured by increased METs and functional capacity ( )       Able to understand and use rate of perceived exertion (RPE) scale Yes       Intervention Provide education and explanation on how to use RPE scale       Expected Outcomes Short Term: Able to use RPE daily in rehab to express subjective intensity level;Long Term:  Able to use RPE to guide intensity level when exercising independently       Able to understand and  use Dyspnea scale Yes       Intervention Provide education and explanation on how to use Dyspnea scale       Expected Outcomes Short Term: Able to use Dyspnea scale daily in rehab to express subjective sense of shortness of breath during exertion;Long Term: Able to use Dyspnea scale to guide intensity level when exercising independently       Knowledge and understanding of Target Heart Rate Range (THRR) Yes       Intervention Provide education and explanation of THRR including how the numbers were predicted and where they are located for reference       Expected Outcomes Short Term: Able to state/look up THRR;Long Term: Able to use THRR to govern intensity when exercising independently;Short Term: Able to use daily as guideline for intensity in rehab        Able to check pulse independently Yes       Intervention Provide education and demonstration on how to check pulse in carotid and radial arteries.;Review the importance of being able to check your own pulse for safety during independent exercise       Expected Outcomes Short Term: Able to explain why pulse checking is important during independent exercise;Long Term: Able to check pulse independently and accurately       Understanding of Exercise Prescription Yes       Intervention Provide education, explanation, and written materials on patient's individual exercise prescription       Expected Outcomes Short Term: Able to explain program exercise prescription;Long Term: Able to explain home exercise prescription to exercise independently          Copy of goals given to participant.

## 2024-08-13 ENCOUNTER — Other Ambulatory Visit: Payer: Self-pay | Admitting: Physician Assistant

## 2024-08-13 ENCOUNTER — Telehealth (HOSPITAL_COMMUNITY): Payer: Self-pay

## 2024-08-13 ENCOUNTER — Telehealth (HOSPITAL_COMMUNITY): Payer: Self-pay | Admitting: *Deleted

## 2024-08-13 MED ORDER — CLOPIDOGREL BISULFATE 75 MG PO TABS: 75.0000 mg | ORAL_TABLET | Freq: Every day | ORAL | 3 refills | Status: AC

## 2024-08-13 MED ORDER — METOPROLOL SUCCINATE ER 25 MG PO TB24: 25.0000 mg | ORAL_TABLET | Freq: Every day | ORAL | 3 refills | Status: AC

## 2024-08-13 NOTE — Telephone Encounter (Signed)
 I have called and spoken with Doris Davis.  The final heart monitor report is not back yet.  I was able to do wet reading of the raw report.  Overall, reassuring heart monitor result, baseline heart rate was elevated in the 90s on average, however no significant arrhythmia.  I previously took the patient off of metoprolol  tartrate due to fatigue.  She is feeling better now and willing to try metoprolol  again.  I will switch to metoprolol  succinate and have sent it to [facility] in pharmacy.  Patient has been notified.  I also sent in refills of Plavix  to [facility] in pharmacy as well.

## 2024-08-13 NOTE — Telephone Encounter (Signed)
 Spoke with pt who is eager to participate in Cardiac rehab.  Pt states she must be in CR for three weeks before she may return to work.  Financially she needs to return asap.  Began CR at [facility] but admits it is a bit of a drive.  Unfortunately we are unable to schedule CR unitl the new year.  Suggested pt continue at [facility] 2 x week for the month of December.  This will satisfy her PCP who will allow her to return to work on 09/10/24.  Pt can then transfer to traditional cardiac rehab  after completing brief orientation with credit for the visits she completes at [facility] .  Pt verbalizes understanding and is looking forward to the comprehensive educational classes. Saturnino Bernett PEAK, BSN Cardiac and Emergency Planning/management Officer

## 2024-08-13 NOTE — Telephone Encounter (Signed)
 Returned patient's call regarding switching back over the [facility] Cardiac rehab from [facility]- no answer, left message.  Patient will only be able to do TCR since that is what she started with at [facility], and we can schedule her for orientation which will be session #2 & #3. Currently no availability until January schedule opens.

## 2024-08-17 ENCOUNTER — Encounter

## 2024-08-17 DIAGNOSIS — M65331 Trigger finger, right middle finger: Secondary | ICD-10-CM | POA: Diagnosis not present

## 2024-08-17 DIAGNOSIS — G5601 Carpal tunnel syndrome, right upper limb: Secondary | ICD-10-CM | POA: Diagnosis not present

## 2024-08-17 DIAGNOSIS — G5602 Carpal tunnel syndrome, left upper limb: Secondary | ICD-10-CM | POA: Diagnosis not present

## 2024-08-19 ENCOUNTER — Encounter

## 2024-08-19 DIAGNOSIS — Z955 Presence of coronary angioplasty implant and graft: Secondary | ICD-10-CM

## 2024-08-19 DIAGNOSIS — Z48812 Encounter for surgical aftercare following surgery on the circulatory system: Secondary | ICD-10-CM | POA: Diagnosis not present

## 2024-08-19 NOTE — Progress Notes (Signed)
 Daily Session Note  Patient Details  Name: Doris Davis MRN: [id number] Date of Birth: 07-Mar-1971 Referring Provider:   Flowsheet Row Cardiac Rehab from 08/12/2024 in [facility] Cardiac and Pulmonary Rehab  Referring Provider Dr. Jill Clay    Encounter Date: 08/19/2024  Check In:  Session Check In - 08/19/24 0935       Check-In   Supervising physician immediately available to respond to emergencies See telemetry face sheet for immediately available ER MD    Location [facility]-Cardiac & Pulmonary Rehab    Staff Present Leita Franks RN,BSN;Joseph [facility] BS, Exercise Physiologist;Kristen Coble RN,BC,MSN    Virtual Visit No    Medication changes reported     No    Fall or balance concerns reported    No    Warm-up and Cool-down Performed on first and last piece of equipment    Resistance Training Performed Yes    VAD Patient? No    PAD/SET Patient? No      Pain Assessment   Currently in Pain? No/denies             Tobacco Use History[1]  Goals Met:  Independence with exercise equipment Exercise tolerated well No report of concerns or symptoms today Strength training completed today  Goals Unmet:  Not Applicable  Comments: First full day of exercise!  Patient was oriented to gym and equipment including functions, settings, policies, and procedures.  Patient's individual exercise prescription and treatment plan were reviewed.  All starting workloads were established based on the results of the 6 minute walk test done at initial orientation visit.  The plan for exercise progression was also introduced and progression will be customized based on patient's performance and goals.    Dr. Oneil Pinal is Medical Director for [facility] Cardiac Rehabilitation.  Dr. Fuad Aleskerov is Medical Director for [facility] Pulmonary Rehabilitation.    [1]  Social History Tobacco Use  Smoking Status Former   Current packs/day: 0.00   Average packs/day: 0.3  packs/day for 20.0 years (5.0 ttl pk-yrs)   Types: Cigarettes   Start date: 12/25/1993   Quit date: 12/25/2013   Years since quitting: 10.6  Smokeless Tobacco Never

## 2024-08-24 ENCOUNTER — Encounter

## 2024-08-24 DIAGNOSIS — Z955 Presence of coronary angioplasty implant and graft: Secondary | ICD-10-CM

## 2024-08-24 DIAGNOSIS — Z48812 Encounter for surgical aftercare following surgery on the circulatory system: Secondary | ICD-10-CM | POA: Diagnosis not present

## 2024-08-24 NOTE — Progress Notes (Signed)
 Daily Session Note  Patient Details  Name: Doris Davis MRN: [id number] Date of Birth: [birth year] Referring Provider:   Flowsheet Row Cardiac Rehab from 08/12/2024 in [facility] Cardiac and Pulmonary Rehab  Referring Provider Dr. Jill Clay    Encounter Date: 08/24/2024  Check In:  Session Check In - 08/24/24 0924       Check-In   Supervising physician immediately available to respond to emergencies See telemetry face sheet for immediately available ER MD    Location [facility]-Cardiac & Pulmonary Rehab    Staff Present Burnard Davenport RN,BSN,MPA;Maxon Conetta BS, Exercise Physiologist;Margaret Best, MS, Exercise Physiologist;Jason Elnor RDN,LDN    Virtual Visit No    Medication changes reported     No    Fall or balance concerns reported    No    Warm-up and Cool-down Performed on first and last piece of equipment    Resistance Training Performed Yes    VAD Patient? No    PAD/SET Patient? No      Pain Assessment   Currently in Pain? No/denies             Tobacco Use History[1]  Goals Met:  Independence with exercise equipment Exercise tolerated well No report of concerns or symptoms today Strength training completed today  Goals Unmet:  Not Applicable  Comments: Pt able to follow exercise prescription today without complaint.  Will continue to monitor for progression.    Dr. Oneil Pinal is Medical Director for [facility] Cardiac Rehabilitation.  Dr. Fuad Aleskerov is Medical Director for [facility] Pulmonary Rehabilitation.    [1]  Social History Tobacco Use  Smoking Status Former   Current packs/day: 0.00   Average packs/day: 0.3 packs/day for 20.0 years (5.0 ttl pk-yrs)   Types: Cigarettes   Start date: 12/25/1993   Quit date: 12/25/2013   Years since quitting: 10.6  Smokeless Tobacco Never

## 2024-08-25 ENCOUNTER — Encounter: Payer: Self-pay | Admitting: *Deleted

## 2024-08-25 DIAGNOSIS — R011 Cardiac murmur, unspecified: Secondary | ICD-10-CM | POA: Diagnosis not present

## 2024-08-25 DIAGNOSIS — G4733 Obstructive sleep apnea (adult) (pediatric): Secondary | ICD-10-CM | POA: Diagnosis not present

## 2024-08-25 DIAGNOSIS — I498 Other specified cardiac arrhythmias: Secondary | ICD-10-CM

## 2024-08-25 DIAGNOSIS — Z955 Presence of coronary angioplasty implant and graft: Secondary | ICD-10-CM

## 2024-08-25 NOTE — Progress Notes (Signed)
 Cardiac Individual Treatment Plan  Patient Details  Name: Doris Davis MRN: [id number] Date of Birth: 07/30/1971 Referring Provider:   Flowsheet Row Cardiac Rehab from 08/12/2024 in [facility] Cardiac and Pulmonary Rehab  Referring Provider Dr. Jill Clay    Initial Encounter Date:  Flowsheet Row Cardiac Rehab from 08/12/2024 in [facility] Cardiac and Pulmonary Rehab  Date 08/12/24    Visit Diagnosis: Status post coronary artery stent placement  Patient's Home Medications on Admission: Current Medications[1]  Past Medical History: Past Medical History:  Diagnosis Date   Allergy    Anemia    Anxiety    Arthritis    Asthma    with contact with cats   BV (bacterial vaginosis) 08/09/2013   Family history of adverse reaction to anesthesia    mother was sick for days   Hyperlipidemia    Hypertension    PONV (postoperative nausea and vomiting)    sts with ablation she was given meds for nausea and got sicker.   Vaginal bleeding 08/09/2013   Had supra cervical hyst 2012, has had bleeding after sex   Venereal disease 1990   chlamydia   Wears glasses     Tobacco Use: Tobacco Use History[2]  Labs: Review Flowsheet       Latest Ref Rng & Units 04/12/2013  Labs for ITP Cardiac and Pulmonary Rehab  TCO2 0 - 100 mmol/L 24      Exercise Target Goals: Exercise Program Goal: Individual exercise prescription set using results from initial 6 min walk test and THRR while considering  patients activity barriers and safety.   Exercise Prescription Goal: Initial exercise prescription builds to 30-45 minutes a day of aerobic activity, 2-3 days per week.  Home exercise guidelines will be given to patient during program as part of exercise prescription that the participant will acknowledge.   Education: Aerobic Exercise: - Group verbal and visual presentation on the components of exercise prescription. Introduces F.I.T.T principle from ACSM for exercise prescriptions.  Reviews F.I.T.T.  principles of aerobic exercise including progression. Written material provided at class time.   Education: Resistance Exercise: - Group verbal and visual presentation on the components of exercise prescription. Introduces F.I.T.T principle from ACSM for exercise prescriptions  Reviews F.I.T.T. principles of resistance exercise including progression. Written material provided at class time.    Education: Exercise & Equipment Safety: - Individual verbal instruction and demonstration of equipment use and safety with use of the equipment. Flowsheet Row Cardiac Rehab from 08/12/2024 in [facility] Cardiac and Pulmonary Rehab  Date 08/12/24  Educator [facility]  Instruction Review Code 1- Verbalizes Understanding    Education: Exercise Physiology & General Exercise Guidelines: - Group verbal and written instruction with models to review the exercise physiology of the cardiovascular system and associated critical values. Provides general exercise guidelines with specific guidelines to those with heart or lung disease. Written material provided at class time.   Education: Flexibility, Balance, Mind/Body Relaxation: - Group verbal and visual presentation with interactive activity on the components of exercise prescription. Introduces F.I.T.T principle from ACSM for exercise prescriptions. Reviews F.I.T.T. principles of flexibility and balance exercise training including progression. Also discusses the mind body connection.  Reviews various relaxation techniques to help reduce and manage stress (i.e. Deep breathing, progressive muscle relaxation, and visualization). Balance handout provided to take home. Written material provided at class time.   Activity Barriers & Risk Stratification:  Activity Barriers & Cardiac Risk Stratification - 08/12/24 1444       Activity Barriers & Cardiac  Risk Stratification   Activity Barriers Other (comment);Back Problems    Comments right trigger finger, spinal stenosis     Cardiac Risk Stratification Moderate          6 Minute Walk:  6 Minute Walk     Row Name 08/12/24 1646         6 Minute Walk   Phase Initial     Distance 1560 feet     Walk Time 6 minutes     # of Rest Breaks 0     MPH 2.95     METS 4.1     RPE 9     Perceived Dyspnea  0     VO2 Peak 14.37     Symptoms No     Resting HR 75 bpm     Resting BP 122/74     Resting Oxygen Saturation  96 %     Exercise Oxygen Saturation  during 6 min walk 99 %     Max Ex. HR 118 bpm     Max Ex. BP 134/76     2 Minute Post BP 126/76        Oxygen Initial Assessment:   Oxygen Re-Evaluation:   Oxygen Discharge (Final Oxygen Re-Evaluation):   Initial Exercise Prescription:  Initial Exercise Prescription - 08/12/24 1600       Date of Initial Exercise RX and Referring Provider   Date 08/12/24    Referring Provider Dr. Jill Clay      Oxygen   Maintain Oxygen Saturation 88% or higher      Treadmill   MPH 3    Grade 0    Minutes 15    METs 3.3      REL-XR   Level 3    Watts 25    Speed 50    Minutes 15    METs 4.1      Biostep-RELP   Level 3    SPM 50    Minutes 15    METs 4.1      Track   Laps 42    Minutes 15    METs 3.28      Prescription Details   Duration Progress to 30 minutes of continuous aerobic without signs/symptoms of physical distress      Intensity   THRR 40-80% of Max Heartrate 111-147    Ratings of Perceived Exertion 11-13    Perceived Dyspnea 0-4      Progression   Progression Continue to progress workloads to maintain intensity without signs/symptoms of physical distress.      Resistance Training   Training Prescription Yes    Weight 4lb    Reps 10-15          Perform Capillary Blood Glucose checks as needed.  Exercise Prescription Changes:   Exercise Prescription Changes     Row Name 08/12/24 1600 08/25/24 0700           Response to Exercise   Blood Pressure (Admit) 122/74 104/64      Blood Pressure (Exercise)  134/76 118/62      Blood Pressure (Exit) 126/76 98/68      Heart Rate (Admit) 75 bpm 65 bpm      Heart Rate (Exercise) 118 bpm 113 bpm      Heart Rate (Exit) 92 bpm 93 bpm      Oxygen Saturation (Admit) 96 % --      Oxygen Saturation (Exercise) 99 % --      Oxygen Saturation (  Exit) 99 % --      Rating of Perceived Exertion (Exercise) 9 12      Perceived Dyspnea (Exercise) 0 0      Symptoms none none      Comments results first 2 weeks of exercise      Duration -- Progress to 30 minutes of  aerobic without signs/symptoms of physical distress      Intensity -- THRR unchanged        Progression   Progression -- Continue to progress workloads to maintain intensity without signs/symptoms of physical distress.      Average METs -- 2.7        Resistance Training   Weight -- 4lb      Reps -- 10-15        Interval Training   Interval Training -- No        Treadmill   MPH -- 3      Grade -- 0      Minutes -- 15      METs -- 3.3        T5 Nustep   Level -- 3      Minutes -- 15      METs -- 2.1        Oxygen   Maintain Oxygen Saturation -- 88% or higher         Exercise Comments:   Exercise Comments     Row Name 08/19/24 [phone]           Exercise Comments First full day of exercise!  Patient was oriented to gym and equipment including functions, settings, policies, and procedures.  Patient's individual exercise prescription and treatment plan were reviewed.  All starting workloads were established based on the results of the 6 minute walk test done at initial orientation visit.  The plan for exercise progression was also introduced and progression will be customized based on patient's performance and goals.          Exercise Goals and Review:   Exercise Goals     Row Name 08/12/24 1650             Exercise Goals   Increase Physical Activity Yes       Intervention Provide advice, education, support and counseling about physical activity/exercise needs.;Develop  an individualized exercise prescription for aerobic and resistive training based on initial evaluation findings, risk stratification, comorbidities and participant's personal goals.       Expected Outcomes Short Term: Attend rehab on a regular basis to increase amount of physical activity.;Long Term: Add in home exercise to make exercise part of routine and to increase amount of physical activity.;Long Term: Exercising regularly at least 3-5 days a week.       Increase Strength and Stamina Yes       Intervention Provide advice, education, support and counseling about physical activity/exercise needs.;Develop an individualized exercise prescription for aerobic and resistive training based on initial evaluation findings, risk stratification, comorbidities and participant's personal goals.       Expected Outcomes Short Term: Increase workloads from initial exercise prescription for resistance, speed, and METs.;Short Term: Perform resistance training exercises routinely during rehab and add in resistance training at home;Long Term: Improve cardiorespiratory fitness, muscular endurance and strength as measured by increased METs and functional capacity ( )       Able to understand and use rate of perceived exertion (RPE) scale Yes       Intervention Provide education and explanation on how to  use RPE scale       Expected Outcomes Short Term: Able to use RPE daily in rehab to express subjective intensity level;Long Term:  Able to use RPE to guide intensity level when exercising independently       Able to understand and use Dyspnea scale Yes       Intervention Provide education and explanation on how to use Dyspnea scale       Expected Outcomes Short Term: Able to use Dyspnea scale daily in rehab to express subjective sense of shortness of breath during exertion;Long Term: Able to use Dyspnea scale to guide intensity level when exercising independently       Knowledge and understanding of Target Heart Rate  Range (THRR) Yes       Intervention Provide education and explanation of THRR including how the numbers were predicted and where they are located for reference       Expected Outcomes Short Term: Able to state/look up THRR;Long Term: Able to use THRR to govern intensity when exercising independently;Short Term: Able to use daily as guideline for intensity in rehab       Able to check pulse independently Yes       Intervention Provide education and demonstration on how to check pulse in carotid and radial arteries.;Review the importance of being able to check your own pulse for safety during independent exercise       Expected Outcomes Short Term: Able to explain why pulse checking is important during independent exercise;Long Term: Able to check pulse independently and accurately       Understanding of Exercise Prescription Yes       Intervention Provide education, explanation, and written materials on patient's individual exercise prescription       Expected Outcomes Short Term: Able to explain program exercise prescription;Long Term: Able to explain home exercise prescription to exercise independently          Exercise Goals Re-Evaluation :  Exercise Goals Re-Evaluation     Row Name 08/19/24 9061 08/25/24 0749           Exercise Goal Re-Evaluation   Exercise Goals Review Increase Physical Activity;Able to understand and use rate of perceived exertion (RPE) scale;Knowledge and understanding of Target Heart Rate Range (THRR);Understanding of Exercise Prescription;Increase Strength and Stamina;Able to understand and use Dyspnea scale;Able to check pulse independently Increase Physical Activity;Increase Strength and Stamina;Understanding of Exercise Prescription      Comments Reviewed RPE and dyspnea scale, THR and program prescription with pt today.  Pt voiced understanding and was given a copy of goals to take home. Meshell is off to a good start in the program, and was able to attend her first  session during this review period. During her session she was able to use the treadmill at and no incline, and the T6 nustep at level 3. We will continue to monitor her progress in the program.      Expected Outcomes Short: Use RPE daily to regulate intensity.  Long: Follow program prescription in THR. Short: Continue to follow exercise prescription. Long: Continue exercise to improve strength and stamina.         Discharge Exercise Prescription (Final Exercise Prescription Changes):  Exercise Prescription Changes - 08/25/24 0700       Response to Exercise   Blood Pressure (Admit) 104/64    Blood Pressure (Exercise) 118/62    Blood Pressure (Exit) 98/68    Heart Rate (Admit) 65 bpm    Heart Rate (Exercise)  113 bpm    Heart Rate (Exit) 93 bpm    Rating of Perceived Exertion (Exercise) 12    Perceived Dyspnea (Exercise) 0    Symptoms none    Comments first 2 weeks of exercise    Duration Progress to 30 minutes of  aerobic without signs/symptoms of physical distress    Intensity THRR unchanged      Progression   Progression Continue to progress workloads to maintain intensity without signs/symptoms of physical distress.    Average METs 2.7      Resistance Training   Weight 4lb    Reps 10-15      Interval Training   Interval Training No      Treadmill   MPH 3    Grade 0    Minutes 15    METs 3.3      T5 Nustep   Level 3    Minutes 15    METs 2.1      Oxygen   Maintain Oxygen Saturation 88% or higher          Nutrition:  Target Goals: Understanding of nutrition guidelines, daily intake of sodium 1500mg , cholesterol 200mg , calories 30% from fat and 7% or less from saturated fats, daily to have 5 or more servings of fruits and vegetables.  Education: Nutrition 1 -Group instruction provided by verbal, written material, interactive activities, discussions, models, and posters to present general guidelines for heart healthy nutrition including macronutrients,  label reading, and promoting whole foods over processed counterparts. Education serves as pensions consultant of discussion of heart healthy eating for all. Written material provided at class time.    Education: Nutrition 2 -Group instruction provided by verbal, written material, interactive activities, discussions, models, and posters to present general guidelines for heart healthy nutrition including sodium, cholesterol, and saturated fat. Providing guidance of habit forming to improve blood pressure, cholesterol, and body weight. Written material provided at class time.     Biometrics:  Pre Biometrics - 08/12/24 1651       Pre Biometrics   Height 5' 4.5 (1.638 m)    Weight 186 lb 9.6 oz (84.6 kg)    Waist Circumference 38.5 inches    Hip Circumference 42.5 inches    Waist to Hip Ratio 0.91 %    BMI (Calculated) 31.55    Single Leg Stand 15 seconds           Nutrition Therapy Plan and Nutrition Goals:   Nutrition Assessments:  MEDIFICTS Score Key: >=70 Need to make dietary changes  40-70 Heart Healthy Diet <= 40 Therapeutic Level Cholesterol Diet  Flowsheet Row Cardiac Rehab from 08/12/2024 in [facility] Cardiac and Pulmonary Rehab  Picture Your Plate Total Score on Admission 76   Picture Your Plate Scores: <59 Unhealthy dietary pattern with much room for improvement. 41-50 Dietary pattern unlikely to meet recommendations for good health and room for improvement. 51-60 More healthful dietary pattern, with some room for improvement.  >60 Healthy dietary pattern, although there may be some specific behaviors that could be improved.    Nutrition Goals Re-Evaluation:   Nutrition Goals Discharge (Final Nutrition Goals Re-Evaluation):   Psychosocial: Target Goals: Acknowledge presence or absence of significant depression and/or stress, maximize coping skills, provide positive support system. Participant is able to verbalize types and ability to use techniques and skills needed for  reducing stress and depression.   Education: Stress, Anxiety, and Depression - Group verbal and visual presentation to define topics covered.  Reviews how body is impacted  by stress, anxiety, and depression.  Also discusses healthy ways to reduce stress and to treat/manage anxiety and depression. Written material provided at class time.   Education: Sleep Hygiene -Provides group verbal and written instruction about how sleep can affect your health.  Define sleep hygiene, discuss sleep cycles and impact of sleep habits. Review good sleep hygiene tips.   Initial Review & Psychosocial Screening:  Initial Psych Review & Screening - 08/12/24 1452       Initial Review   Current issues with Current Depression;Current Psychotropic Meds;Current Stress Concerns;Current Sleep Concerns    Source of Stress Concerns Unable to perform yard/household activities;Occupation      Family Dynamics   Good Support System? Yes   husband and children     Barriers   Psychosocial barriers to participate in program The patient should benefit from training in stress management and relaxation.;There are no identifiable barriers or psychosocial needs.      Screening Interventions   Interventions Encouraged to exercise;To provide support and resources with identified psychosocial needs;Provide feedback about the scores to participant    Expected Outcomes Short Term goal: Utilizing psychosocial counselor, staff and physician to assist with identification of specific Stressors or current issues interfering with healing process. Setting desired goal for each stressor or current issue identified.;Long Term Goal: Stressors or current issues are controlled or eliminated.;Short Term goal: Identification and review with participant of any Quality of Life or Depression concerns found by scoring the questionnaire.;Long Term goal: The participant improves quality of Life and PHQ9 Scores as seen by post scores and/or verbalization of  changes          Quality of Life Scores:   Quality of Life - 08/12/24 1635       Quality of Life   Select Quality of Life      Quality of Life Scores   Health/Function Pre 13.83 %    Socioeconomic Pre 24.31 %    Psych/Spiritual Pre 16.5 %    Family Pre 26.4 %    GLOBAL Pre 18.56 %         Scores of 19 and below usually indicate a poorer quality of life in these areas.  A difference of  2-3 points is a clinically meaningful difference.  A difference of 2-3 points in the total score of the Quality of Life Index has been associated with significant improvement in overall quality of life, self-image, physical symptoms, and general health in studies assessing change in quality of life.  PHQ-9: Review Flowsheet       08/12/2024 07/30/2024  Depression screen PHQ 2/9  Decreased Interest 1 0  Down, Depressed, Hopeless 2 0  PHQ - 2 Score 3 0  Altered sleeping 3 -  Tired, decreased energy 3 -  Change in appetite 0 -  Feeling bad or failure about yourself  1 -  Trouble concentrating 2 -  Moving slowly or fidgety/restless 0 -  Suicidal thoughts 0 -  PHQ-9 Score 12 -  Difficult doing work/chores Somewhat difficult -   Interpretation of Total Score  Total Score Depression Severity:  1-4 = Minimal depression, 5-9 = Mild depression, 10-14 = Moderate depression, 15-19 = Moderately severe depression, 20-27 = Severe depression   Psychosocial Evaluation and Intervention:  Psychosocial Evaluation - 08/12/24 1557       Psychosocial Evaluation & Interventions   Interventions Stress management education;Relaxation education;Encouraged to exercise with the program and follow exercise prescription    Comments Snow is excited to start  the program as she had an unexpect cath with stent placement 3 weeks ago. She works full time as an airline pilot and per her doctor, must succesfully complete 3 weeks of rehab prior to starting back at work. Kimmarie lives with her husband and one child and states  that she also has 2 children that live outside of the home and that her entire family has been very supportive during this event. Nyari does have a history of depression and is currently on medication for same. She states that she enjoys reading, hiking and gardening to decompress. She does admit that her depression is worse this time of year and given the fact that she has decreased energy level following her cardiac event.    Expected Outcomes Short: Attend cardiac rehab for education and exercise. Long: Develop and maintain positive self care habits.    Continue Psychosocial Services  Follow up required by staff          Psychosocial Re-Evaluation:   Psychosocial Discharge (Final Psychosocial Re-Evaluation):   Vocational Rehabilitation: Provide vocational rehab assistance to qualifying candidates.   Vocational Rehab Evaluation & Intervention:  Vocational Rehab - 08/12/24 1606       Initial Vocational Rehab Evaluation & Intervention   Assessment shows need for Vocational Rehabilitation No          Education: Education Goals: Education classes will be provided on a variety of topics geared toward better understanding of heart health and risk factor modification. Participant will state understanding/return demonstration of topics presented as noted by education test scores.  Learning Barriers/Preferences:  Learning Barriers/Preferences - 08/12/24 1605       Learning Barriers/Preferences   Learning Barriers None    Learning Preferences Group Instruction;Individual Instruction;Pictoral;Skilled Demonstration;Verbal Instruction          General Cardiac Education Topics:  AED/CPR: - Group verbal and written instruction with the use of models to demonstrate the basic use of the AED with the basic ABC's of resuscitation.   Test and Procedures: - Group verbal and visual presentation and models provide information about basic cardiac anatomy and function. Reviews the testing  methods done to diagnose heart disease and the outcomes of the test results. Describes the treatment choices: Medical Management, Angioplasty, or Coronary Bypass Surgery for treating various heart conditions including Myocardial Infarction, Angina, Valve Disease, and Cardiac Arrhythmias. Written material provided at class time.   Medication Safety: - Group verbal and visual instruction to review commonly prescribed medications for heart and lung disease. Reviews the medication, class of the drug, and side effects. Includes the steps to properly store meds and maintain the prescription regimen. Written material provided at class time.   Intimacy: - Group verbal instruction through game format to discuss how heart and lung disease can affect sexual intimacy. Written material provided at class time.   Know Your Numbers and Heart Failure: - Group verbal and visual instruction to discuss disease risk factors for cardiac and pulmonary disease and treatment options.  Reviews associated critical values for Overweight/Obesity, Hypertension, Cholesterol, and Diabetes.  Discusses basics of heart failure: signs/symptoms and treatments.  Introduces Heart Failure Zone chart for action plan for heart failure. Written material provided at class time.   Infection Prevention: - Provides verbal and written material to individual with discussion of infection control including proper hand washing and proper equipment cleaning during exercise session. Flowsheet Row Cardiac Rehab from 08/12/2024 in [facility] Cardiac and Pulmonary Rehab  Date 08/12/24  Educator [facility]  Instruction Review Code 1- [employer]  Understanding    Falls Prevention: - Provides verbal and written material to individual with discussion of falls prevention and safety. Flowsheet Row Cardiac Rehab from 08/12/2024 in [facility] Cardiac and Pulmonary Rehab  Date 08/12/24  Educator [facility]  Instruction Review Code 1- Verbalizes Understanding    Other: -Provides  group and verbal instruction on various topics (see comments)   Knowledge Questionnaire Score:   Core Components/Risk Factors/Patient Goals at Admission:  Personal Goals and Risk Factors at Admission - 08/12/24 1604       Core Components/Risk Factors/Patient Goals on Admission    Weight Management Yes;Weight Loss    Intervention Weight Management: Develop a combined nutrition and exercise program designed to reach desired caloric intake, while maintaining appropriate intake of nutrient and fiber, sodium and fats, and appropriate energy expenditure required for the weight goal.;Weight Management: Provide education and appropriate resources to help participant work on and attain dietary goals.;Weight Management/Obesity: Establish reasonable short term and long term weight goals.;Obesity: Provide education and appropriate resources to help participant work on and attain dietary goals.    Goal Weight: Long Term 150 lb (68 kg)    Expected Outcomes Short Term: Continue to assess and modify interventions until short term weight is achieved;Long Term: Adherence to nutrition and physical activity/exercise program aimed toward attainment of established weight goal;Weight Maintenance: Understanding of the daily nutrition guidelines, which includes 25-35% calories from fat, 7% or less cal from saturated fats, less than 200mg  cholesterol, less than 1.5gm of sodium, & 5 or more servings of fruits and vegetables daily;Weight Loss: Understanding of general recommendations for a balanced deficit meal plan, which promotes 1-2 lb weight loss per week and includes a negative energy balance of [phone] kcal/d;Understanding recommendations for meals to include 15-35% energy as protein, 25-35% energy from fat, 35-60% energy from carbohydrates, less than 200mg  of dietary cholesterol, 20-35 gm of total fiber daily;Understanding of distribution of calorie intake throughout the day with the consumption of 4-5 meals/snacks     Hypertension Yes    Intervention Provide education on lifestyle modifcations including regular physical activity/exercise, weight management, moderate sodium restriction and increased consumption of fresh fruit, vegetables, and low fat dairy, alcohol moderation, and smoking cessation.;Monitor prescription use compliance.    Expected Outcomes Short Term: Continued assessment and intervention until BP is < 140/41mm HG in hypertensive participants. < 130/73mm HG in hypertensive participants with diabetes, heart failure or chronic kidney disease.;Long Term: Maintenance of blood pressure at goal levels.    Lipids Yes    Intervention Provide education and support for participant on nutrition & aerobic/resistive exercise along with prescribed medications to achieve LDL 70mg , HDL >40mg .    Expected Outcomes Short Term: Participant states understanding of desired cholesterol values and is compliant with medications prescribed. Participant is following exercise prescription and nutrition guidelines.;Long Term: Cholesterol controlled with medications as prescribed, with individualized exercise RX and with personalized nutrition plan. Value goals: LDL < 70mg , HDL > 40 mg.          Education:Diabetes - Individual verbal and written instruction to review signs/symptoms of diabetes, desired ranges of glucose level fasting, after meals and with exercise. Acknowledge that pre and post exercise glucose checks will be done for 3 sessions at entry of program.   Core Components/Risk Factors/Patient Goals Review:    Core Components/Risk Factors/Patient Goals at Discharge (Final Review):    ITP Comments:  ITP Comments     Row Name 08/12/24 1550 08/19/24 0936 08/25/24 1337       ITP  Comments Completed program orientation and . Initial ITP created and sent for review to Medical Director. First full day of exercise!  Patient was oriented to gym and equipment including functions, settings, policies, and  procedures.  Patient's individual exercise prescription and treatment plan were reviewed.  All starting workloads were established based on the results of the 6 minute walk test done at initial orientation visit.  The plan for exercise progression was also introduced and progression will be customized based on patient's performance and goals. 30 Day review completed. Medical Director ITP review done, changes made as directed, and signed approval by Medical Director. New to program        Comments: 30 day review     [1]  Current Outpatient Medications:    acetaminophen  (TYLENOL ) 500 MG tablet, Take 500 mg by mouth daily as needed for moderate pain (pain score 4-6), mild pain (pain score 1-3) or headache., Disp: , Rfl:    ALPRAZolam  (XANAX ) 0.25 MG tablet, Take 0.25 mg by mouth daily as needed for anxiety., Disp: , Rfl:    aspirin  EC 81 MG tablet, Take 81 mg by mouth daily. Swallow whole., Disp: , Rfl:    buPROPion  (WELLBUTRIN  XL) 300 MG 24 hr tablet, Take 300 mg by mouth daily., Disp: , Rfl: 0   clopidogrel  (PLAVIX ) 75 MG tablet, Take 1 tablet (75 mg total) by mouth daily., Disp: 90 tablet, Rfl: 3   levothyroxine  (SYNTHROID ) 112 MCG tablet, Take 112 mcg by mouth daily before breakfast., Disp: , Rfl:    liothyronine  (CYTOMEL ) 5 MCG tablet, Take 5 mcg by mouth daily., Disp: , Rfl:    losartan -hydrochlorothiazide  (HYZAAR) 100-25 MG tablet, Take 1 tablet by mouth daily., Disp: , Rfl:    metoprolol  succinate (TOPROL  XL) 25 MG 24 hr tablet, Take 1 tablet (25 mg total) by mouth daily., Disp: 90 tablet, Rfl: 3   montelukast (SINGULAIR) 10 MG tablet, Take 10 mg by mouth daily as needed (allergies)., Disp: , Rfl:    Multiple Vitamin (MULTIVITAMIN) capsule, Take 1 capsule by mouth daily. One a day 50+, Disp: , Rfl:    nitroGLYCERIN  (NITROSTAT ) 0.4 MG SL tablet, Place 1 tablet (0.4 mg total) under the tongue every 5 (five) minutes as needed for chest pain. (Patient not taking: Reported on 07/30/2024), Disp:  25 tablet, Rfl: 2   rosuvastatin (CRESTOR) 20 MG tablet, Take 20 mg by mouth daily., Disp: , Rfl:  [2]  Social History Tobacco Use  Smoking Status Former   Current packs/day: 0.00   Average packs/day: 0.3 packs/day for 20.0 years (5.0 ttl pk-yrs)   Types: Cigarettes   Start date: 12/25/1993   Quit date: 12/25/2013   Years since quitting: 10.6  Smokeless Tobacco Never

## 2024-08-26 ENCOUNTER — Encounter

## 2024-08-26 DIAGNOSIS — Z955 Presence of coronary angioplasty implant and graft: Secondary | ICD-10-CM | POA: Diagnosis not present

## 2024-08-26 DIAGNOSIS — Z48812 Encounter for surgical aftercare following surgery on the circulatory system: Secondary | ICD-10-CM | POA: Diagnosis not present

## 2024-08-26 NOTE — Progress Notes (Signed)
 Daily Session Note  Patient Details  Name: Doris Davis MRN: [id number] Date of Birth: May 07, 1971 Referring Provider:   Flowsheet Row Cardiac Rehab from 08/12/2024 in [facility]  Referring Provider Dr. Jill Clay    Encounter Date: 08/26/2024  Check In:  Session Check In - 08/26/24 0935       Check-In   Supervising physician immediately available to respond to emergencies See telemetry face sheet for immediately available ER MD    Location [facility]-Cardiac & Pulmonary Rehab    Staff Present Leita Franks RN,BSN;Joseph [facility] BS, Exercise Physiologist;Margaret Best, MS, Exercise Physiologist    Virtual Visit No    Medication changes reported     No    Fall or balance concerns reported    No    Warm-up and Cool-down Performed on first and last piece of equipment    Resistance Training Performed Yes    VAD Patient? No    PAD/SET Patient? No      Pain Assessment   Currently in Pain? No/denies             Tobacco Use History[1]  Goals Met:  Independence with exercise equipment Exercise tolerated well No report of concerns or symptoms today Strength training completed today  Goals Unmet:  Not Applicable  Comments: Pt able to follow exercise prescription today without complaint.  Will continue to monitor for progression.    Dr. Oneil Pinal is Medical Director for [facility] Cardiac Rehabilitation.  Dr. Fuad Aleskerov is Medical Director for [facility] Pulmonary Rehabilitation.    [1]  Social History Tobacco Use  Smoking Status Former   Current packs/day: 0.00   Average packs/day: 0.3 packs/day for 20.0 years (5.0 ttl pk-yrs)   Types: Cigarettes   Start date: 12/25/1993   Quit date: 12/25/2013   Years since quitting: 10.6  Smokeless Tobacco Never

## 2024-08-30 NOTE — Progress Notes (Signed)
 Normal sinus rhythm, rare premature beats. No sustained arrhythmia. 3 episodes of patient triggered events, all of which were associated with sinus tachycardia which is normal rhythm just slightly faster heart rate. This is benign.

## 2024-08-31 ENCOUNTER — Encounter

## 2024-08-31 DIAGNOSIS — Z48812 Encounter for surgical aftercare following surgery on the circulatory system: Secondary | ICD-10-CM | POA: Diagnosis not present

## 2024-08-31 DIAGNOSIS — Z955 Presence of coronary angioplasty implant and graft: Secondary | ICD-10-CM

## 2024-08-31 NOTE — Progress Notes (Signed)
 Daily Session Note  Patient Details  Name: Doris Davis MRN: [id number] Date of Birth: 1971/02/21 Referring Provider:   Flowsheet Row Cardiac Rehab from 08/12/2024 in [facility] Cardiac and Pulmonary Rehab  Referring Provider Dr. Jill Clay    Encounter Date: 08/31/2024  Check In:  Session Check In - 08/31/24 0919       Check-In   Supervising physician immediately available to respond to emergencies See telemetry face sheet for immediately available ER MD    Location [facility]-Cardiac & Pulmonary Rehab    Staff Present Burnard Davenport RN,BSN,MPA;Laura Cates RN,BSN;Kristen Coble RN,BC,MSN;Jason [facility]    Virtual Visit No    Medication changes reported     No    Fall or balance concerns reported    No    Warm-up and Cool-down Performed on first and last piece of equipment    Resistance Training Performed Yes    VAD Patient? No    PAD/SET Patient? No      Pain Assessment   Currently in Pain? No/denies             Tobacco Use History[1]  Goals Met:  Independence with exercise equipment Exercise tolerated well No report of concerns or symptoms today Strength training completed today  Goals Unmet:  Not Applicable  Comments: Pt able to follow exercise prescription today without complaint.  Will continue to monitor for progression.    Dr. Oneil Pinal is Medical Director for [facility] Cardiac Rehabilitation.  Dr. Fuad Aleskerov is Medical Director for [facility] Pulmonary Rehabilitation.    [1]  Social History Tobacco Use  Smoking Status Former   Current packs/day: 0.00   Average packs/day: 0.3 packs/day for 20.0 years (5.0 ttl pk-yrs)   Types: Cigarettes   Start date: 12/25/1993   Quit date: 12/25/2013   Years since quitting: 10.6  Smokeless Tobacco Never

## 2024-09-06 DIAGNOSIS — I2510 Atherosclerotic heart disease of native coronary artery without angina pectoris: Secondary | ICD-10-CM | POA: Diagnosis not present

## 2024-09-06 DIAGNOSIS — D649 Anemia, unspecified: Secondary | ICD-10-CM | POA: Diagnosis not present

## 2024-09-06 DIAGNOSIS — T782XXA Anaphylactic shock, unspecified, initial encounter: Secondary | ICD-10-CM | POA: Diagnosis not present

## 2024-09-06 DIAGNOSIS — G5600 Carpal tunnel syndrome, unspecified upper limb: Secondary | ICD-10-CM | POA: Diagnosis not present

## 2024-09-07 ENCOUNTER — Telehealth (HOSPITAL_COMMUNITY): Payer: Self-pay

## 2024-09-07 ENCOUNTER — Encounter: Payer: Self-pay | Admitting: *Deleted

## 2024-09-07 ENCOUNTER — Encounter

## 2024-09-07 DIAGNOSIS — Z955 Presence of coronary angioplasty implant and graft: Secondary | ICD-10-CM

## 2024-09-07 DIAGNOSIS — M545 Low back pain, unspecified: Secondary | ICD-10-CM | POA: Diagnosis not present

## 2024-09-07 NOTE — Progress Notes (Signed)
 Cardiac Individual Treatment Plan  Patient Details  Name: Doris Davis MRN: [id number] Date of Birth: 1970-12-21 Referring Provider:   Flowsheet Row Cardiac Rehab from 08/12/2024 in [facility] Cardiac and Pulmonary Rehab  Referring Provider Dr. Jill Clay    Initial Encounter Date:  Flowsheet Row Cardiac Rehab from 08/12/2024 in [facility] Cardiac and Pulmonary Rehab  Date 08/12/24    Visit Diagnosis: Status post coronary artery stent placement  Patient's Home Medications on Admission: Current Medications[1]  Past Medical History: Past Medical History:  Diagnosis Date   Allergy    Anemia    Anxiety    Arthritis    Asthma    with contact with cats   BV (bacterial vaginosis) 08/09/2013   Family history of adverse reaction to anesthesia    mother was sick for days   Hyperlipidemia    Hypertension    PONV (postoperative nausea and vomiting)    sts with ablation she was given meds for nausea and got sicker.   Vaginal bleeding 08/09/2013   Had supra cervical hyst 2012, has had bleeding after sex   Venereal disease 1990   chlamydia   Wears glasses     Tobacco Use: Tobacco Use History[2]  Labs: Review Flowsheet       Latest Ref Rng & Units 04/12/2013  Labs for ITP Cardiac and Pulmonary Rehab  TCO2 0 - 100 mmol/L 24      Exercise Target Goals: Exercise Program Goal: Individual exercise prescription set using results from initial 6 min walk test and THRR while considering  patients activity barriers and safety.   Exercise Prescription Goal: Initial exercise prescription builds to 30-45 minutes a day of aerobic activity, 2-3 days per week.  Home exercise guidelines will be given to patient during program as part of exercise prescription that the participant will acknowledge.   Education: Aerobic Exercise: - Group verbal and visual presentation on the components of exercise prescription. Introduces F.I.T.T principle from ACSM for exercise prescriptions.  Reviews F.I.T.T.  principles of aerobic exercise including progression. Written material provided at class time.   Education: Resistance Exercise: - Group verbal and visual presentation on the components of exercise prescription. Introduces F.I.T.T principle from ACSM for exercise prescriptions  Reviews F.I.T.T. principles of resistance exercise including progression. Written material provided at class time.    Education: Exercise & Equipment Safety: - Individual verbal instruction and demonstration of equipment use and safety with use of the equipment. Flowsheet Row Cardiac Rehab from 08/12/2024 in [facility] Cardiac and Pulmonary Rehab  Date 08/12/24  Educator [facility]  Instruction Review Code 1- Verbalizes Understanding    Education: Exercise Physiology & General Exercise Guidelines: - Group verbal and written instruction with models to review the exercise physiology of the cardiovascular system and associated critical values. Provides general exercise guidelines with specific guidelines to those with heart or lung disease. Written material provided at class time.   Education: Flexibility, Balance, Mind/Body Relaxation: - Group verbal and visual presentation with interactive activity on the components of exercise prescription. Introduces F.I.T.T principle from ACSM for exercise prescriptions. Reviews F.I.T.T. principles of flexibility and balance exercise training including progression. Also discusses the mind body connection.  Reviews various relaxation techniques to help reduce and manage stress (i.e. Deep breathing, progressive muscle relaxation, and visualization). Balance handout provided to take home. Written material provided at class time.   Activity Barriers & Risk Stratification:  Activity Barriers & Cardiac Risk Stratification - 08/12/24 1444       Activity Barriers & Cardiac  Risk Stratification   Activity Barriers Other (comment);Back Problems    Comments right trigger finger, spinal stenosis     Cardiac Risk Stratification Moderate          6 Minute Walk:  6 Minute Walk     Row Name 08/12/24 1646         6 Minute Walk   Phase Initial     Distance 1560 feet     Walk Time 6 minutes     # of Rest Breaks 0     MPH 2.95     METS 4.1     RPE 9     Perceived Dyspnea  0     VO2 Peak 14.37     Symptoms No     Resting HR 75 bpm     Resting BP 122/74     Resting Oxygen Saturation  96 %     Exercise Oxygen Saturation  during 6 min walk 99 %     Max Ex. HR 118 bpm     Max Ex. BP 134/76     2 Minute Post BP 126/76        Oxygen Initial Assessment:   Oxygen Re-Evaluation:   Oxygen Discharge (Final Oxygen Re-Evaluation):   Initial Exercise Prescription:  Initial Exercise Prescription - 08/12/24 1600       Date of Initial Exercise RX and Referring Provider   Date 08/12/24    Referring Provider Dr. Jill Clay      Oxygen   Maintain Oxygen Saturation 88% or higher      Treadmill   MPH 3    Grade 0    Minutes 15    METs 3.3      REL-XR   Level 3    Watts 25    Speed 50    Minutes 15    METs 4.1      Biostep-RELP   Level 3    SPM 50    Minutes 15    METs 4.1      Track   Laps 42    Minutes 15    METs 3.28      Prescription Details   Duration Progress to 30 minutes of continuous aerobic without signs/symptoms of physical distress      Intensity   THRR 40-80% of Max Heartrate 111-147    Ratings of Perceived Exertion 11-13    Perceived Dyspnea 0-4      Progression   Progression Continue to progress workloads to maintain intensity without signs/symptoms of physical distress.      Resistance Training   Training Prescription Yes    Weight 4lb    Reps 10-15          Perform Capillary Blood Glucose checks as needed.  Exercise Prescription Changes:   Exercise Prescription Changes     Row Name 08/12/24 1600 08/25/24 0700           Response to Exercise   Blood Pressure (Admit) 122/74 104/64      Blood Pressure (Exercise)  134/76 118/62      Blood Pressure (Exit) 126/76 98/68      Heart Rate (Admit) 75 bpm 65 bpm      Heart Rate (Exercise) 118 bpm 113 bpm      Heart Rate (Exit) 92 bpm 93 bpm      Oxygen Saturation (Admit) 96 % --      Oxygen Saturation (Exercise) 99 % --      Oxygen Saturation (  Exit) 99 % --      Rating of Perceived Exertion (Exercise) 9 12      Perceived Dyspnea (Exercise) 0 0      Symptoms none none      Comments results first 2 weeks of exercise      Duration -- Progress to 30 minutes of  aerobic without signs/symptoms of physical distress      Intensity -- THRR unchanged        Progression   Progression -- Continue to progress workloads to maintain intensity without signs/symptoms of physical distress.      Average METs -- 2.7        Resistance Training   Weight -- 4lb      Reps -- 10-15        Interval Training   Interval Training -- No        Treadmill   MPH -- 3      Grade -- 0      Minutes -- 15      METs -- 3.3        T5 Nustep   Level -- 3      Minutes -- 15      METs -- 2.1        Oxygen   Maintain Oxygen Saturation -- 88% or higher         Exercise Comments:   Exercise Comments     Row Name 08/19/24 [phone]           Exercise Comments First full day of exercise!  Patient was oriented to gym and equipment including functions, settings, policies, and procedures.  Patient's individual exercise prescription and treatment plan were reviewed.  All starting workloads were established based on the results of the 6 minute walk test done at initial orientation visit.  The plan for exercise progression was also introduced and progression will be customized based on patient's performance and goals.          Exercise Goals and Review:   Exercise Goals     Row Name 08/12/24 1650             Exercise Goals   Increase Physical Activity Yes       Intervention Provide advice, education, support and counseling about physical activity/exercise needs.;Develop  an individualized exercise prescription for aerobic and resistive training based on initial evaluation findings, risk stratification, comorbidities and participant's personal goals.       Expected Outcomes Short Term: Attend rehab on a regular basis to increase amount of physical activity.;Long Term: Add in home exercise to make exercise part of routine and to increase amount of physical activity.;Long Term: Exercising regularly at least 3-5 days a week.       Increase Strength and Stamina Yes       Intervention Provide advice, education, support and counseling about physical activity/exercise needs.;Develop an individualized exercise prescription for aerobic and resistive training based on initial evaluation findings, risk stratification, comorbidities and participant's personal goals.       Expected Outcomes Short Term: Increase workloads from initial exercise prescription for resistance, speed, and METs.;Short Term: Perform resistance training exercises routinely during rehab and add in resistance training at home;Long Term: Improve cardiorespiratory fitness, muscular endurance and strength as measured by increased METs and functional capacity ( )       Able to understand and use rate of perceived exertion (RPE) scale Yes       Intervention Provide education and explanation on how to  use RPE scale       Expected Outcomes Short Term: Able to use RPE daily in rehab to express subjective intensity level;Long Term:  Able to use RPE to guide intensity level when exercising independently       Able to understand and use Dyspnea scale Yes       Intervention Provide education and explanation on how to use Dyspnea scale       Expected Outcomes Short Term: Able to use Dyspnea scale daily in rehab to express subjective sense of shortness of breath during exertion;Long Term: Able to use Dyspnea scale to guide intensity level when exercising independently       Knowledge and understanding of Target Heart Rate  Range (THRR) Yes       Intervention Provide education and explanation of THRR including how the numbers were predicted and where they are located for reference       Expected Outcomes Short Term: Able to state/look up THRR;Long Term: Able to use THRR to govern intensity when exercising independently;Short Term: Able to use daily as guideline for intensity in rehab       Able to check pulse independently Yes       Intervention Provide education and demonstration on how to check pulse in carotid and radial arteries.;Review the importance of being able to check your own pulse for safety during independent exercise       Expected Outcomes Short Term: Able to explain why pulse checking is important during independent exercise;Long Term: Able to check pulse independently and accurately       Understanding of Exercise Prescription Yes       Intervention Provide education, explanation, and written materials on patient's individual exercise prescription       Expected Outcomes Short Term: Able to explain program exercise prescription;Long Term: Able to explain home exercise prescription to exercise independently          Exercise Goals Re-Evaluation :  Exercise Goals Re-Evaluation     Row Name 08/19/24 9061 08/25/24 0749           Exercise Goal Re-Evaluation   Exercise Goals Review Increase Physical Activity;Able to understand and use rate of perceived exertion (RPE) scale;Knowledge and understanding of Target Heart Rate Range (THRR);Understanding of Exercise Prescription;Increase Strength and Stamina;Able to understand and use Dyspnea scale;Able to check pulse independently Increase Physical Activity;Increase Strength and Stamina;Understanding of Exercise Prescription      Comments Reviewed RPE and dyspnea scale, THR and program prescription with pt today.  Pt voiced understanding and was given a copy of goals to take home. Maralyn is off to a good start in the program, and was able to attend her first  session during this review period. During her session she was able to use the treadmill at and no incline, and the T6 nustep at level 3. We will continue to monitor her progress in the program.      Expected Outcomes Short: Use RPE daily to regulate intensity.  Long: Follow program prescription in THR. Short: Continue to follow exercise prescription. Long: Continue exercise to improve strength and stamina.         Discharge Exercise Prescription (Final Exercise Prescription Changes):  Exercise Prescription Changes - 08/25/24 0700       Response to Exercise   Blood Pressure (Admit) 104/64    Blood Pressure (Exercise) 118/62    Blood Pressure (Exit) 98/68    Heart Rate (Admit) 65 bpm    Heart Rate (Exercise)  113 bpm    Heart Rate (Exit) 93 bpm    Rating of Perceived Exertion (Exercise) 12    Perceived Dyspnea (Exercise) 0    Symptoms none    Comments first 2 weeks of exercise    Duration Progress to 30 minutes of  aerobic without signs/symptoms of physical distress    Intensity THRR unchanged      Progression   Progression Continue to progress workloads to maintain intensity without signs/symptoms of physical distress.    Average METs 2.7      Resistance Training   Weight 4lb    Reps 10-15      Interval Training   Interval Training No      Treadmill   MPH 3    Grade 0    Minutes 15    METs 3.3      T5 Nustep   Level 3    Minutes 15    METs 2.1      Oxygen   Maintain Oxygen Saturation 88% or higher          Nutrition:  Target Goals: Understanding of nutrition guidelines, daily intake of sodium 1500mg , cholesterol 200mg , calories 30% from fat and 7% or less from saturated fats, daily to have 5 or more servings of fruits and vegetables.  Education: Nutrition 1 -Group instruction provided by verbal, written material, interactive activities, discussions, models, and posters to present general guidelines for heart healthy nutrition including macronutrients,  label reading, and promoting whole foods over processed counterparts. Education serves as pensions consultant of discussion of heart healthy eating for all. Written material provided at class time.    Education: Nutrition 2 -Group instruction provided by verbal, written material, interactive activities, discussions, models, and posters to present general guidelines for heart healthy nutrition including sodium, cholesterol, and saturated fat. Providing guidance of habit forming to improve blood pressure, cholesterol, and body weight. Written material provided at class time.     Biometrics:  Pre Biometrics - 08/12/24 1651       Pre Biometrics   Height 5' 4.5 (1.638 m)    Weight 186 lb 9.6 oz (84.6 kg)    Waist Circumference 38.5 inches    Hip Circumference 42.5 inches    Waist to Hip Ratio 0.91 %    BMI (Calculated) 31.55    Single Leg Stand 15 seconds           Nutrition Therapy Plan and Nutrition Goals:   Nutrition Assessments:  MEDIFICTS Score Key: >=70 Need to make dietary changes  40-70 Heart Healthy Diet <= 40 Therapeutic Level Cholesterol Diet  Flowsheet Row Cardiac Rehab from 08/12/2024 in [facility] Cardiac and Pulmonary Rehab  Picture Your Plate Total Score on Admission 76   Picture Your Plate Scores: <59 Unhealthy dietary pattern with much room for improvement. 41-50 Dietary pattern unlikely to meet recommendations for good health and room for improvement. 51-60 More healthful dietary pattern, with some room for improvement.  >60 Healthy dietary pattern, although there may be some specific behaviors that could be improved.    Nutrition Goals Re-Evaluation:   Nutrition Goals Discharge (Final Nutrition Goals Re-Evaluation):   Psychosocial: Target Goals: Acknowledge presence or absence of significant depression and/or stress, maximize coping skills, provide positive support system. Participant is able to verbalize types and ability to use techniques and skills needed for  reducing stress and depression.   Education: Stress, Anxiety, and Depression - Group verbal and visual presentation to define topics covered.  Reviews how body is impacted  by stress, anxiety, and depression.  Also discusses healthy ways to reduce stress and to treat/manage anxiety and depression. Written material provided at class time.   Education: Sleep Hygiene -Provides group verbal and written instruction about how sleep can affect your health.  Define sleep hygiene, discuss sleep cycles and impact of sleep habits. Review good sleep hygiene tips.   Initial Review & Psychosocial Screening:  Initial Psych Review & Screening - 08/12/24 1452       Initial Review   Current issues with Current Depression;Current Psychotropic Meds;Current Stress Concerns;Current Sleep Concerns    Source of Stress Concerns Unable to perform yard/household activities;Occupation      Family Dynamics   Good Support System? Yes   husband and children     Barriers   Psychosocial barriers to participate in program The patient should benefit from training in stress management and relaxation.;There are no identifiable barriers or psychosocial needs.      Screening Interventions   Interventions Encouraged to exercise;To provide support and resources with identified psychosocial needs;Provide feedback about the scores to participant    Expected Outcomes Short Term goal: Utilizing psychosocial counselor, staff and physician to assist with identification of specific Stressors or current issues interfering with healing process. Setting desired goal for each stressor or current issue identified.;Long Term Goal: Stressors or current issues are controlled or eliminated.;Short Term goal: Identification and review with participant of any Quality of Life or Depression concerns found by scoring the questionnaire.;Long Term goal: The participant improves quality of Life and PHQ9 Scores as seen by post scores and/or verbalization of  changes          Quality of Life Scores:   Quality of Life - 08/12/24 1635       Quality of Life   Select Quality of Life      Quality of Life Scores   Health/Function Pre 13.83 %    Socioeconomic Pre 24.31 %    Psych/Spiritual Pre 16.5 %    Family Pre 26.4 %    GLOBAL Pre 18.56 %         Scores of 19 and below usually indicate a poorer quality of life in these areas.  A difference of  2-3 points is a clinically meaningful difference.  A difference of 2-3 points in the total score of the Quality of Life Index has been associated with significant improvement in overall quality of life, self-image, physical symptoms, and general health in studies assessing change in quality of life.  PHQ-9: Review Flowsheet       08/12/2024 07/30/2024  Depression screen PHQ 2/9  Decreased Interest 1 0  Down, Depressed, Hopeless 2 0  PHQ - 2 Score 3 0  Altered sleeping 3 -  Tired, decreased energy 3 -  Change in appetite 0 -  Feeling bad or failure about yourself  1 -  Trouble concentrating 2 -  Moving slowly or fidgety/restless 0 -  Suicidal thoughts 0 -  PHQ-9 Score 12 -  Difficult doing work/chores Somewhat difficult -   Interpretation of Total Score  Total Score Depression Severity:  1-4 = Minimal depression, 5-9 = Mild depression, 10-14 = Moderate depression, 15-19 = Moderately severe depression, 20-27 = Severe depression   Psychosocial Evaluation and Intervention:  Psychosocial Evaluation - 08/12/24 1557       Psychosocial Evaluation & Interventions   Interventions Stress management education;Relaxation education;Encouraged to exercise with the program and follow exercise prescription    Comments Irean is excited to start  the program as she had an unexpect cath with stent placement 3 weeks ago. She works full time as an airline pilot and per her doctor, must succesfully complete 3 weeks of rehab prior to starting back at work. Donell lives with her husband and one child and states  that she also has 2 children that live outside of the home and that her entire family has been very supportive during this event. Mkayla does have a history of depression and is currently on medication for same. She states that she enjoys reading, hiking and gardening to decompress. She does admit that her depression is worse this time of year and given the fact that she has decreased energy level following her cardiac event.    Expected Outcomes Short: Attend cardiac rehab for education and exercise. Long: Develop and maintain positive self care habits.    Continue Psychosocial Services  Follow up required by staff          Psychosocial Re-Evaluation:   Psychosocial Discharge (Final Psychosocial Re-Evaluation):   Vocational Rehabilitation: Provide vocational rehab assistance to qualifying candidates.   Vocational Rehab Evaluation & Intervention:  Vocational Rehab - 08/12/24 1606       Initial Vocational Rehab Evaluation & Intervention   Assessment shows need for Vocational Rehabilitation No          Education: Education Goals: Education classes will be provided on a variety of topics geared toward better understanding of heart health and risk factor modification. Participant will state understanding/return demonstration of topics presented as noted by education test scores.  Learning Barriers/Preferences:  Learning Barriers/Preferences - 08/12/24 1605       Learning Barriers/Preferences   Learning Barriers None    Learning Preferences Group Instruction;Individual Instruction;Pictoral;Skilled Demonstration;Verbal Instruction          General Cardiac Education Topics:  AED/CPR: - Group verbal and written instruction with the use of models to demonstrate the basic use of the AED with the basic ABC's of resuscitation.   Test and Procedures: - Group verbal and visual presentation and models provide information about basic cardiac anatomy and function. Reviews the testing  methods done to diagnose heart disease and the outcomes of the test results. Describes the treatment choices: Medical Management, Angioplasty, or Coronary Bypass Surgery for treating various heart conditions including Myocardial Infarction, Angina, Valve Disease, and Cardiac Arrhythmias. Written material provided at class time.   Medication Safety: - Group verbal and visual instruction to review commonly prescribed medications for heart and lung disease. Reviews the medication, class of the drug, and side effects. Includes the steps to properly store meds and maintain the prescription regimen. Written material provided at class time.   Intimacy: - Group verbal instruction through game format to discuss how heart and lung disease can affect sexual intimacy. Written material provided at class time.   Know Your Numbers and Heart Failure: - Group verbal and visual instruction to discuss disease risk factors for cardiac and pulmonary disease and treatment options.  Reviews associated critical values for Overweight/Obesity, Hypertension, Cholesterol, and Diabetes.  Discusses basics of heart failure: signs/symptoms and treatments.  Introduces Heart Failure Zone chart for action plan for heart failure. Written material provided at class time.   Infection Prevention: - Provides verbal and written material to individual with discussion of infection control including proper hand washing and proper equipment cleaning during exercise session. Flowsheet Row Cardiac Rehab from 08/12/2024 in [facility] Cardiac and Pulmonary Rehab  Date 08/12/24  Educator [facility]  Instruction Review Code 1- [employer]  Understanding    Falls Prevention: - Provides verbal and written material to individual with discussion of falls prevention and safety. Flowsheet Row Cardiac Rehab from 08/12/2024 in [facility] Cardiac and Pulmonary Rehab  Date 08/12/24  Educator [facility]  Instruction Review Code 1- Verbalizes Understanding    Other: -Provides  group and verbal instruction on various topics (see comments)   Knowledge Questionnaire Score:   Core Components/Risk Factors/Patient Goals at Admission:  Personal Goals and Risk Factors at Admission - 08/12/24 1604       Core Components/Risk Factors/Patient Goals on Admission    Weight Management Yes;Weight Loss    Intervention Weight Management: Develop a combined nutrition and exercise program designed to reach desired caloric intake, while maintaining appropriate intake of nutrient and fiber, sodium and fats, and appropriate energy expenditure required for the weight goal.;Weight Management: Provide education and appropriate resources to help participant work on and attain dietary goals.;Weight Management/Obesity: Establish reasonable short term and long term weight goals.;Obesity: Provide education and appropriate resources to help participant work on and attain dietary goals.    Goal Weight: Long Term 150 lb (68 kg)    Expected Outcomes Short Term: Continue to assess and modify interventions until short term weight is achieved;Long Term: Adherence to nutrition and physical activity/exercise program aimed toward attainment of established weight goal;Weight Maintenance: Understanding of the daily nutrition guidelines, which includes 25-35% calories from fat, 7% or less cal from saturated fats, less than 200mg  cholesterol, less than 1.5gm of sodium, & 5 or more servings of fruits and vegetables daily;Weight Loss: Understanding of general recommendations for a balanced deficit meal plan, which promotes 1-2 lb weight loss per week and includes a negative energy balance of [phone] kcal/d;Understanding recommendations for meals to include 15-35% energy as protein, 25-35% energy from fat, 35-60% energy from carbohydrates, less than 200mg  of dietary cholesterol, 20-35 gm of total fiber daily;Understanding of distribution of calorie intake throughout the day with the consumption of 4-5 meals/snacks     Hypertension Yes    Intervention Provide education on lifestyle modifcations including regular physical activity/exercise, weight management, moderate sodium restriction and increased consumption of fresh fruit, vegetables, and low fat dairy, alcohol moderation, and smoking cessation.;Monitor prescription use compliance.    Expected Outcomes Short Term: Continued assessment and intervention until BP is < 140/14mm HG in hypertensive participants. < 130/72mm HG in hypertensive participants with diabetes, heart failure or chronic kidney disease.;Long Term: Maintenance of blood pressure at goal levels.    Lipids Yes    Intervention Provide education and support for participant on nutrition & aerobic/resistive exercise along with prescribed medications to achieve LDL 70mg , HDL >40mg .    Expected Outcomes Short Term: Participant states understanding of desired cholesterol values and is compliant with medications prescribed. Participant is following exercise prescription and nutrition guidelines.;Long Term: Cholesterol controlled with medications as prescribed, with individualized exercise RX and with personalized nutrition plan. Value goals: LDL < 70mg , HDL > 40 mg.          Education:Diabetes - Individual verbal and written instruction to review signs/symptoms of diabetes, desired ranges of glucose level fasting, after meals and with exercise. Acknowledge that pre and post exercise glucose checks will be done for 3 sessions at entry of program.   Core Components/Risk Factors/Patient Goals Review:    Core Components/Risk Factors/Patient Goals at Discharge (Final Review):    ITP Comments:  ITP Comments     Row Name 08/12/24 1550 08/19/24 0936 08/25/24 1337 09/07/24 1124     ITP  Comments Completed program orientation and . Initial ITP created and sent for review to Medical Director. First full day of exercise!  Patient was oriented to gym and equipment including functions, settings, policies,  and procedures.  Patient's individual exercise prescription and treatment plan were reviewed.  All starting workloads were established based on the results of the 6 minute walk test done at initial orientation visit.  The plan for exercise progression was also introduced and progression will be customized based on patient's performance and goals. 30 Day review completed. Medical Director ITP review done, changes made as directed, and signed approval by Medical Director. New to program Program in [place] called to state patient wishes to transfer there due to that location being closer to her. She will attend the program there. She completed 5 of 36 sessions.       Comments: Early Discharge ITP/Transferring to different location     [1]  Current Outpatient Medications:    acetaminophen  (TYLENOL ) 500 MG tablet, Take 500 mg by mouth daily as needed for moderate pain (pain score 4-6), mild pain (pain score 1-3) or headache., Disp: , Rfl:    ALPRAZolam  (XANAX ) 0.25 MG tablet, Take 0.25 mg by mouth daily as needed for anxiety., Disp: , Rfl:    aspirin  EC 81 MG tablet, Take 81 mg by mouth daily. Swallow whole., Disp: , Rfl:    buPROPion  (WELLBUTRIN  XL) 300 MG 24 hr tablet, Take 300 mg by mouth daily., Disp: , Rfl: 0   clopidogrel  (PLAVIX ) 75 MG tablet, Take 1 tablet (75 mg total) by mouth daily., Disp: 90 tablet, Rfl: 3   levothyroxine  (SYNTHROID ) 112 MCG tablet, Take 112 mcg by mouth daily before breakfast., Disp: , Rfl:    liothyronine  (CYTOMEL ) 5 MCG tablet, Take 5 mcg by mouth daily., Disp: , Rfl:    losartan -hydrochlorothiazide  (HYZAAR) 100-25 MG tablet, Take 1 tablet by mouth daily., Disp: , Rfl:    metoprolol  succinate (TOPROL  XL) 25 MG 24 hr tablet, Take 1 tablet (25 mg total) by mouth daily., Disp: 90 tablet, Rfl: 3   montelukast (SINGULAIR) 10 MG tablet, Take 10 mg by mouth daily as needed (allergies)., Disp: , Rfl:    Multiple Vitamin (MULTIVITAMIN) capsule, Take 1 capsule by mouth daily.  One a day 50+, Disp: , Rfl:    nitroGLYCERIN  (NITROSTAT ) 0.4 MG SL tablet, Place 1 tablet (0.4 mg total) under the tongue every 5 (five) minutes as needed for chest pain. (Patient not taking: Reported on 07/30/2024), Disp: 25 tablet, Rfl: 2   rosuvastatin (CRESTOR) 20 MG tablet, Take 20 mg by mouth daily., Disp: , Rfl:  [2]  Social History Tobacco Use  Smoking Status Former   Current packs/day: 0.00   Average packs/day: 0.3 packs/day for 20.0 years (5.0 ttl pk-yrs)   Types: Cigarettes   Start date: 12/25/1993   Quit date: 12/25/2013   Years since quitting: 10.7  Smokeless Tobacco Never

## 2024-09-07 NOTE — Progress Notes (Signed)
 Program Transfer Doris Davis 01/27/1971  Doris Davis is discharging early due to attending the program at a different location which is closer to home. She completed 5 of 36 sessions.    6 Minute Walk     Row Name 08/12/24 1646         6 Minute Walk   Phase Initial     Distance 1560 feet     Walk Time 6 minutes     # of Rest Breaks 0     MPH 2.95     METS 4.1     RPE 9     Perceived Dyspnea  0     VO2 Peak 14.37     Symptoms No     Resting HR 75 bpm     Resting BP 122/74     Resting Oxygen Saturation  96 %     Exercise Oxygen Saturation  during 6 min walk 99 %     Max Ex. HR 118 bpm     Max Ex. BP 134/76     2 Minute Post BP 126/76

## 2024-09-07 NOTE — Telephone Encounter (Signed)
 Called patient to see if she was interested in participating in the Cardiac Rehab Program. Patient will come in for orientation on 1/06 and will attend the 10:15 exercise class on Mondays & Wednesdays.  Patient is aware we will check her insurance after 1/01, and that she will be TCR with 28 out of 36 total sessions to use (after orientation is complete).  Sent MyChart message.

## 2024-09-08 ENCOUNTER — Telehealth (HOSPITAL_COMMUNITY): Payer: Self-pay

## 2024-09-08 NOTE — Telephone Encounter (Signed)
 Appointment confirmed for Cardiac Rehab Orientation. Instructions given, all questions answered. Nursing pre-assessment completed.

## 2024-09-09 ENCOUNTER — Encounter

## 2024-09-10 ENCOUNTER — Telehealth (HOSPITAL_COMMUNITY): Payer: Self-pay

## 2024-09-10 NOTE — Telephone Encounter (Signed)
 Pt insurance is active and benefits verified through [employer] Co-pay 0, DED $4,000/$0 met, out of pocket $4,000/$0 met, co-insurance 0%. no pre-authorization required, Ralph/[employer]-Chat 09/10/2024@1 :34, REF# [id number]   TCR/ICR? ICR Visit(date of service)limitation? No limit Can multiple codes be used on the same date of service/visit?(IF ITS A LIMIT) n/a    Is this a lifetime maximum or an annual maximum? annual Has the member used any of these services to date? no Is there a time limit (weeks/months) on start of program and/or program completion? no

## 2024-09-14 ENCOUNTER — Encounter

## 2024-09-14 ENCOUNTER — Encounter (HOSPITAL_COMMUNITY)
Admission: RE | Admit: 2024-09-14 | Discharge: 2024-09-14 | Disposition: A | Discharge status: Home / Self Care | Source: Ambulatory Visit | Attending: Cardiology | Admitting: Cardiology

## 2024-09-14 ENCOUNTER — Encounter (HOSPITAL_COMMUNITY): Payer: Self-pay

## 2024-09-14 VITALS — BP 106/78 | HR 56 | Ht 64.75 in | Wt 186.3 lb

## 2024-09-14 DIAGNOSIS — Z48812 Encounter for surgical aftercare following surgery on the circulatory system: Secondary | ICD-10-CM | POA: Insufficient documentation

## 2024-09-14 DIAGNOSIS — Z955 Presence of coronary angioplasty implant and graft: Secondary | ICD-10-CM | POA: Diagnosis not present

## 2024-09-14 HISTORY — DX: Atherosclerotic heart disease of native coronary artery without angina pectoris: I25.10

## 2024-09-14 NOTE — Progress Notes (Signed)
 Cardiac Rehab Medication Review by a Nurse  Does the patient  feel that his/her medications are working for him/her?  yes  Has the patient been experiencing any side effects to the medications prescribed?  yes  Does the patient measure his/her own blood pressure or blood glucose at home?  no   Does the patient have any problems obtaining medications due to transportation or finances?   no  Understanding of regimen: excellent Understanding of indications: excellent Potential of compliance: excellent    Nurse comments: Doris Davis is taking her medication as prescribed and has a good understanding of what her medications are for. Doris Davis says that she has experienced more bruising since she has been taking plavix .    Doris Davis [facility] RN 09/14/2024 10:55 AM

## 2024-09-14 NOTE — Progress Notes (Signed)
 Cardiac Individual Treatment Plan  Patient Details  Name: Doris Davis MRN: [id number] Date of Birth: 1970/12/06 Referring Provider:   Flowsheet Row CARDIAC REHAB PHASE II ORIENTATION from 09/14/2024 in [facility]  Referring Provider Anner Alm ORN, MD    Initial Encounter Date:  Flowsheet Row CARDIAC REHAB PHASE II ORIENTATION from 09/14/2024 in [facility]  Date 09/14/24    Visit Diagnosis: 07/20/24 DES RCA  Patient's Home Medications on Admission: Current Medications[1]  Past Medical History: Past Medical History:  Diagnosis Date   Allergy    Anemia    Anxiety    Arthritis    Asthma    with contact with cats   BV (bacterial vaginosis) 08/09/2013   Coronary artery disease    Family history of adverse reaction to anesthesia    mother was sick for days   Hyperlipidemia    Hypertension    PONV (postoperative nausea and vomiting)    sts with ablation she was given meds for nausea and got sicker.   Vaginal bleeding 08/09/2013   Had supra cervical hyst 2012, has had bleeding after sex   Venereal disease 1990   chlamydia   Wears glasses     Tobacco Use: Tobacco Use History[2]  Labs: Review Flowsheet       Latest Ref Rng & Units 04/12/2013  Labs for ITP Cardiac and Pulmonary Rehab  TCO2 0 - 100 mmol/L 24     Capillary Blood Glucose: No results found for: GLUCAP   Exercise Target Goals: Exercise Program Goal: Individual exercise prescription set using results from initial 6 min walk test and THRR while considering  patients activity barriers and safety.   Exercise Prescription Goal: Initial exercise prescription builds to 30-45 minutes a day of aerobic activity, 2-3 days per week.  Home exercise guidelines will be given to patient during program as part of exercise prescription that the participant will acknowledge.  Activity Barriers &  Risk Stratification:  Activity Barriers & Cardiac Risk Stratification - 09/14/24 1133       Activity Barriers & Cardiac Risk Stratification   Activity Barriers Back Problems;Other (comment)    Comments Spinal stenosis, chronic back pain, trigger finger- right middle digit.    Cardiac Risk Stratification Moderate          6 Minute Walk:  6 Minute Walk     Row Name 08/12/24 1646 09/14/24 1301       6 Minute Walk   Phase Initial Initial    Distance 1560 feet 1789 feet    Walk Time 6 minutes 6 minutes    # of Rest Breaks 0 0    MPH 2.95 3.39    METS 4.1 4.26    RPE 9 10    Perceived Dyspnea  0 0    VO2 Peak 14.37 14.9    Symptoms No No    Resting HR 75 bpm 56 bpm    Resting BP 122/74 106/78    Resting Oxygen Saturation  96 % 98 %    Exercise Oxygen Saturation  during 6 min walk 99 % 99 %    Max Ex. HR 118 bpm 95 bpm    Max Ex. BP 134/76 128/82    2 Minute Post BP 126/76 112/78       Oxygen Initial Assessment:   Oxygen Re-Evaluation:   Oxygen Discharge (Final Oxygen Re-Evaluation):   Initial Exercise  Prescription:  Initial Exercise Prescription - 09/14/24 1100       Date of Initial Exercise RX and Referring Provider   Date 09/14/24    Referring Provider Anner Alm ORN, MD    Expected Discharge Date 11/03/24      Treadmill   MPH 3.1    Grade 1.5    Minutes 15    METs 4.01      Rower   Level 5    Watts 20    Minutes 15    METs 4.37      Prescription Details   Frequency (times per week) 3    Duration Progress to 30 minutes of continuous aerobic without signs/symptoms of physical distress      Intensity   THRR 40-80% of Max Heartrate 66-132    Ratings of Perceived Exertion 11-13    Perceived Dyspnea 0-4      Progression   Progression Continue to progress workloads to maintain intensity without signs/symptoms of physical distress.      Resistance Training   Training Prescription Yes    Weight 4 lbs    Reps 10-15          Perform  Capillary Blood Glucose checks as needed.  Exercise Prescription Changes:   Exercise Prescription Changes     Row Name 08/12/24 1600 08/25/24 0700           Response to Exercise   Blood Pressure (Admit) 122/74 104/64      Blood Pressure (Exercise) 134/76 118/62      Blood Pressure (Exit) 126/76 98/68      Heart Rate (Admit) 75 bpm 65 bpm      Heart Rate (Exercise) 118 bpm 113 bpm      Heart Rate (Exit) 92 bpm 93 bpm      Oxygen Saturation (Admit) 96 % --      Oxygen Saturation (Exercise) 99 % --      Oxygen Saturation (Exit) 99 % --      Rating of Perceived Exertion (Exercise) 9 12      Perceived Dyspnea (Exercise) 0 0      Symptoms none none      Comments results first 2 weeks of exercise      Duration -- Progress to 30 minutes of  aerobic without signs/symptoms of physical distress      Intensity -- THRR unchanged        Progression   Progression -- Continue to progress workloads to maintain intensity without signs/symptoms of physical distress.      Average METs -- 2.7        Resistance Training   Weight -- 4lb      Reps -- 10-15        Interval Training   Interval Training -- No        Treadmill   MPH -- 3      Grade -- 0      Minutes -- 15      METs -- 3.3        T5 Nustep   Level -- 3      Minutes -- 15      METs -- 2.1        Oxygen   Maintain Oxygen Saturation -- 88% or higher         Exercise Comments:   Exercise Comments     Row Name 08/19/24 [phone]           Exercise Comments First full  day of exercise!  Patient was oriented to gym and equipment including functions, settings, policies, and procedures.  Patient's individual exercise prescription and treatment plan were reviewed.  All starting workloads were established based on the results of the 6 minute walk test done at initial orientation visit.  The plan for exercise progression was also introduced and progression will be customized based on patient's performance and goals.           Exercise Goals and Review:   Exercise Goals     Row Name 08/12/24 1650 09/14/24 1133           Exercise Goals   Increase Physical Activity Yes Yes      Intervention Provide advice, education, support and counseling about physical activity/exercise needs.;Develop an individualized exercise prescription for aerobic and resistive training based on initial evaluation findings, risk stratification, comorbidities and participant's personal goals. Provide advice, education, support and counseling about physical activity/exercise needs.;Develop an individualized exercise prescription for aerobic and resistive training based on initial evaluation findings, risk stratification, comorbidities and participant's personal goals.      Expected Outcomes Short Term: Attend rehab on a regular basis to increase amount of physical activity.;Long Term: Add in home exercise to make exercise part of routine and to increase amount of physical activity.;Long Term: Exercising regularly at least 3-5 days a week. Short Term: Attend rehab on a regular basis to increase amount of physical activity.;Long Term: Add in home exercise to make exercise part of routine and to increase amount of physical activity.;Long Term: Exercising regularly at least 3-5 days a week.      Increase Strength and Stamina Yes Yes      Intervention Provide advice, education, support and counseling about physical activity/exercise needs.;Develop an individualized exercise prescription for aerobic and resistive training based on initial evaluation findings, risk stratification, comorbidities and participant's personal goals. Provide advice, education, support and counseling about physical activity/exercise needs.;Develop an individualized exercise prescription for aerobic and resistive training based on initial evaluation findings, risk stratification, comorbidities and participant's personal goals.      Expected Outcomes Short Term: Increase workloads  from initial exercise prescription for resistance, speed, and METs.;Short Term: Perform resistance training exercises routinely during rehab and add in resistance training at home;Long Term: Improve cardiorespiratory fitness, muscular endurance and strength as measured by increased METs and functional capacity ( ) Short Term: Increase workloads from initial exercise prescription for resistance, speed, and METs.;Short Term: Perform resistance training exercises routinely during rehab and add in resistance training at home;Long Term: Improve cardiorespiratory fitness, muscular endurance and strength as measured by increased METs and functional capacity ( )      Able to understand and use rate of perceived exertion (RPE) scale Yes Yes      Intervention Provide education and explanation on how to use RPE scale Provide education and explanation on how to use RPE scale      Expected Outcomes Short Term: Able to use RPE daily in rehab to express subjective intensity level;Long Term:  Able to use RPE to guide intensity level when exercising independently Short Term: Able to use RPE daily in rehab to express subjective intensity level;Long Term:  Able to use RPE to guide intensity level when exercising independently      Able to understand and use Dyspnea scale Yes Yes      Intervention Provide education and explanation on how to use Dyspnea scale Provide education and explanation on how to use Dyspnea scale      Expected  Outcomes Short Term: Able to use Dyspnea scale daily in rehab to express subjective sense of shortness of breath during exertion;Long Term: Able to use Dyspnea scale to guide intensity level when exercising independently Short Term: Able to use Dyspnea scale daily in rehab to express subjective sense of shortness of breath during exertion;Long Term: Able to use Dyspnea scale to guide intensity level when exercising independently      Knowledge and understanding of Target Heart Rate Range (THRR)  Yes Yes      Intervention Provide education and explanation of THRR including how the numbers were predicted and where they are located for reference Provide education and explanation of THRR including how the numbers were predicted and where they are located for reference      Expected Outcomes Short Term: Able to state/look up THRR;Long Term: Able to use THRR to govern intensity when exercising independently;Short Term: Able to use daily as guideline for intensity in rehab Short Term: Able to state/look up THRR;Long Term: Able to use THRR to govern intensity when exercising independently;Short Term: Able to use daily as guideline for intensity in rehab      Able to check pulse independently Yes Yes      Intervention Provide education and demonstration on how to check pulse in carotid and radial arteries.;Review the importance of being able to check your own pulse for safety during independent exercise Provide education and demonstration on how to check pulse in carotid and radial arteries.;Review the importance of being able to check your own pulse for safety during independent exercise      Expected Outcomes Short Term: Able to explain why pulse checking is important during independent exercise;Long Term: Able to check pulse independently and accurately Short Term: Able to explain why pulse checking is important during independent exercise;Long Term: Able to check pulse independently and accurately      Understanding of Exercise Prescription Yes Yes      Intervention Provide education, explanation, and written materials on patient's individual exercise prescription Provide education, explanation, and written materials on patient's individual exercise prescription      Expected Outcomes Short Term: Able to explain program exercise prescription;Long Term: Able to explain home exercise prescription to exercise independently Short Term: Able to explain program exercise prescription;Long Term: Able to explain  home exercise prescription to exercise independently         Exercise Goals Re-Evaluation :  Exercise Goals Re-Evaluation     Row Name 08/19/24 9061 08/25/24 0749           Exercise Goal Re-Evaluation   Exercise Goals Review Increase Physical Activity;Able to understand and use rate of perceived exertion (RPE) scale;Knowledge and understanding of Target Heart Rate Range (THRR);Understanding of Exercise Prescription;Increase Strength and Stamina;Able to understand and use Dyspnea scale;Able to check pulse independently Increase Physical Activity;Increase Strength and Stamina;Understanding of Exercise Prescription      Comments Reviewed RPE and dyspnea scale, THR and program prescription with pt today.  Pt voiced understanding and was given a copy of goals to take home. Doris Davis is off to a good start in the program, and was able to attend her first session during this review period. During her session she was able to use the treadmill at and no incline, and the T6 nustep at level 3. We will continue to monitor her progress in the program.      Expected Outcomes Short: Use RPE daily to regulate intensity.  Long: Follow program prescription in THR. Short: Continue  to follow exercise prescription. Long: Continue exercise to improve strength and stamina.         Discharge Exercise Prescription (Final Exercise Prescription Changes):  Exercise Prescription Changes - 08/25/24 0700       Response to Exercise   Blood Pressure (Admit) 104/64    Blood Pressure (Exercise) 118/62    Blood Pressure (Exit) 98/68    Heart Rate (Admit) 65 bpm    Heart Rate (Exercise) 113 bpm    Heart Rate (Exit) 93 bpm    Rating of Perceived Exertion (Exercise) 12    Perceived Dyspnea (Exercise) 0    Symptoms none    Comments first 2 weeks of exercise    Duration Progress to 30 minutes of  aerobic without signs/symptoms of physical distress    Intensity THRR unchanged      Progression   Progression Continue to  progress workloads to maintain intensity without signs/symptoms of physical distress.    Average METs 2.7      Resistance Training   Weight 4lb    Reps 10-15      Interval Training   Interval Training No      Treadmill   MPH 3    Grade 0    Minutes 15    METs 3.3      T5 Nustep   Level 3    Minutes 15    METs 2.1      Oxygen   Maintain Oxygen Saturation 88% or higher          Nutrition:  Target Goals: Understanding of nutrition guidelines, daily intake of sodium 1500mg , cholesterol 200mg , calories 30% from fat and 7% or less from saturated fats, daily to have 5 or more servings of fruits and vegetables.  Biometrics:  Pre Biometrics - 09/14/24 1015       Pre Biometrics   Waist Circumference 39.5 inches    Hip Circumference 45.5 inches    Waist to Hip Ratio 0.87 %    Triceps Skinfold 29 mm    % Body Fat 41.6 %    Grip Strength 10 kg    Flexibility --   Not performed. Chronic back pain, spinal stenosis.   Single Leg Stand 30 seconds           Nutrition Therapy Plan and Nutrition Goals:   Nutrition Assessments:  MEDIFICTS Score Key: >=70 Need to make dietary changes  40-70 Heart Healthy Diet <= 40 Therapeutic Level Cholesterol Diet   Flowsheet Row Cardiac Rehab from 08/12/2024 in [facility] Cardiac and Pulmonary Rehab  Picture Your Plate Total Score on Admission 76   Picture Your Plate Scores: <59 Unhealthy dietary pattern with much room for improvement. 41-50 Dietary pattern unlikely to meet recommendations for good health and room for improvement. 51-60 More healthful dietary pattern, with some room for improvement.  >60 Healthy dietary pattern, although there may be some specific behaviors that could be improved.    Nutrition Goals Re-Evaluation:   Nutrition Goals Re-Evaluation:   Nutrition Goals Discharge (Final Nutrition Goals Re-Evaluation):   Psychosocial: Target Goals: Acknowledge presence or absence of significant depression and/or  stress, maximize coping skills, provide positive support system. Participant is able to verbalize types and ability to use techniques and skills needed for reducing stress and depression.  Initial Review & Psychosocial Screening:  Initial Psych Review & Screening - 09/14/24 1213       Initial Review   Current issues with History of Depression;Current Depression;Current Psychotropic Meds;Current Stress Concerns  Source of Stress Concerns Chronic Illness    Comments Doris Davis says she has experienced some depression after having her cardiac stent placement. Doris Davis says she has OCD. Doris Davis has seen a counselor in the past. Doris Davis says she does not need counseling currently. Tyauna is taking wellbutrin  which is controlling her depression. Doris Davis says she feels tired all the time Doris Davis has mentioned this to her cardiologist and her PCP      Family Dynamics   Good Support System? Yes   Doris Davis has her husband and 3 children for support. Doris Davis also has her father who lives at a assisted living facility     Barriers   Psychosocial barriers to participate in program The patient should benefit from training in stress management and relaxation.      Screening Interventions   Interventions Encouraged to exercise;To provide support and resources with identified psychosocial needs;Provide feedback about the scores to participant    Expected Outcomes Long Term Goal: Stressors or current issues are controlled or eliminated.;Long Term goal: The participant improves quality of Life and PHQ9 Scores as seen by post scores and/or verbalization of changes;Short Term goal: Identification and review with participant of any Quality of Life or Depression concerns found by scoring the questionnaire.          Quality of Life Scores:  Quality of Life - 09/14/24 1129       Quality of Life   Select Quality of Life      Quality of Life Scores   Health/Function Pre 20.07 %    Socioeconomic Pre 24.43 %    Psych/Spiritual  Pre 22.71 %    Family Pre 26.4 %    GLOBAL Pre 22.44 %         Scores of 19 and below usually indicate a poorer quality of life in these areas.  A difference of  2-3 points is a clinically meaningful difference.  A difference of 2-3 points in the total score of the Quality of Life Index has been associated with significant improvement in overall quality of life, self-image, physical symptoms, and general health in studies assessing change in quality of life.  PHQ-9: Review Flowsheet       09/14/2024 08/12/2024 07/30/2024  Depression screen PHQ 2/9  Decreased Interest 1 1 0  Down, Depressed, Hopeless 1 2 0  PHQ - 2 Score 2 3 0  Altered sleeping 1 3 -  Tired, decreased energy 3 3 -  Change in appetite 0 0 -  Feeling bad or failure about yourself  2 1 -  Trouble concentrating 0 2 -  Moving slowly or fidgety/restless 0 0 -  Suicidal thoughts 0 0 -  PHQ-9 Score 8 12 -  Difficult doing work/chores Somewhat difficult Somewhat difficult -   Interpretation of Total Score  Total Score Depression Severity:  1-4 = Minimal depression, 5-9 = Mild depression, 10-14 = Moderate depression, 15-19 = Moderately severe depression, 20-27 = Severe depression   Psychosocial Evaluation and Intervention:  Psychosocial Evaluation - 08/12/24 1557       Psychosocial Evaluation & Interventions   Interventions Stress management education;Relaxation education;Encouraged to exercise with the program and follow exercise prescription    Comments Doris Davis is excited to start the program as she had an unexpect cath with stent placement 3 weeks ago. She works full time as an airline pilot and per her doctor, must succesfully complete 3 weeks of rehab prior to starting back at work. Doris Davis lives with her husband and one child  and states that she also has 2 children that live outside of the home and that her entire family has been very supportive during this event. Sherika does have a history of depression and is currently on  medication for same. She states that she enjoys reading, hiking and gardening to decompress. She does admit that her depression is worse this time of year and given the fact that she has decreased energy level following her cardiac event.    Expected Outcomes Short: Attend cardiac rehab for education and exercise. Long: Develop and maintain positive self care habits.    Continue Psychosocial Services  Follow up required by staff          Psychosocial Re-Evaluation:   Psychosocial Discharge (Final Psychosocial Re-Evaluation):   Vocational Rehabilitation: Provide vocational rehab assistance to qualifying candidates.   Vocational Rehab Evaluation & Intervention:  Vocational Rehab - 09/14/24 1256       Initial Vocational Rehab Evaluation & Intervention   Assessment shows need for Vocational Rehabilitation No   Doris Davis works full time and does not need vocational rehab at this time         Education: Education Goals: Education classes will be provided on a weekly basis, covering required topics. Participant will state understanding/return demonstration of topics presented.     Core Videos: Exercise    Move It!  Clinical staff conducted group or individual video education with verbal and written material and guidebook.  Patient learns the recommended Pritikin exercise program. Exercise with the goal of living a long, healthy life. Some of the health benefits of exercise include controlled diabetes, healthier blood pressure levels, improved cholesterol levels, improved heart and lung capacity, improved sleep, and better body composition. Everyone should speak with their doctor before starting or changing an exercise routine.  Biomechanical Limitations Clinical staff conducted group or individual video education with verbal and written material and guidebook.  Patient learns how biomechanical limitations can impact exercise and how we can mitigate and possibly overcome limitations to  have an impactful and balanced exercise routine.  Body Composition Clinical staff conducted group or individual video education with verbal and written material and guidebook.  Patient learns that body composition (ratio of muscle mass to fat mass) is a key component to assessing overall fitness, rather than body weight alone. Increased fat mass, especially visceral belly fat, can put us  at increased risk for metabolic syndrome, type 2 diabetes, heart disease, and even death. It is recommended to combine diet and exercise (cardiovascular and resistance training) to improve your body composition. Seek guidance from your physician and exercise physiologist before implementing an exercise routine.  Exercise Action Plan Clinical staff conducted group or individual video education with verbal and written material and guidebook.  Patient learns the recommended strategies to achieve and enjoy long-term exercise adherence, including variety, self-motivation, self-efficacy, and positive decision making. Benefits of exercise include fitness, good health, weight management, more energy, better sleep, less stress, and overall well-being.  Medical   Heart Disease Risk Reduction Clinical staff conducted group or individual video education with verbal and written material and guidebook.  Patient learns our heart is our most vital organ as it circulates oxygen, nutrients, white blood cells, and hormones throughout the entire body, and carries waste away. Data supports a plant-based eating plan like the Pritikin Program for its effectiveness in slowing progression of and reversing heart disease. The video provides a number of recommendations to address heart disease.   Metabolic Syndrome and Belly Fat  Clinical  staff conducted group or individual video education with verbal and written material and guidebook.  Patient learns what metabolic syndrome is, how it leads to heart disease, and how one can reverse it and  keep it from coming back. You have metabolic syndrome if you have 3 of the following 5 criteria: abdominal obesity, high blood pressure, high triglycerides, low HDL cholesterol, and high blood sugar.  Hypertension and Heart Disease Clinical staff conducted group or individual video education with verbal and written material and guidebook.  Patient learns that high blood pressure, or hypertension, is very common in the [place] . Hypertension is largely due to excessive salt intake, but other important risk factors include being overweight, physical inactivity, drinking too much alcohol, smoking, and not eating enough potassium from fruits and vegetables. High blood pressure is a leading risk factor for heart attack, stroke, congestive heart failure, dementia, kidney failure, and premature death. Long-term effects of excessive salt intake include stiffening of the arteries and thickening of heart muscle and organ damage. Recommendations include ways to reduce hypertension and the risk of heart disease.  Diseases of Our Time - Focusing on Diabetes Clinical staff conducted group or individual video education with verbal and written material and guidebook.  Patient learns why the best way to stop diseases of our time is prevention, through food and other lifestyle changes. Medicine (such as prescription pills and surgeries) is often only a Band-Aid on the problem, not a long-term solution. Most common diseases of our time include obesity, type 2 diabetes, hypertension, heart disease, and cancer. The Pritikin Program is recommended and has been proven to help reduce, reverse, and/or prevent the damaging effects of metabolic syndrome.  Nutrition   Overview of the Pritikin Eating Plan  Clinical staff conducted group or individual video education with verbal and written material and guidebook.  Patient learns about the Pritikin Eating Plan for disease risk reduction. The Pritikin Eating Plan emphasizes a  wide variety of unrefined, minimally-processed carbohydrates, like fruits, vegetables, whole grains, and legumes. Go, Caution, and Stop food choices are explained. Plant-based and lean animal proteins are emphasized. Rationale provided for low sodium intake for blood pressure control, low added sugars for blood sugar stabilization, and low added fats and oils for coronary artery disease risk reduction and weight management.  Calorie Density  Clinical staff conducted group or individual video education with verbal and written material and guidebook.  Patient learns about calorie density and how it impacts the Pritikin Eating Plan. Knowing the characteristics of the food you choose will help you decide whether those foods will lead to weight gain or weight loss, and whether you want to consume more or less of them. Weight loss is usually a side effect of the Pritikin Eating Plan because of its focus on low calorie-dense foods.  Label Reading  Clinical staff conducted group or individual video education with verbal and written material and guidebook.  Patient learns about the Pritikin recommended label reading guidelines and corresponding recommendations regarding calorie density, added sugars, sodium content, and whole grains.  Dining Out - Part 1  Clinical staff conducted group or individual video education with verbal and written material and guidebook.  Patient learns that restaurant meals can be sabotaging because they can be so high in calories, fat, sodium, and/or sugar. Patient learns recommended strategies on how to positively address this and avoid unhealthy pitfalls.  Facts on Fats  Clinical staff conducted group or individual video education with verbal and written material and guidebook.  Patient learns that lifestyle modifications can be just as effective, if not more so, as many medications for lowering your risk of heart disease. A Pritikin lifestyle can help to reduce your risk of  inflammation and atherosclerosis (cholesterol build-up, or plaque, in the artery walls). Lifestyle interventions such as dietary choices and physical activity address the cause of atherosclerosis. A review of the types of fats and their impact on blood cholesterol levels, along with dietary recommendations to reduce fat intake is also included.  Nutrition Action Plan  Clinical staff conducted group or individual video education with verbal and written material and guidebook.  Patient learns how to incorporate Pritikin recommendations into their lifestyle. Recommendations include planning and keeping personal health goals in mind as an important part of their success.  Healthy Mind-Set    Healthy Minds, Bodies, Hearts  Clinical staff conducted group or individual video education with verbal and written material and guidebook.  Patient learns how to identify when they are stressed. Video will discuss the impact of that stress, as well as the many benefits of stress management. Patient will also be introduced to stress management techniques. The way we think, act, and feel has an impact on our hearts.  How Our Thoughts Can Heal Our Hearts  Clinical staff conducted group or individual video education with verbal and written material and guidebook.  Patient learns that negative thoughts can cause depression and anxiety. This can result in negative lifestyle behavior and serious health problems. Cognitive behavioral therapy is an effective method to help control our thoughts in order to change and improve our emotional outlook.  Additional Videos:  Exercise    Improving Performance  Clinical staff conducted group or individual video education with verbal and written material and guidebook.  Patient learns to use a non-linear approach by alternating intensity levels and lengths of time spent exercising to help burn more calories and lose more body fat. Cardiovascular exercise helps improve heart health,  metabolism, hormonal balance, blood sugar control, and recovery from fatigue. Resistance training improves strength, endurance, balance, coordination, reaction time, metabolism, and muscle mass. Flexibility exercise improves circulation, posture, and balance. Seek guidance from your physician and exercise physiologist before implementing an exercise routine and learn your capabilities and proper form for all exercise.  Introduction to Yoga  Clinical staff conducted group or individual video education with verbal and written material and guidebook.  Patient learns about yoga, a discipline of the coming together of mind, breath, and body. The benefits of yoga include improved flexibility, improved range of motion, better posture and core strength, increased lung function, weight loss, and positive self-image. Yogas heart health benefits include lowered blood pressure, healthier heart rate, decreased cholesterol and triglyceride levels, improved immune function, and reduced stress. Seek guidance from your physician and exercise physiologist before implementing an exercise routine and learn your capabilities and proper form for all exercise.  Medical   Aging: Enhancing Your Quality of Life  Clinical staff conducted group or individual video education with verbal and written material and guidebook.  Patient learns key strategies and recommendations to stay in good physical health and enhance quality of life, such as prevention strategies, having an advocate, securing a Health Care Proxy and Power of Attorney, and keeping a list of medications and system for tracking them. It also discusses how to avoid risk for bone loss.  Biology of Weight Control  Clinical staff conducted group or individual video education with verbal and written material and guidebook.  Patient learns that  weight gain occurs because we consume more calories than we burn (eating more, moving less). Even if your body weight is normal, you  may have higher ratios of fat compared to muscle mass. Too much body fat puts you at increased risk for cardiovascular disease, heart attack, stroke, type 2 diabetes, and obesity-related cancers. In addition to exercise, following the Pritikin Eating Plan can help reduce your risk.  Decoding Lab Results  Clinical staff conducted group or individual video education with verbal and written material and guidebook.  Patient learns that lab test reflects one measurement whose values change over time and are influenced by many factors, including medication, stress, sleep, exercise, food, hydration, pre-existing medical conditions, and more. It is recommended to use the knowledge from this video to become more involved with your lab results and evaluate your numbers to speak with your doctor.   Diseases of Our Time - Overview  Clinical staff conducted group or individual video education with verbal and written material and guidebook.  Patient learns that according to the CDC, 50% to 70% of chronic diseases (such as obesity, type 2 diabetes, elevated lipids, hypertension, and heart disease) are avoidable through lifestyle improvements including healthier food choices, listening to satiety cues, and increased physical activity.  Sleep Disorders Clinical staff conducted group or individual video education with verbal and written material and guidebook.  Patient learns how good quality and duration of sleep are important to overall health and well-being. Patient also learns about sleep disorders and how they impact health along with recommendations to address them, including discussing with a physician.  Nutrition  Dining Out - Part 2 Clinical staff conducted group or individual video education with verbal and written material and guidebook.  Patient learns how to plan ahead and communicate in order to maximize their dining experience in a healthy and nutritious manner. Included are recommended food choices  based on the type of restaurant the patient is visiting.   Fueling a Banker conducted group or individual video education with verbal and written material and guidebook.  There is a strong connection between our food choices and our health. Diseases like obesity and type 2 diabetes are very prevalent and are in large-part due to lifestyle choices. The Pritikin Eating Plan provides plenty of food and hunger-curbing satisfaction. It is easy to follow, affordable, and helps reduce health risks.  Menu Workshop  Clinical staff conducted group or individual video education with verbal and written material and guidebook.  Patient learns that restaurant meals can sabotage health goals because they are often packed with calories, fat, sodium, and sugar. Recommendations include strategies to plan ahead and to communicate with the manager, chef, or server to help order a healthier meal.  Planning Your Eating Strategy  Clinical staff conducted group or individual video education with verbal and written material and guidebook.  Patient learns about the Pritikin Eating Plan and its benefit of reducing the risk of disease. The Pritikin Eating Plan does not focus on calories. Instead, it emphasizes high-quality, nutrient-rich foods. By knowing the characteristics of the foods, we choose, we can determine their calorie density and make informed decisions.  Targeting Your Nutrition Priorities  Clinical staff conducted group or individual video education with verbal and written material and guidebook.  Patient learns that lifestyle habits have a tremendous impact on disease risk and progression. This video provides eating and physical activity recommendations based on your personal health goals, such as reducing LDL cholesterol, losing weight, preventing or  controlling type 2 diabetes, and reducing high blood pressure.  Vitamins and Minerals  Clinical staff conducted group or individual video  education with verbal and written material and guidebook.  Patient learns different ways to obtain key vitamins and minerals, including through a recommended healthy diet. It is important to discuss all supplements you take with your doctor.   Healthy Mind-Set    Smoking Cessation  Clinical staff conducted group or individual video education with verbal and written material and guidebook.  Patient learns that cigarette smoking and tobacco addiction pose a serious health risk which affects millions of people. Stopping smoking will significantly reduce the risk of heart disease, lung disease, and many forms of cancer. Recommended strategies for quitting are covered, including working with your doctor to develop a successful plan.  Culinary   Becoming a Set Designer conducted group or individual video education with verbal and written material and guidebook.  Patient learns that cooking at home can be healthy, cost-effective, quick, and puts them in control. Keys to cooking healthy recipes will include looking at your recipe, assessing your equipment needs, planning ahead, making it simple, choosing cost-effective seasonal ingredients, and limiting the use of added fats, salts, and sugars.  Cooking - Breakfast and Snacks  Clinical staff conducted group or individual video education with verbal and written material and guidebook.  Patient learns how important breakfast is to satiety and nutrition through the entire day. Recommendations include key foods to eat during breakfast to help stabilize blood sugar levels and to prevent overeating at meals later in the day. Planning ahead is also a key component.  Cooking - Educational Psychologist conducted group or individual video education with verbal and written material and guidebook.  Patient learns eating strategies to improve overall health, including an approach to cook more at home. Recommendations include thinking of  animal protein as a side on your plate rather than center stage and focusing instead on lower calorie dense options like vegetables, fruits, whole grains, and plant-based proteins, such as beans. Making sauces in large quantities to freeze for later and leaving the skin on your vegetables are also recommended to maximize your experience.  Cooking - Healthy Salads and Dressing Clinical staff conducted group or individual video education with verbal and written material and guidebook.  Patient learns that vegetables, fruits, whole grains, and legumes are the foundations of the Pritikin Eating Plan. Recommendations include how to incorporate each of these in flavorful and healthy salads, and how to create homemade salad dressings. Proper handling of ingredients is also covered. Cooking - Soups and [employer] - Soups and Desserts Clinical staff conducted group or individual video education with verbal and written material and guidebook.  Patient learns that Pritikin soups and desserts make for easy, nutritious, and delicious snacks and meal components that are low in sodium, fat, sugar, and calorie density, while high in vitamins, minerals, and filling fiber. Recommendations include simple and healthy ideas for soups and desserts.   Overview     The Pritikin Solution Program Overview Clinical staff conducted group or individual video education with verbal and written material and guidebook.  Patient learns that the results of the Pritikin Program have been documented in more than 100 articles published in peer-reviewed journals, and the benefits include reducing risk factors for (and, in some cases, even reversing) high cholesterol, high blood pressure, type 2 diabetes, obesity, and more! An overview of the three key pillars of the Pritikin  Program will be covered: eating well, doing regular exercise, and having a healthy mind-set.  WORKSHOPS  Exercise: Exercise Basics: Building Your Action  Plan Clinical staff led group instruction and group discussion with PowerPoint presentation and patient guidebook. To enhance the learning environment the use of posters, models and videos may be added. At the conclusion of this workshop, patients will comprehend the difference between physical activity and exercise, as well as the benefits of incorporating both, into their routine. Patients will understand the FITT (Frequency, Intensity, Time, and Type) principle and how to use it to build an exercise action plan. In addition, safety concerns and other considerations for exercise and cardiac rehab will be addressed by the presenter. The purpose of this lesson is to promote a comprehensive and effective weekly exercise routine in order to improve patients overall level of fitness.   Managing Heart Disease: Your Path to a Healthier Heart Clinical staff led group instruction and group discussion with PowerPoint presentation and patient guidebook. To enhance the learning environment the use of posters, models and videos may be added.At the conclusion of this workshop, patients will understand the anatomy and physiology of the heart. Additionally, they will understand how Pritikins three pillars impact the risk factors, the progression, and the management of heart disease.  The purpose of this lesson is to provide a high-level overview of the heart, heart disease, and how the Pritikin lifestyle positively impacts risk factors.  Exercise Biomechanics Clinical staff led group instruction and group discussion with PowerPoint presentation and patient guidebook. To enhance the learning environment the use of posters, models and videos may be added. Patients will learn how the structural parts of their bodies function and how these functions impact their daily activities, movement, and exercise. Patients will learn how to promote a neutral spine, learn how to manage pain, and identify ways to improve  their physical movement in order to promote healthy living. The purpose of this lesson is to expose patients to common physical limitations that impact physical activity. Participants will learn practical ways to adapt and manage aches and pains, and to minimize their effect on regular exercise. Patients will learn how to maintain good posture while sitting, walking, and lifting.  Balance Training and Fall Prevention  Clinical staff led group instruction and group discussion with PowerPoint presentation and patient guidebook. To enhance the learning environment the use of posters, models and videos may be added. At the conclusion of this workshop, patients will understand the importance of their sensorimotor skills (vision, proprioception, and the vestibular system) in maintaining their ability to balance as they age. Patients will apply a variety of balancing exercises that are appropriate for their current level of function. Patients will understand the common causes for poor balance, possible solutions to these problems, and ways to modify their physical environment in order to minimize their fall risk. The purpose of this lesson is to teach patients about the importance of maintaining balance as they age and ways to minimize their risk of falling.  WORKSHOPS   Nutrition:  Fueling a Ship Broker led group instruction and group discussion with PowerPoint presentation and patient guidebook. To enhance the learning environment the use of posters, models and videos may be added. Patients will review the foundational principles of the Pritikin Eating Plan and understand what constitutes a serving size in each of the food groups. Patients will also learn Pritikin-friendly foods that are better choices when away from home and review make-ahead meal and snack options.  Calorie density will be reviewed and applied to three nutrition priorities: weight maintenance, weight loss, and weight  gain. The purpose of this lesson is to reinforce (in a group setting) the key concepts around what patients are recommended to eat and how to apply these guidelines when away from home by planning and selecting Pritikin-friendly options. Patients will understand how calorie density may be adjusted for different weight management goals.  Mindful Eating  Clinical staff led group instruction and group discussion with PowerPoint presentation and patient guidebook. To enhance the learning environment the use of posters, models and videos may be added. Patients will briefly review the concepts of the Pritikin Eating Plan and the importance of low-calorie dense foods. The concept of mindful eating will be introduced as well as the importance of paying attention to internal hunger signals. Triggers for non-hunger eating and techniques for dealing with triggers will be explored. The purpose of this lesson is to provide patients with the opportunity to review the basic principles of the Pritikin Eating Plan, discuss the value of eating mindfully and how to measure internal cues of hunger and fullness using the Hunger Scale. Patients will also discuss reasons for non-hunger eating and learn strategies to use for controlling emotional eating.  Targeting Your Nutrition Priorities Clinical staff led group instruction and group discussion with PowerPoint presentation and patient guidebook. To enhance the learning environment the use of posters, models and videos may be added. Patients will learn how to determine their genetic susceptibility to disease by reviewing their family history. Patients will gain insight into the importance of diet as part of an overall healthy lifestyle in mitigating the impact of genetics and other environmental insults. The purpose of this lesson is to provide patients with the opportunity to assess their personal nutrition priorities by looking at their family history, their own health history  and current risk factors. Patients will also be able to discuss ways of prioritizing and modifying the Pritikin Eating Plan for their highest risk areas  Menu  Clinical staff led group instruction and group discussion with PowerPoint presentation and patient guidebook. To enhance the learning environment the use of posters, models and videos may be added. Using menus brought in from [employer], or printed from [employer], patients will apply the Pritikin dining out guidelines that were presented in the [employer] video. Patients will also be able to practice these guidelines in a variety of provided scenarios. The purpose of this lesson is to provide patients with the opportunity to practice hands-on learning of the Pritikin Dining Out guidelines with actual menus and practice scenarios.  Label Reading Clinical staff led group instruction and group discussion with PowerPoint presentation and patient guidebook. To enhance the learning environment the use of posters, models and videos may be added. Patients will review and discuss the Pritikin label reading guidelines presented in Pritikins Label Reading Educational series video. Using fool labels brought in from local grocery stores and markets, patients will apply the label reading guidelines and determine if the packaged food meet the Pritikin guidelines. The purpose of this lesson is to provide patients with the opportunity to review, discuss, and practice hands-on learning of the Pritikin Label Reading guidelines with actual packaged food labels. Cooking School  Pritikins [employer] are designed to teach patients ways to prepare quick, simple, and affordable recipes at home. The importance of nutritions role in chronic disease risk reduction is reflected in its emphasis in the overall Pritikin program. By  learning how to prepare essential core Pritikin Eating Plan recipes, patients will increase control over  what they eat; be able to customize the flavor of foods without the use of added salt, sugar, or fat; and improve the quality of the food they consume. By learning a set of core recipes which are easily assembled, quickly prepared, and affordable, patients are more likely to prepare more healthy foods at home. These workshops focus on convenient breakfasts, simple entres, side dishes, and desserts which can be prepared with minimal effort and are consistent with nutrition recommendations for cardiovascular risk reduction. Cooking [employer] are taught by a armed forces logistics/support/administrative officer (RD) who has been trained by the [employer]. The chef or RD has a clear understanding of the importance of minimizing - if not completely eliminating - added fat, sugar, and sodium in recipes. Throughout the series of Cooking School Workshop sessions, patients will learn about healthy ingredients and efficient methods of cooking to build confidence in their capability to prepare    Cooking School weekly topics:  Adding Flavor- Sodium-Free  Fast and Healthy Breakfasts  Powerhouse Plant-Based Proteins  Satisfying Salads and Dressings  Simple Sides and Sauces  International Cuisine-Spotlight on the [employer] Zones  Delicious Desserts  Savory Soups  [employer] - Meals in a Astronomer Appetizers and Snacks  Comforting Weekend Breakfasts  One-Pot Wonders   Fast Evening Meals  Landscape Architect Your Pritikin Plate  WORKSHOPS   Healthy Mindset (Psychosocial):  Focused Goals, Sustainable Changes Clinical staff led group instruction and group discussion with PowerPoint presentation and patient guidebook. To enhance the learning environment the use of posters, models and videos may be added. Patients will be able to apply effective goal setting strategies to establish at least one personal goal, and then take consistent, meaningful action toward that goal. They will learn to  identify common barriers to achieving personal goals and develop strategies to overcome them. Patients will also gain an understanding of how our mind-set can impact our ability to achieve goals and the importance of cultivating a positive and growth-oriented mind-set. The purpose of this lesson is to provide patients with a deeper understanding of how to set and achieve personal goals, as well as the tools and strategies needed to overcome common obstacles which may arise along the way.  From Head to Heart: The Power of a Healthy Outlook  Clinical staff led group instruction and group discussion with PowerPoint presentation and patient guidebook. To enhance the learning environment the use of posters, models and videos may be added. Patients will be able to recognize and describe the impact of emotions and mood on physical health. They will discover the importance of self-care and explore self-care practices which may work for them. Patients will also learn how to utilize the 4 Cs to cultivate a healthier outlook and better manage stress and challenges. The purpose of this lesson is to demonstrate to patients how a healthy outlook is an essential part of maintaining good health, especially as they continue their cardiac rehab journey.  Healthy Sleep for a Healthy Heart Clinical staff led group instruction and group discussion with PowerPoint presentation and patient guidebook. To enhance the learning environment the use of posters, models and videos may be added. At the conclusion of this workshop, patients will be able to demonstrate knowledge of the importance of sleep to overall health, well-being, and quality of life. They will understand the symptoms of, and treatments for, common sleep  disorders. Patients will also be able to identify daytime and nighttime behaviors which impact sleep, and they will be able to apply these tools to help manage sleep-related challenges. The purpose of this lesson is to  provide patients with a general overview of sleep and outline the importance of quality sleep. Patients will learn about a few of the most common sleep disorders. Patients will also be introduced to the concept of sleep hygiene, and discover ways to self-manage certain sleeping problems through simple daily behavior changes. Finally, the workshop will motivate patients by clarifying the links between quality sleep and their goals of heart-healthy living.   Recognizing and Reducing Stress Clinical staff led group instruction and group discussion with PowerPoint presentation and patient guidebook. To enhance the learning environment the use of posters, models and videos may be added. At the conclusion of this workshop, patients will be able to understand the types of stress reactions, differentiate between acute and chronic stress, and recognize the impact that chronic stress has on their health. They will also be able to apply different coping mechanisms, such as reframing negative self-talk. Patients will have the opportunity to practice a variety of stress management techniques, such as deep abdominal breathing, progressive muscle relaxation, and/or guided imagery.  The purpose of this lesson is to educate patients on the role of stress in their lives and to provide healthy techniques for coping with it.  Learning Barriers/Preferences:  Learning Barriers/Preferences - 09/14/24 1255       Learning Barriers/Preferences   Learning Barriers Sight   wears glasses, has back problems   Learning Preferences Audio;Video;Pictoral          Education Topics:  Knowledge Questionnaire Score:  Knowledge Questionnaire Score - 09/14/24 1130       Knowledge Questionnaire Score   Pre Score 19/24          Core Components/Risk Factors/Patient Goals at Admission:  Personal Goals and Risk Factors at Admission - 09/14/24 1130       Core Components/Risk Factors/Patient Goals on Admission    Weight  Management Yes;Obesity;Weight Loss    Intervention Weight Management/Obesity: Establish reasonable short term and long term weight goals.;Obesity: Provide education and appropriate resources to help participant work on and attain dietary goals.    Admit Weight 186 lb 4.8 oz (84.5 kg)    Goal Weight: Short Term 180 lb (81.6 kg)    Goal Weight: Long Term 160 lb (72.6 kg)    Expected Outcomes Short Term: Continue to assess and modify interventions until short term weight is achieved;Long Term: Adherence to nutrition and physical activity/exercise program aimed toward attainment of established weight goal;Weight Loss: Understanding of general recommendations for a balanced deficit meal plan, which promotes 1-2 lb weight loss per week and includes a negative energy balance of [phone] kcal/d    Hypertension Yes    Intervention Provide education on lifestyle modifcations including regular physical activity/exercise, weight management, moderate sodium restriction and increased consumption of fresh fruit, vegetables, and low fat dairy, alcohol moderation, and smoking cessation.;Monitor prescription use compliance.    Expected Outcomes Short Term: Continued assessment and intervention until BP is < 140/67mm HG in hypertensive participants. < 130/34mm HG in hypertensive participants with diabetes, heart failure or chronic kidney disease.;Long Term: Maintenance of blood pressure at goal levels.    Lipids Yes    Intervention Provide education and support for participant on nutrition & aerobic/resistive exercise along with prescribed medications to achieve LDL 70mg , HDL >40mg .    Expected  Outcomes Short Term: Participant states understanding of desired cholesterol values and is compliant with medications prescribed. Participant is following exercise prescription and nutrition guidelines.;Long Term: Cholesterol controlled with medications as prescribed, with individualized exercise RX and with personalized nutrition  plan. Value goals: LDL < 70mg , HDL > 40 mg.    Stress Yes    Intervention Refer participants experiencing significant psychosocial distress to appropriate mental health specialists for further evaluation and treatment. When possible, include family members and significant others in education/counseling sessions.;Offer individual and/or small group education and counseling on adjustment to heart disease, stress management and health-related lifestyle change. Teach and support self-help strategies.    Expected Outcomes Short Term: Participant demonstrates changes in health-related behavior, relaxation and other stress management skills, ability to obtain effective social support, and compliance with psychotropic medications if prescribed.;Long Term: Emotional wellbeing is indicated by absence of clinically significant psychosocial distress or social isolation.          Core Components/Risk Factors/Patient Goals Review:    Core Components/Risk Factors/Patient Goals at Discharge (Final Review):    ITP Comments:  ITP Comments     Row Name 08/12/24 1550 08/19/24 0936 08/25/24 1337 09/07/24 1124 09/14/24 1015   ITP Comments Completed program orientation and . Initial ITP created and sent for review to Medical Director. First full day of exercise!  Patient was oriented to gym and equipment including functions, settings, policies, and procedures.  Patient's individual exercise prescription and treatment plan were reviewed.  All starting workloads were established based on the results of the 6 minute walk test done at initial orientation visit.  The plan for exercise progression was also introduced and progression will be customized based on patient's performance and goals. 30 Day review completed. Medical Director ITP review done, changes made as directed, and signed approval by Medical Director. New to program Program in [place] called to state patient wishes to transfer there due to that location  being closer to her. She will attend the program there. She completed 5 of 36 sessions. Medical Director- Dr. Wilbert Bihari, MD. Introduction to the [facility]. Reviewed initial orientation folder.      Comments: Naureen attended orientation for the cardiac rehabilitation program on  09/14/2024  to perform initial intake and exercise walk test. She was introduced to the [employer] education and orientation packet was reviewed. Completed 6-minute walk test, measurements, initial ITP, and exercise prescription. Vital signs stable. Telemetry-normal sinus rhythm, asymptomatic.   Service time was from 1015 to 1200. Arnoldo CHRISTELLA Gal, MS, ACSM CEP 09/14/2024 1308        [1]  Current Outpatient Medications:    acetaminophen  (TYLENOL ) 500 MG tablet, Take 500 mg by mouth daily as needed for moderate pain (pain score 4-6), mild pain (pain score 1-3) or headache., Disp: , Rfl:    ALPRAZolam  (XANAX ) 0.25 MG tablet, Take 0.25 mg by mouth daily as needed for anxiety., Disp: , Rfl:    aspirin  EC 81 MG tablet, Take 81 mg by mouth daily. Swallow whole., Disp: , Rfl:    buPROPion  (WELLBUTRIN  XL) 300 MG 24 hr tablet, Take 300 mg by mouth daily., Disp: , Rfl: 0   clopidogrel  (PLAVIX ) 75 MG tablet, Take 1 tablet (75 mg total) by mouth daily., Disp: 90 tablet, Rfl: 3   levothyroxine  (SYNTHROID ) 112 MCG tablet, Take 112 mcg by mouth daily before breakfast., Disp: , Rfl:    liothyronine  (CYTOMEL ) 5 MCG tablet, Take 5 mcg by mouth daily., Disp: ,  Rfl:    losartan -hydrochlorothiazide  (HYZAAR) 100-25 MG tablet, Take 1 tablet by mouth daily., Disp: , Rfl:    metoprolol  succinate (TOPROL  XL) 25 MG 24 hr tablet, Take 1 tablet (25 mg total) by mouth daily., Disp: 90 tablet, Rfl: 3   montelukast (SINGULAIR) 10 MG tablet, Take 10 mg by mouth daily as needed (allergies)., Disp: , Rfl:    Multiple Vitamin (MULTIVITAMIN) capsule, Take 1 capsule by mouth daily. One a day 50+, Disp: , Rfl:     nitroGLYCERIN  (NITROSTAT ) 0.4 MG SL tablet, Place 1 tablet (0.4 mg total) under the tongue every 5 (five) minutes as needed for chest pain., Disp: 25 tablet, Rfl: 2   rosuvastatin (CRESTOR) 20 MG tablet, Take 20 mg by mouth daily., Disp: , Rfl:  [2]  Social History Tobacco Use  Smoking Status Former   Current packs/day: 0.00   Average packs/day: 0.3 packs/day for 20.0 years (5.0 ttl pk-yrs)   Types: Cigarettes   Start date: 12/25/1993   Quit date: 12/25/2013   Years since quitting: 10.7  Smokeless Tobacco Never

## 2024-09-16 ENCOUNTER — Encounter

## 2024-09-20 ENCOUNTER — Encounter (HOSPITAL_COMMUNITY)
Admission: RE | Admit: 2024-09-20 | Discharge: 2024-09-20 | Disposition: A | Source: Ambulatory Visit | Attending: Cardiology

## 2024-09-20 DIAGNOSIS — Z48812 Encounter for surgical aftercare following surgery on the circulatory system: Secondary | ICD-10-CM | POA: Diagnosis not present

## 2024-09-20 DIAGNOSIS — Z955 Presence of coronary angioplasty implant and graft: Secondary | ICD-10-CM

## 2024-09-20 NOTE — Progress Notes (Signed)
 Daily Session Note  Patient Details  Name: Doris Davis MRN: [id number] Date of Birth: 13-Jul-1971 Referring Provider:   Flowsheet Row CARDIAC REHAB PHASE II ORIENTATION from 09/14/2024 in [facility]  Referring Provider Doris Davis ORN, MD    Encounter Date: 09/20/2024  Check In:  Session Check In - 09/20/24 1038       Check-In   Supervising physician immediately available to respond to emergencies [facility] MD immediately available    Physician(s) Doris Bane NP    Location [facility]    Staff Present Doris Gal, MS, ACSM-CEP, Exercise Physiologist;Doris Desautel, RN, BSN;Doris Davis BS, ACSM-CEP, Exercise Physiologist;Doris Makemson, MS, ACSM-CEP, CCRP, Exercise Physiologist    Virtual Visit No    Medication changes reported     No    Fall or balance concerns reported    No    Tobacco Cessation No Change    Warm-up and Cool-down Performed as group-led instruction    Resistance Training Performed Yes    VAD Patient? No    PAD/SET Patient? No      Pain Assessment   Currently in Pain? No/denies    Pain Score 0-No pain    Multiple Pain Sites No          Capillary Blood Glucose: No results found for this or any previous visit (from the past 24 hours).    Tobacco Use History[1]  Goals Met:  Exercise tolerated well No report of concerns or symptoms today Strength training completed today  Goals Unmet:  Not Applicable  Comments: Pt started cardiac rehab today.  Pt tolerated light exercise without difficulty. VSS, telemetry-Sinus Rhythm, asymptomatic.  Medication list reconciled. Pt denies barriers to medicaiton compliance.  PSYCHOSOCIAL ASSESSMENT:  PHQ-8. Discussed PHQ9. Doris Davis says that she has OCD and has experienced some depression since her cardiac event. Doris Davis is taking Wellbutrin  for OCD.   Pt enjoys gardening and reading. Doris Davis says she is not interested in counseling at this time.  Doris Davis says that she feels tired all the time. Doris Davis has talked to her PCP regarding this.   Pt oriented to exercise equipment and routine.    Understanding verbalized.Doris Elpidio Quan RN BSN    Dr. Wilbert Bihari is Medical Director for Cardiac Rehab at [facility].     [1]  Social History Tobacco Use  Smoking Status Former   Current packs/day: 0.00   Average packs/day: 0.3 packs/day for 20.0 years (5.0 ttl pk-yrs)   Types: Cigarettes   Start date: 12/25/1993   Quit date: 12/25/2013   Years since quitting: 10.7  Smokeless Tobacco Never

## 2024-09-21 ENCOUNTER — Encounter

## 2024-09-22 ENCOUNTER — Encounter (HOSPITAL_COMMUNITY)
Admission: RE | Admit: 2024-09-22 | Discharge: 2024-09-22 | Disposition: A | Discharge status: Home / Self Care | Source: Ambulatory Visit | Attending: Cardiology | Admitting: Cardiology

## 2024-09-22 DIAGNOSIS — Z48812 Encounter for surgical aftercare following surgery on the circulatory system: Secondary | ICD-10-CM | POA: Diagnosis not present

## 2024-09-22 DIAGNOSIS — Z955 Presence of coronary angioplasty implant and graft: Secondary | ICD-10-CM

## 2024-09-23 ENCOUNTER — Encounter

## 2024-09-24 ENCOUNTER — Telehealth (HOSPITAL_COMMUNITY): Payer: Self-pay

## 2024-09-24 NOTE — Telephone Encounter (Signed)
 Patient left message stating she will not be able to make her 9:15 appt with the nutritionist on 1/19.

## 2024-09-27 ENCOUNTER — Encounter (HOSPITAL_COMMUNITY): Admission: RE | Admit: 2024-09-27 | Source: Ambulatory Visit

## 2024-09-27 ENCOUNTER — Telehealth (HOSPITAL_COMMUNITY): Payer: Self-pay

## 2024-09-27 NOTE — Telephone Encounter (Signed)
 Patient c/o sick for 10:15 CR class, states she has a fever. Informed her she must be 48 hour symptom-free before returning to class.

## 2024-09-28 ENCOUNTER — Encounter

## 2024-09-29 ENCOUNTER — Encounter (HOSPITAL_COMMUNITY)
Admission: RE | Admit: 2024-09-29 | Discharge: 2024-09-29 | Disposition: A | Source: Ambulatory Visit | Attending: Cardiology

## 2024-09-30 ENCOUNTER — Encounter

## 2024-10-04 ENCOUNTER — Encounter (HOSPITAL_COMMUNITY)

## 2024-10-05 ENCOUNTER — Encounter

## 2024-10-05 ENCOUNTER — Telehealth (HOSPITAL_COMMUNITY): Payer: Self-pay | Admitting: *Deleted

## 2024-10-05 ENCOUNTER — Ambulatory Visit: Admitting: Cardiology

## 2024-10-05 NOTE — Telephone Encounter (Signed)
 Spoke with Shaindy she has been absent due to not feeling well and having a fever. Marcellina says she has an appointment with her PCP this afternoon. Will cancel Anala's appointments for the rest of the week. Instructed Kristinia to be symptom free for 48 hours before returning to group exercise. Patient states understanding.[name] Elpidio Quan RN BSN

## 2024-10-05 NOTE — Progress Notes (Signed)
 Cardiac Individual Treatment Plan  Patient Details  Name: Doris Davis MRN: [id number] Date of Birth: [birth year] Referring Provider:   Flowsheet Row CARDIAC REHAB PHASE II ORIENTATION from 09/14/2024 in [facility]  Referring Provider Anner Alm ORN, MD    Initial Encounter Date:  Flowsheet Row CARDIAC REHAB PHASE II ORIENTATION from 09/14/2024 in [facility]  Date 09/14/24    Visit Diagnosis: 07/20/24 DES RCA  Patient's Home Medications on Admission: Current Medications[1]  Past Medical History: Past Medical History:  Diagnosis Date   Allergy    Anemia    Anxiety    Arthritis    Asthma    with contact with cats   BV (bacterial vaginosis) 08/09/2013   Coronary artery disease    Family history of adverse reaction to anesthesia    mother was sick for days   Hyperlipidemia    Hypertension    PONV (postoperative nausea and vomiting)    sts with ablation she was given meds for nausea and got sicker.   Vaginal bleeding 08/09/2013   Had supra cervical hyst 2012, has had bleeding after sex   Venereal disease 1990   chlamydia   Wears glasses     Tobacco Use: Tobacco Use History[2]  Labs: Review Flowsheet       Latest Ref Rng & Units 04/12/2013  Labs for ITP Cardiac and Pulmonary Rehab  TCO2 0 - 100 mmol/L 24     Capillary Blood Glucose: No results found for: GLUCAP   Exercise Target Goals: Exercise Program Goal: Individual exercise prescription set using results from initial 6 min walk test and THRR while considering  patients activity barriers and safety.   Exercise Prescription Goal: Initial exercise prescription builds to 30-45 minutes a day of aerobic activity, 2-3 days per week.  Home exercise guidelines will be given to patient during program as part of exercise prescription that the participant will acknowledge.  Activity Barriers &  Risk Stratification:  Activity Barriers & Cardiac Risk Stratification - 09/14/24 1133       Activity Barriers & Cardiac Risk Stratification   Activity Barriers Back Problems;Other (comment)    Comments Spinal stenosis, chronic back pain, trigger finger- right middle digit.    Cardiac Risk Stratification Moderate          6 Minute Walk:  6 Minute Walk     Row Name 08/12/24 1646 09/14/24 1301       6 Minute Walk   Phase Initial Initial    Distance 1560 feet 1789 feet    Walk Time 6 minutes 6 minutes    # of Rest Breaks 0 0    MPH 2.95 3.39    METS 4.1 4.26    RPE 9 10    Perceived Dyspnea  0 0    VO2 Peak 14.37 14.9    Symptoms No No    Resting HR 75 bpm 56 bpm    Resting BP 122/74 106/78    Resting Oxygen Saturation  96 % 98 %    Exercise Oxygen Saturation  during 6 min walk 99 % 99 %    Max Ex. HR 118 bpm 95 bpm    Max Ex. BP 134/76 128/82    2 Minute Post BP 126/76 112/78       Oxygen Initial Assessment:   Oxygen Re-Evaluation:   Oxygen Discharge (Final Oxygen Re-Evaluation):   Initial Exercise  Prescription:  Initial Exercise Prescription - 09/14/24 1100       Date of Initial Exercise RX and Referring Provider   Date 09/14/24    Referring Provider Anner Alm ORN, MD    Expected Discharge Date 11/03/24      Treadmill   MPH 3.1    Grade 1.5    Minutes 15    METs 4.01      Rower   Level 5    Watts 20    Minutes 15    METs 4.37      Prescription Details   Frequency (times per week) 2    Duration Progress to 30 minutes of continuous aerobic without signs/symptoms of physical distress      Intensity   THRR 40-80% of Max Heartrate 66-132    Ratings of Perceived Exertion 11-13    Perceived Dyspnea 0-4      Progression   Progression Continue to progress workloads to maintain intensity without signs/symptoms of physical distress.      Resistance Training   Training Prescription Yes    Weight 4 lbs    Reps 10-15          Perform  Capillary Blood Glucose checks as needed.  Exercise Prescription Changes:   Exercise Prescription Changes     Row Name 08/12/24 1600 08/25/24 0700 09/20/24 1026 09/22/24 1026       Response to Exercise   Blood Pressure (Admit) 122/74 104/64 118/72 108/68    Blood Pressure (Exercise) 134/76 118/62 128/78 108/70    Blood Pressure (Exit) 126/76 98/68 122/78 108/78    Heart Rate (Admit) 75 bpm 65 bpm 56 bpm 66 bpm    Heart Rate (Exercise) 118 bpm 113 bpm 106 bpm 104 bpm    Heart Rate (Exit) 92 bpm 93 bpm 65 bpm 63 bpm    Oxygen Saturation (Admit) 96 % -- -- --    Oxygen Saturation (Exercise) 99 % -- -- --    Oxygen Saturation (Exit) 99 % -- -- --    Rating of Perceived Exertion (Exercise) 9 12 13 12     Perceived Dyspnea (Exercise) 0 0 -- --    Symptoms none none None None    Comments results first 2 weeks of exercise Off to a good start with exercise. --    Duration -- Progress to 30 minutes of  aerobic without signs/symptoms of physical distress Continue with 30 min of aerobic exercise without signs/symptoms of physical distress. Continue with 30 min of aerobic exercise without signs/symptoms of physical distress.    Intensity -- THRR unchanged THRR unchanged  66-132 THRR unchanged  66-132      Progression   Progression -- Continue to progress workloads to maintain intensity without signs/symptoms of physical distress. Continue to progress workloads to maintain intensity without signs/symptoms of physical distress. Continue to progress workloads to maintain intensity without signs/symptoms of physical distress.    Average METs -- 2.7 4 4       Resistance Training   Training Prescription -- -- Yes No    Weight -- 4lb 4 lbs Relaxation day. No weights.    Reps -- 10-15 10-15 --    Time -- -- 5 Minutes --      Interval Training   Interval Training -- No No No      Treadmill   MPH -- 3 3.1 3.1    Grade -- 0 1.5 1.5    Minutes -- 15 15 15     METs --  3.3 4.01 4.01      T5  Nustep   Level -- 3 -- --    Minutes -- 15 -- --    METs -- 2.1 -- --      Rower   Level -- -- 10 10    Watts -- -- 12 16    Minutes -- -- 15 15    METs -- -- 4.06 4.07      Oxygen   Maintain Oxygen Saturation -- 88% or higher -- --       Exercise Comments:   Exercise Comments     Row Name 08/19/24 9061 09/20/24 1134         Exercise Comments First full day of exercise!  Patient was oriented to gym and equipment including functions, settings, policies, and procedures.  Patient's individual exercise prescription and treatment plan were reviewed.  All starting workloads were established based on the results of the 6 minute walk test done at initial orientation visit.  The plan for exercise progression was also introduced and progression will be customized based on patient's performance and goals. Doris Davis started exercise in the cardiac rehab program at [facility]. Started at workloads from previous cardiac rehab program at [facility] and tolerated well. Oriented to the exercise equipment and stretching routine.         Exercise Goals and Review:   Exercise Goals     Row Name 08/12/24 1650 09/14/24 1133           Exercise Goals   Increase Physical Activity Yes Yes      Intervention Provide advice, education, support and counseling about physical activity/exercise needs.;Develop an individualized exercise prescription for aerobic and resistive training based on initial evaluation findings, risk stratification, comorbidities and participant's personal goals. Provide advice, education, support and counseling about physical activity/exercise needs.;Develop an individualized exercise prescription for aerobic and resistive training based on initial evaluation findings, risk stratification, comorbidities and participant's personal goals.      Expected Outcomes Short Term: Attend rehab on a regular basis to increase amount of physical activity.;Long Term: Add in home exercise to make exercise part of  routine and to increase amount of physical activity.;Long Term: Exercising regularly at least 3-5 days a week. Short Term: Attend rehab on a regular basis to increase amount of physical activity.;Long Term: Add in home exercise to make exercise part of routine and to increase amount of physical activity.;Long Term: Exercising regularly at least 3-5 days a week.      Increase Strength and Stamina Yes Yes      Intervention Provide advice, education, support and counseling about physical activity/exercise needs.;Develop an individualized exercise prescription for aerobic and resistive training based on initial evaluation findings, risk stratification, comorbidities and participant's personal goals. Provide advice, education, support and counseling about physical activity/exercise needs.;Develop an individualized exercise prescription for aerobic and resistive training based on initial evaluation findings, risk stratification, comorbidities and participant's personal goals.      Expected Outcomes Short Term: Increase workloads from initial exercise prescription for resistance, speed, and METs.;Short Term: Perform resistance training exercises routinely during rehab and add in resistance training at home;Long Term: Improve cardiorespiratory fitness, muscular endurance and strength as measured by increased METs and functional capacity ( ) Short Term: Increase workloads from initial exercise prescription for resistance, speed, and METs.;Short Term: Perform resistance training exercises routinely during rehab and add in resistance training at home;Long Term: Improve cardiorespiratory fitness, muscular endurance and strength as measured by increased METs and functional capacity ( )  Able to understand and use rate of perceived exertion (RPE) scale Yes Yes      Intervention Provide education and explanation on how to use RPE scale Provide education and explanation on how to use RPE scale      Expected Outcomes  Short Term: Able to use RPE daily in rehab to express subjective intensity level;Long Term:  Able to use RPE to guide intensity level when exercising independently Short Term: Able to use RPE daily in rehab to express subjective intensity level;Long Term:  Able to use RPE to guide intensity level when exercising independently      Able to understand and use Dyspnea scale Yes Yes      Intervention Provide education and explanation on how to use Dyspnea scale Provide education and explanation on how to use Dyspnea scale      Expected Outcomes Short Term: Able to use Dyspnea scale daily in rehab to express subjective sense of shortness of breath during exertion;Long Term: Able to use Dyspnea scale to guide intensity level when exercising independently Short Term: Able to use Dyspnea scale daily in rehab to express subjective sense of shortness of breath during exertion;Long Term: Able to use Dyspnea scale to guide intensity level when exercising independently      Knowledge and understanding of Target Heart Rate Range (THRR) Yes Yes      Intervention Provide education and explanation of THRR including how the numbers were predicted and where they are located for reference Provide education and explanation of THRR including how the numbers were predicted and where they are located for reference      Expected Outcomes Short Term: Able to state/look up THRR;Long Term: Able to use THRR to govern intensity when exercising independently;Short Term: Able to use daily as guideline for intensity in rehab Short Term: Able to state/look up THRR;Long Term: Able to use THRR to govern intensity when exercising independently;Short Term: Able to use daily as guideline for intensity in rehab      Able to check pulse independently Yes Yes      Intervention Provide education and demonstration on how to check pulse in carotid and radial arteries.;Review the importance of being able to check your own pulse for safety during  independent exercise Provide education and demonstration on how to check pulse in carotid and radial arteries.;Review the importance of being able to check your own pulse for safety during independent exercise      Expected Outcomes Short Term: Able to explain why pulse checking is important during independent exercise;Long Term: Able to check pulse independently and accurately Short Term: Able to explain why pulse checking is important during independent exercise;Long Term: Able to check pulse independently and accurately      Understanding of Exercise Prescription Yes Yes      Intervention Provide education, explanation, and written materials on patient's individual exercise prescription Provide education, explanation, and written materials on patient's individual exercise prescription      Expected Outcomes Short Term: Able to explain program exercise prescription;Long Term: Able to explain home exercise prescription to exercise independently Short Term: Able to explain program exercise prescription;Long Term: Able to explain home exercise prescription to exercise independently         Exercise Goals Re-Evaluation :  Exercise Goals Re-Evaluation     Row Name 08/19/24 9061 08/25/24 0749 09/20/24 1134         Exercise Goal Re-Evaluation   Exercise Goals Review Increase Physical Activity;Able to understand and use rate of  perceived exertion (RPE) scale;Knowledge and understanding of Target Heart Rate Range (THRR);Understanding of Exercise Prescription;Increase Strength and Stamina;Able to understand and use Dyspnea scale;Able to check pulse independently Increase Physical Activity;Increase Strength and Stamina;Understanding of Exercise Prescription Increase Physical Activity;Increase Strength and Stamina;Able to understand and use rate of perceived exertion (RPE) scale     Comments Reviewed RPE and dyspnea scale, THR and program prescription with pt today.  Pt voiced understanding and was given a  copy of goals to take home. Lakesa is off to a good start in the program, and was able to attend her first session during this review period. During her session she was able to use the treadmill at and no incline, and the T6 nustep at level 3. We will continue to monitor her progress in the program. Doris Davis is able to understand and use RPE scale appropriately.     Expected Outcomes Short: Use RPE daily to regulate intensity.  Long: Follow program prescription in THR. Short: Continue to follow exercise prescription. Long: Continue exercise to improve strength and stamina. Progress workloads as tolerated to help achieve personal health and fitness goals.        Discharge Exercise Prescription (Final Exercise Prescription Changes):  Exercise Prescription Changes - 09/22/24 1026       Response to Exercise   Blood Pressure (Admit) 108/68    Blood Pressure (Exercise) 108/70    Blood Pressure (Exit) 108/78    Heart Rate (Admit) 66 bpm    Heart Rate (Exercise) 104 bpm    Heart Rate (Exit) 63 bpm    Rating of Perceived Exertion (Exercise) 12    Symptoms None    Duration Continue with 30 min of aerobic exercise without signs/symptoms of physical distress.    Intensity THRR unchanged   66-132     Progression   Progression Continue to progress workloads to maintain intensity without signs/symptoms of physical distress.    Average METs 4      Resistance Training   Training Prescription No    Weight Relaxation day. No weights.      Interval Training   Interval Training No      Treadmill   MPH 3.1    Grade 1.5    Minutes 15    METs 4.01      Rower   Level 10    Watts 16    Minutes 15    METs 4.07          Nutrition:  Target Goals: Understanding of nutrition guidelines, daily intake of sodium 1500mg , cholesterol 200mg , calories 30% from fat and 7% or less from saturated fats, daily to have 5 or more servings of fruits and vegetables.  Biometrics:  Pre Biometrics - 09/14/24  1015       Pre Biometrics   Waist Circumference 39.5 inches    Hip Circumference 45.5 inches    Waist to Hip Ratio 0.87 %    Triceps Skinfold 29 mm    % Body Fat 41.6 %    Grip Strength 10 kg    Flexibility --   Not performed. Chronic back pain, spinal stenosis.   Single Leg Stand 30 seconds           Nutrition Therapy Plan and Nutrition Goals:  Nutrition Therapy & Goals - 09/20/24 1109       Nutrition Therapy   Diet Heart Healthy      Personal Nutrition Goals   Nutrition Goal Patient to improve diet quality by using  the plate method as a guide for meal planning to include lean protein/plant protein, fruits, vegetables, whole grains, nonfat dairy as part of a well-balanced diet.    Personal Goal #2 Patient to identify strategies for weight loss with goal of 0.5-2 # per week of weight loss.    Comments Pt with medical history significant for hypertension, hyperlipidemia, anxiety, ADHD, OSA, hypothyroidism and recently diagnosed CAD. She underwent cardiac catheterization on 07/20/2024 that showed 75% mid to distal RCA lesion treated with Synergy XD 4.0 x 20 mm DES, 30% distal RCA lesion and a 30% proximal RCA lesion. Reports adopting more plant-based diet. Verbalizes interest in attending cooking classes to better learn how to prepare heart healthy meals. Notes increased caloric intake over the holidays, though working to resume caloric deficit to promote weight loss. Current BMI in Obese Class I category. Patient will benefit from participation in cardiac rehab for nutrition education, exercise, and lifestyle modification.      Intervention Plan   Intervention Prescribe, educate and counsel regarding individualized specific dietary modifications aiming towards targeted core components such as weight, hypertension, lipid management, diabetes, heart failure and other comorbidities.    Expected Outcomes Short Term Goal: Understand basic principles of dietary content, such as calories,  fat, sodium, cholesterol and nutrients.;Long Term Goal: Adherence to prescribed nutrition plan.          Nutrition Assessments:  MEDIFICTS Score Key: >=70 Need to make dietary changes  40-70 Heart Healthy Diet <= 40 Therapeutic Level Cholesterol Diet   Flowsheet Row Cardiac Rehab from 08/12/2024 in [facility]  Picture Your Plate Total Score on Admission 76   Picture Your Plate Scores: <59 Unhealthy dietary pattern with much room for improvement. 41-50 Dietary pattern unlikely to meet recommendations for good health and room for improvement. 51-60 More healthful dietary pattern, with some room for improvement.  >60 Healthy dietary pattern, although there may be some specific behaviors that could be improved.    Nutrition Goals Re-Evaluation:   Nutrition Goals Re-Evaluation:   Nutrition Goals Discharge (Final Nutrition Goals Re-Evaluation):   Psychosocial: Target Goals: Acknowledge presence or absence of significant depression and/or stress, maximize coping skills, provide positive support system. Participant is able to verbalize types and ability to use techniques and skills needed for reducing stress and depression.  Initial Review & Psychosocial Screening:  Initial Psych Review & Screening - 09/14/24 1213       Initial Review   Current issues with History of Depression;Current Depression;Current Psychotropic Meds;Current Stress Concerns    Source of Stress Concerns Chronic Illness    Comments Doris Davis says she has experienced some depression after having her cardiac stent placement. Doris Davis says she has OCD. Doris Davis has seen a counselor in the past. Doris Davis says she does not need counseling currently. Doris Davis is taking wellbutrin  which is controlling her depression. Doris Davis says she feels tired all the time Doris Davis has mentioned this to her cardiologist and her PCP      Family Dynamics   Good Support System? Yes   Kelcy has her husband and 3 children for support.  Jackye also has her father who lives at a assisted living facility     Barriers   Psychosocial barriers to participate in program The patient should benefit from training in stress management and relaxation.      Screening Interventions   Interventions Encouraged to exercise;To provide support and resources with identified psychosocial needs;Provide feedback about the scores to participant    Expected Outcomes  Long Term Goal: Stressors or current issues are controlled or eliminated.;Long Term goal: The participant improves quality of Life and PHQ9 Scores as seen by post scores and/or verbalization of changes;Short Term goal: Identification and review with participant of any Quality of Life or Depression concerns found by scoring the questionnaire.          Quality of Life Scores:  Quality of Life - 09/14/24 1129       Quality of Life   Select Quality of Life      Quality of Life Scores   Health/Function Pre 20.07 %    Socioeconomic Pre 24.43 %    Psych/Spiritual Pre 22.71 %    Family Pre 26.4 %    GLOBAL Pre 22.44 %         Scores of 19 and below usually indicate a poorer quality of life in these areas.  A difference of  2-3 points is a clinically meaningful difference.  A difference of 2-3 points in the total score of the Quality of Life Index has been associated with significant improvement in overall quality of life, self-image, physical symptoms, and general health in studies assessing change in quality of life.  PHQ-9: Review Flowsheet       09/14/2024 08/12/2024 07/30/2024  Depression screen PHQ 2/9  Decreased Interest 1 1 0  Down, Depressed, Hopeless 1 2 0  PHQ - 2 Score 2 3 0  Altered sleeping 1 3 -  Tired, decreased energy 3 3 -  Change in appetite 0 0 -  Feeling bad or failure about yourself  2 1 -  Trouble concentrating 0 2 -  Moving slowly or fidgety/restless 0 0 -  Suicidal thoughts 0 0 -  PHQ-9 Score 8 12 -  Difficult doing work/chores Somewhat difficult  Somewhat difficult -   Interpretation of Total Score  Total Score Depression Severity:  1-4 = Minimal depression, 5-9 = Mild depression, 10-14 = Moderate depression, 15-19 = Moderately severe depression, 20-27 = Severe depression   Psychosocial Evaluation and Intervention:  Psychosocial Evaluation - 08/12/24 1557       Psychosocial Evaluation & Interventions   Interventions Stress management education;Relaxation education;Encouraged to exercise with the program and follow exercise prescription    Comments Doris Davis is excited to start the program as she had an unexpect cath with stent placement 3 weeks ago. She works full time as an airline pilot and per her doctor, must succesfully complete 3 weeks of rehab prior to starting back at work. Doris Davis lives with her husband and one child and states that she also has 2 children that live outside of the home and that her entire family has been very supportive during this event. Doris Davis does have a history of depression and is currently on medication for same. She states that she enjoys reading, hiking and gardening to decompress. She does admit that her depression is worse this time of year and given the fact that she has decreased energy level following her cardiac event.    Expected Outcomes Short: Attend cardiac rehab for education and exercise. Long: Develop and maintain positive self care habits.    Continue Psychosocial Services  Follow up required by staff          Psychosocial Re-Evaluation:  Psychosocial Re-Evaluation     Row Name 09/22/24 0920 10/05/24 1136           Psychosocial Re-Evaluation   Current issues with Current Depression;Current Psychotropic Meds;Current Stress Concerns;Current Sleep Concerns Current Depression;Current Psychotropic Meds;Current  Stress Concerns;Current Sleep Concerns      Comments Discussed PHQ9 at orientation. Doris Davis did not have any increased concerns or stressors during exercise. Doris Davis father is at a assisted  living facility. Doris Davis has good family support. Doris Davis is not interested in counseling at this time. Doris Davis says she will let us  know if she changes her mind. last day of attendance was on 09/22/24. unable to reassess.      Expected Outcomes Evelen will have controlled or decreased depression upon completion of cardiac rehab. Magie will have controlled or decreased depression upon completion of cardiac rehab.      Interventions Stress management education;Encouraged to attend Cardiac Rehabilitation for the exercise;Relaxation education Stress management education;Encouraged to attend Cardiac Rehabilitation for the exercise;Relaxation education      Continue Psychosocial Services  Follow up required by staff Follow up required by staff        Initial Review   Source of Stress Concerns Chronic Illness;Family Chronic Illness;Family      Comments Will continue to monitor and offer support as needed. Will continue to monitor and offer support as needed.         Psychosocial Discharge (Final Psychosocial Re-Evaluation):  Psychosocial Re-Evaluation - 10/05/24 1136       Psychosocial Re-Evaluation   Current issues with Current Depression;Current Psychotropic Meds;Current Stress Concerns;Current Sleep Concerns    Comments last day of attendance was on 09/22/24. unable to reassess.    Expected Outcomes Doris Davis will have controlled or decreased depression upon completion of cardiac rehab.    Interventions Stress management education;Encouraged to attend Cardiac Rehabilitation for the exercise;Relaxation education    Continue Psychosocial Services  Follow up required by staff      Initial Review   Source of Stress Concerns Chronic Illness;Family    Comments Will continue to monitor and offer support as needed.          Vocational Rehabilitation: Provide vocational rehab assistance to qualifying candidates.   Vocational Rehab Evaluation & Intervention:  Vocational Rehab - 09/14/24 1256        Initial Vocational Rehab Evaluation & Intervention   Assessment shows need for Vocational Rehabilitation No   Doris Davis works full time and does not need vocational rehab at this time         Education: Education Goals: Education classes will be provided on a weekly basis, covering required topics. Participant will state understanding/return demonstration of topics presented.    Education     Row Name 09/22/24 1100     Education   Cardiac Education Topics Pritikin   Customer Service Manager   Weekly Topic Adding Flavor - Sodium-Free   Instruction Review Code 1- Verbalizes Understanding   Class Start Time 1146   Class Stop Time 1220   Class Time Calculation (min) 34 min      Core Videos: Exercise    Move It!  Clinical staff conducted group or individual video education with verbal and written material and guidebook.  Patient learns the recommended Pritikin exercise program. Exercise with the goal of living a long, healthy life. Some of the health benefits of exercise include controlled diabetes, healthier blood pressure levels, improved cholesterol levels, improved heart and lung capacity, improved sleep, and better body composition. Everyone should speak with their doctor before starting or changing an exercise routine.  Biomechanical Limitations Clinical staff conducted group or individual video education with verbal and written material and guidebook.  Patient learns how  biomechanical limitations can impact exercise and how we can mitigate and possibly overcome limitations to have an impactful and balanced exercise routine.  Body Composition Clinical staff conducted group or individual video education with verbal and written material and guidebook.  Patient learns that body composition (ratio of muscle mass to fat mass) is a key component to assessing overall fitness, rather than body weight alone. Increased fat mass, especially visceral  belly fat, can put us  at increased risk for metabolic syndrome, type 2 diabetes, heart disease, and even death. It is recommended to combine diet and exercise (cardiovascular and resistance training) to improve your body composition. Seek guidance from your physician and exercise physiologist before implementing an exercise routine.  Exercise Action Plan Clinical staff conducted group or individual video education with verbal and written material and guidebook.  Patient learns the recommended strategies to achieve and enjoy long-term exercise adherence, including variety, self-motivation, self-efficacy, and positive decision making. Benefits of exercise include fitness, good health, weight management, more energy, better sleep, less stress, and overall well-being.  Medical   Heart Disease Risk Reduction Clinical staff conducted group or individual video education with verbal and written material and guidebook.  Patient learns our heart is our most vital organ as it circulates oxygen, nutrients, white blood cells, and hormones throughout the entire body, and carries waste away. Data supports a plant-based eating plan like the Pritikin Program for its effectiveness in slowing progression of and reversing heart disease. The video provides a number of recommendations to address heart disease.   Metabolic Syndrome and Belly Fat  Clinical staff conducted group or individual video education with verbal and written material and guidebook.  Patient learns what metabolic syndrome is, how it leads to heart disease, and how one can reverse it and keep it from coming back. You have metabolic syndrome if you have 3 of the following 5 criteria: abdominal obesity, high blood pressure, high triglycerides, low HDL cholesterol, and high blood sugar.  Hypertension and Heart Disease Clinical staff conducted group or individual video education with verbal and written material and guidebook.  Patient learns that high  blood pressure, or hypertension, is very common in the [place] . Hypertension is largely due to excessive salt intake, but other important risk factors include being overweight, physical inactivity, drinking too much alcohol, smoking, and not eating enough potassium from fruits and vegetables. High blood pressure is a leading risk factor for heart attack, stroke, congestive heart failure, dementia, kidney failure, and premature death. Long-term effects of excessive salt intake include stiffening of the arteries and thickening of heart muscle and organ damage. Recommendations include ways to reduce hypertension and the risk of heart disease.  Diseases of Our Time - Focusing on Diabetes Clinical staff conducted group or individual video education with verbal and written material and guidebook.  Patient learns why the best way to stop diseases of our time is prevention, through food and other lifestyle changes. Medicine (such as prescription pills and surgeries) is often only a Band-Aid on the problem, not a long-term solution. Most common diseases of our time include obesity, type 2 diabetes, hypertension, heart disease, and cancer. The Pritikin Program is recommended and has been proven to help reduce, reverse, and/or prevent the damaging effects of metabolic syndrome.  Nutrition   Overview of the Pritikin Eating Plan  Clinical staff conducted group or individual video education with verbal and written material and guidebook.  Patient learns about the Pritikin Eating Plan for disease risk reduction. The Pritikin Eating  Plan emphasizes a wide variety of unrefined, minimally-processed carbohydrates, like fruits, vegetables, whole grains, and legumes. Go, Caution, and Stop food choices are explained. Plant-based and lean animal proteins are emphasized. Rationale provided for low sodium intake for blood pressure control, low added sugars for blood sugar stabilization, and low added fats and oils for  coronary artery disease risk reduction and weight management.  Calorie Density  Clinical staff conducted group or individual video education with verbal and written material and guidebook.  Patient learns about calorie density and how it impacts the Pritikin Eating Plan. Knowing the characteristics of the food you choose will help you decide whether those foods will lead to weight gain or weight loss, and whether you want to consume more or less of them. Weight loss is usually a side effect of the Pritikin Eating Plan because of its focus on low calorie-dense foods.  Label Reading  Clinical staff conducted group or individual video education with verbal and written material and guidebook.  Patient learns about the Pritikin recommended label reading guidelines and corresponding recommendations regarding calorie density, added sugars, sodium content, and whole grains.  Dining Out - Part 1  Clinical staff conducted group or individual video education with verbal and written material and guidebook.  Patient learns that restaurant meals can be sabotaging because they can be so high in calories, fat, sodium, and/or sugar. Patient learns recommended strategies on how to positively address this and avoid unhealthy pitfalls.  Facts on Fats  Clinical staff conducted group or individual video education with verbal and written material and guidebook.  Patient learns that lifestyle modifications can be just as effective, if not more so, as many medications for lowering your risk of heart disease. A Pritikin lifestyle can help to reduce your risk of inflammation and atherosclerosis (cholesterol build-up, or plaque, in the artery walls). Lifestyle interventions such as dietary choices and physical activity address the cause of atherosclerosis. A review of the types of fats and their impact on blood cholesterol levels, along with dietary recommendations to reduce fat intake is also included.  Nutrition Action Plan   Clinical staff conducted group or individual video education with verbal and written material and guidebook.  Patient learns how to incorporate Pritikin recommendations into their lifestyle. Recommendations include planning and keeping personal health goals in mind as an important part of their success.  Healthy Mind-Set    Healthy Minds, Bodies, Hearts  Clinical staff conducted group or individual video education with verbal and written material and guidebook.  Patient learns how to identify when they are stressed. Video will discuss the impact of that stress, as well as the many benefits of stress management. Patient will also be introduced to stress management techniques. The way we think, act, and feel has an impact on our hearts.  How Our Thoughts Can Heal Our Hearts  Clinical staff conducted group or individual video education with verbal and written material and guidebook.  Patient learns that negative thoughts can cause depression and anxiety. This can result in negative lifestyle behavior and serious health problems. Cognitive behavioral therapy is an effective method to help control our thoughts in order to change and improve our emotional outlook.  Additional Videos:  Exercise    Improving Performance  Clinical staff conducted group or individual video education with verbal and written material and guidebook.  Patient learns to use a non-linear approach by alternating intensity levels and lengths of time spent exercising to help burn more calories and lose more body fat.  Cardiovascular exercise helps improve heart health, metabolism, hormonal balance, blood sugar control, and recovery from fatigue. Resistance training improves strength, endurance, balance, coordination, reaction time, metabolism, and muscle mass. Flexibility exercise improves circulation, posture, and balance. Seek guidance from your physician and exercise physiologist before implementing an exercise routine and learn  your capabilities and proper form for all exercise.  Introduction to Yoga  Clinical staff conducted group or individual video education with verbal and written material and guidebook.  Patient learns about yoga, a discipline of the coming together of mind, breath, and body. The benefits of yoga include improved flexibility, improved range of motion, better posture and core strength, increased lung function, weight loss, and positive self-image. Yogas heart health benefits include lowered blood pressure, healthier heart rate, decreased cholesterol and triglyceride levels, improved immune function, and reduced stress. Seek guidance from your physician and exercise physiologist before implementing an exercise routine and learn your capabilities and proper form for all exercise.  Medical   Aging: Enhancing Your Quality of Life  Clinical staff conducted group or individual video education with verbal and written material and guidebook.  Patient learns key strategies and recommendations to stay in good physical health and enhance quality of life, such as prevention strategies, having an advocate, securing a Health Care Proxy and Power of Attorney, and keeping a list of medications and system for tracking them. It also discusses how to avoid risk for bone loss.  Biology of Weight Control  Clinical staff conducted group or individual video education with verbal and written material and guidebook.  Patient learns that weight gain occurs because we consume more calories than we burn (eating more, moving less). Even if your body weight is normal, you may have higher ratios of fat compared to muscle mass. Too much body fat puts you at increased risk for cardiovascular disease, heart attack, stroke, type 2 diabetes, and obesity-related cancers. In addition to exercise, following the Pritikin Eating Plan can help reduce your risk.  Decoding Lab Results  Clinical staff conducted group or individual video education  with verbal and written material and guidebook.  Patient learns that lab test reflects one measurement whose values change over time and are influenced by many factors, including medication, stress, sleep, exercise, food, hydration, pre-existing medical conditions, and more. It is recommended to use the knowledge from this video to become more involved with your lab results and evaluate your numbers to speak with your doctor.   Diseases of Our Time - Overview  Clinical staff conducted group or individual video education with verbal and written material and guidebook.  Patient learns that according to the CDC, 50% to 70% of chronic diseases (such as obesity, type 2 diabetes, elevated lipids, hypertension, and heart disease) are avoidable through lifestyle improvements including healthier food choices, listening to satiety cues, and increased physical activity.  Sleep Disorders Clinical staff conducted group or individual video education with verbal and written material and guidebook.  Patient learns how good quality and duration of sleep are important to overall health and well-being. Patient also learns about sleep disorders and how they impact health along with recommendations to address them, including discussing with a physician.  Nutrition  Dining Out - Part 2 Clinical staff conducted group or individual video education with verbal and written material and guidebook.  Patient learns how to plan ahead and communicate in order to maximize their dining experience in a healthy and nutritious manner. Included are recommended food choices based on the type of restaurant the  patient is visiting.   Fueling a Banker conducted group or individual video education with verbal and written material and guidebook.  There is a strong connection between our food choices and our health. Diseases like obesity and type 2 diabetes are very prevalent and are in large-part due to lifestyle  choices. The Pritikin Eating Plan provides plenty of food and hunger-curbing satisfaction. It is easy to follow, affordable, and helps reduce health risks.  Menu Workshop  Clinical staff conducted group or individual video education with verbal and written material and guidebook.  Patient learns that restaurant meals can sabotage health goals because they are often packed with calories, fat, sodium, and sugar. Recommendations include strategies to plan ahead and to communicate with the manager, chef, or server to help order a healthier meal.  Planning Your Eating Strategy  Clinical staff conducted group or individual video education with verbal and written material and guidebook.  Patient learns about the Pritikin Eating Plan and its benefit of reducing the risk of disease. The Pritikin Eating Plan does not focus on calories. Instead, it emphasizes high-quality, nutrient-rich foods. By knowing the characteristics of the foods, we choose, we can determine their calorie density and make informed decisions.  Targeting Your Nutrition Priorities  Clinical staff conducted group or individual video education with verbal and written material and guidebook.  Patient learns that lifestyle habits have a tremendous impact on disease risk and progression. This video provides eating and physical activity recommendations based on your personal health goals, such as reducing LDL cholesterol, losing weight, preventing or controlling type 2 diabetes, and reducing high blood pressure.  Vitamins and Minerals  Clinical staff conducted group or individual video education with verbal and written material and guidebook.  Patient learns different ways to obtain key vitamins and minerals, including through a recommended healthy diet. It is important to discuss all supplements you take with your doctor.   Healthy Mind-Set    Smoking Cessation  Clinical staff conducted group or individual video education with verbal and  written material and guidebook.  Patient learns that cigarette smoking and tobacco addiction pose a serious health risk which affects millions of people. Stopping smoking will significantly reduce the risk of heart disease, lung disease, and many forms of cancer. Recommended strategies for quitting are covered, including working with your doctor to develop a successful plan.  Culinary   Becoming a Set Designer conducted group or individual video education with verbal and written material and guidebook.  Patient learns that cooking at home can be healthy, cost-effective, quick, and puts them in control. Keys to cooking healthy recipes will include looking at your recipe, assessing your equipment needs, planning ahead, making it simple, choosing cost-effective seasonal ingredients, and limiting the use of added fats, salts, and sugars.  Cooking - Breakfast and Snacks  Clinical staff conducted group or individual video education with verbal and written material and guidebook.  Patient learns how important breakfast is to satiety and nutrition through the entire day. Recommendations include key foods to eat during breakfast to help stabilize blood sugar levels and to prevent overeating at meals later in the day. Planning ahead is also a key component.  Cooking - Educational Psychologist conducted group or individual video education with verbal and written material and guidebook.  Patient learns eating strategies to improve overall health, including an approach to cook more at home. Recommendations include thinking of animal protein as a side on your  plate rather than center stage and focusing instead on lower calorie dense options like vegetables, fruits, whole grains, and plant-based proteins, such as beans. Making sauces in large quantities to freeze for later and leaving the skin on your vegetables are also recommended to maximize your experience.  Cooking - Healthy Salads and  Dressing Clinical staff conducted group or individual video education with verbal and written material and guidebook.  Patient learns that vegetables, fruits, whole grains, and legumes are the foundations of the Pritikin Eating Plan. Recommendations include how to incorporate each of these in flavorful and healthy salads, and how to create homemade salad dressings. Proper handling of ingredients is also covered. Cooking - Soups and [employer] - Soups and Desserts Clinical staff conducted group or individual video education with verbal and written material and guidebook.  Patient learns that Pritikin soups and desserts make for easy, nutritious, and delicious snacks and meal components that are low in sodium, fat, sugar, and calorie density, while high in vitamins, minerals, and filling fiber. Recommendations include simple and healthy ideas for soups and desserts.   Overview     The Pritikin Solution Program Overview Clinical staff conducted group or individual video education with verbal and written material and guidebook.  Patient learns that the results of the Pritikin Program have been documented in more than 100 articles published in peer-reviewed journals, and the benefits include reducing risk factors for (and, in some cases, even reversing) high cholesterol, high blood pressure, type 2 diabetes, obesity, and more! An overview of the three key pillars of the Pritikin Program will be covered: eating well, doing regular exercise, and having a healthy mind-set.  WORKSHOPS  Exercise: Exercise Basics: Building Your Action Plan Clinical staff led group instruction and group discussion with PowerPoint presentation and patient guidebook. To enhance the learning environment the use of posters, models and videos may be added. At the conclusion of this workshop, patients will comprehend the difference between physical activity and exercise, as well as the benefits of incorporating both, into  their routine. Patients will understand the FITT (Frequency, Intensity, Time, and Type) principle and how to use it to build an exercise action plan. In addition, safety concerns and other considerations for exercise and cardiac rehab will be addressed by the presenter. The purpose of this lesson is to promote a comprehensive and effective weekly exercise routine in order to improve patients overall level of fitness.   Managing Heart Disease: Your Path to a Healthier Heart Clinical staff led group instruction and group discussion with PowerPoint presentation and patient guidebook. To enhance the learning environment the use of posters, models and videos may be added.At the conclusion of this workshop, patients will understand the anatomy and physiology of the heart. Additionally, they will understand how Pritikins three pillars impact the risk factors, the progression, and the management of heart disease.  The purpose of this lesson is to provide a high-level overview of the heart, heart disease, and how the Pritikin lifestyle positively impacts risk factors.  Exercise Biomechanics Clinical staff led group instruction and group discussion with PowerPoint presentation and patient guidebook. To enhance the learning environment the use of posters, models and videos may be added. Patients will learn how the structural parts of their bodies function and how these functions impact their daily activities, movement, and exercise. Patients will learn how to promote a neutral spine, learn how to manage pain, and identify ways to improve their physical movement in order to promote healthy living. The  purpose of this lesson is to expose patients to common physical limitations that impact physical activity. Participants will learn practical ways to adapt and manage aches and pains, and to minimize their effect on regular exercise. Patients will learn how to maintain good posture while sitting, walking, and  lifting.  Balance Training and Fall Prevention  Clinical staff led group instruction and group discussion with PowerPoint presentation and patient guidebook. To enhance the learning environment the use of posters, models and videos may be added. At the conclusion of this workshop, patients will understand the importance of their sensorimotor skills (vision, proprioception, and the vestibular system) in maintaining their ability to balance as they age. Patients will apply a variety of balancing exercises that are appropriate for their current level of function. Patients will understand the common causes for poor balance, possible solutions to these problems, and ways to modify their physical environment in order to minimize their fall risk. The purpose of this lesson is to teach patients about the importance of maintaining balance as they age and ways to minimize their risk of falling.  WORKSHOPS   Nutrition:  Fueling a Ship Broker led group instruction and group discussion with PowerPoint presentation and patient guidebook. To enhance the learning environment the use of posters, models and videos may be added. Patients will review the foundational principles of the Pritikin Eating Plan and understand what constitutes a serving size in each of the food groups. Patients will also learn Pritikin-friendly foods that are better choices when away from home and review make-ahead meal and snack options. Calorie density will be reviewed and applied to three nutrition priorities: weight maintenance, weight loss, and weight gain. The purpose of this lesson is to reinforce (in a group setting) the key concepts around what patients are recommended to eat and how to apply these guidelines when away from home by planning and selecting Pritikin-friendly options. Patients will understand how calorie density may be adjusted for different weight management goals.  Mindful Eating  Clinical staff led  group instruction and group discussion with PowerPoint presentation and patient guidebook. To enhance the learning environment the use of posters, models and videos may be added. Patients will briefly review the concepts of the [facility] Eating Plan and the importance of low-calorie dense foods. The concept of mindful eating will be introduced as well as the importance of paying attention to internal hunger signals. Triggers for non-hunger eating and techniques for dealing with triggers will be explored. The purpose of this lesson is to provide patients with the opportunity to review the basic principles of the [facility]in Eating Plan, discuss the value of eating mindfully and how to measure internal cues of hunger and fullness using the Hunger Scale. Patients will also discuss reasons for non-hunger eating and learn strategies to use for controlling emotional eating.  Targeting Your Nutrition Priorities Clinical staff led group instruction and group discussion with PowerPoint presentation and patient guidebook. To enhance the learning environment the use of posters, models and videos may be added. Patients will learn how to determine their genetic susceptibility to disease by reviewing their family history. Patients will gain insight into the importance of diet as part of an overall healthy lifestyle in mitigating the impact of genetics and other environmental insults. The purpose of this lesson is to provide patients with the opportunity to assess their personal nutrition priorities by looking at their family history, their own health history and current risk factors. Patients will also be able to discuss  ways of prioritizing and modifying the [facility]in Eating Plan for their highest risk areas  Menu  Clinical staff led group instruction and group discussion with PowerPoint presentation and patient guidebook. To enhance the learning environment the use of posters, models and videos may be added. Using menus  brought in from [employer], or printed from [employer], patients will apply the Pritikin dining out guidelines that were presented in the [employer] video. Patients will also be able to practice these guidelines in a variety of provided scenarios. The purpose of this lesson is to provide patients with the opportunity to practice hands-on learning of the Pritikin Dining Out guidelines with actual menus and practice scenarios.  Label Reading Clinical staff led group instruction and group discussion with PowerPoint presentation and patient guidebook. To enhance the learning environment the use of posters, models and videos may be added. Patients will review and discuss the [facility]in label reading guidelines presented in [facility] Label Reading Educational series video. Using fool labels brought in from local grocery stores and markets, patients will apply the label reading guidelines and determine if the packaged food meet the Pritikin guidelines. The purpose of this lesson is to provide patients with the opportunity to review, discuss, and practice hands-on learning of the [facility]rit[facility]in Label Reading guidelines with actual packaged food labels. Cooking School  [facility] [employer] are designed to teach patients ways to prepare quick, simple, and affordable recipes at home. The importance of nutritions role in chronic disease risk reduction is reflected in its emphasis in the overall [facility]in program. By learning how to prepare essential core P[facility]in Eating Plan recipes, patients will increase control over what they eat; be able to customize the flavor of foods without the use of added salt, sugar, or fat; and improve the quality of the food they consume. By learning a set of core recipes which are easily assembled, quickly prepared, and affordable, patients are more likely to prepare more healthy foods at home. These workshops focus on convenient breakfasts, simple  entres, side dishes, and desserts which can be prepared with minimal effort and are consistent with nutrition recommendations for cardiovascular risk reduction. Cooking [employer] are taught by a armed forces logistics/support/administrative officer (RD) who has been trained by the [employer]. The chef or RD has a clear understanding of the importance of minimizing - if not completely eliminating - added fat, sugar, and sodium in recipes. Throughout the series of Cooking School Workshop sessions, patients will learn about healthy ingredients and efficient methods of cooking to build confidence in their capability to prepare    Cooking School weekly topics:  Adding Flavor- Sodium-Free  Fast and Healthy Breakfasts  Powerhouse Plant-Based Proteins  Satisfying Salads and Dressings  Simple Sides and Sauces  International Cuisine-Spotlight on the [employer] Zones  Delicious Desserts  Savory Soups  [employer] - Meals in a Astronomer Appetizers and Snacks  Comforting Weekend Breakfasts  One-Pot Wonders   Fast Evening Meals  Landscape Architect Your Pritikin Plate  WORKSHOPS   Healthy Mindset (Psychosocial):  Focused Goals, Sustainable Changes Clinical staff led group instruction and group discussion with PowerPoint presentation and patient guidebook. To enhance the learning environment the use of posters, models and videos may be added. Patients will be able to apply effective goal setting strategies to establish at least one personal goal, and then take consistent, meaningful action toward that goal. They will learn to identify common barriers to achieving personal goals  and develop strategies to overcome them. Patients will also gain an understanding of how our mind-set can impact our ability to achieve goals and the importance of cultivating a positive and growth-oriented mind-set. The purpose of this lesson is to provide patients with a deeper understanding of how to set and  achieve personal goals, as well as the tools and strategies needed to overcome common obstacles which may arise along the way.  From Head to Heart: The Power of a Healthy Outlook  Clinical staff led group instruction and group discussion with PowerPoint presentation and patient guidebook. To enhance the learning environment the use of posters, models and videos may be added. Patients will be able to recognize and describe the impact of emotions and mood on physical health. They will discover the importance of self-care and explore self-care practices which may work for them. Patients will also learn how to utilize the 4 Cs to cultivate a healthier outlook and better manage stress and challenges. The purpose of this lesson is to demonstrate to patients how a healthy outlook is an essential part of maintaining good health, especially as they continue their cardiac rehab journey.  Healthy Sleep for a Healthy Heart Clinical staff led group instruction and group discussion with PowerPoint presentation and patient guidebook. To enhance the learning environment the use of posters, models and videos may be added. At the conclusion of this workshop, patients will be able to demonstrate knowledge of the importance of sleep to overall health, well-being, and quality of life. They will understand the symptoms of, and treatments for, common sleep disorders. Patients will also be able to identify daytime and nighttime behaviors which impact sleep, and they will be able to apply these tools to help manage sleep-related challenges. The purpose of this lesson is to provide patients with a general overview of sleep and outline the importance of quality sleep. Patients will learn about a few of the most common sleep disorders. Patients will also be introduced to the concept of sleep hygiene, and discover ways to self-manage certain sleeping problems through simple daily behavior changes. Finally, the workshop will motivate  patients by clarifying the links between quality sleep and their goals of heart-healthy living.   Recognizing and Reducing Stress Clinical staff led group instruction and group discussion with PowerPoint presentation and patient guidebook. To enhance the learning environment the use of posters, models and videos may be added. At the conclusion of this workshop, patients will be able to understand the types of stress reactions, differentiate between acute and chronic stress, and recognize the impact that chronic stress has on their health. They will also be able to apply different coping mechanisms, such as reframing negative self-talk. Patients will have the opportunity to practice a variety of stress management techniques, such as deep abdominal breathing, progressive muscle relaxation, and/or guided imagery.  The purpose of this lesson is to educate patients on the role of stress in their lives and to provide healthy techniques for coping with it.  Learning Barriers/Preferences:  Learning Barriers/Preferences - 09/14/24 1255       Learning Barriers/Preferences   Learning Barriers Sight   wears glasses, has back problems   Learning Preferences Audio;Video;Pictoral          Education Topics:  Knowledge Questionnaire Score:  Knowledge Questionnaire Score - 09/14/24 1130       Knowledge Questionnaire Score   Pre Score 19/24          Core Components/Risk Factors/Patient Goals at Admission:  Personal  Goals and Risk Factors at Admission - 09/14/24 1130       Core Components/Risk Factors/Patient Goals on Admission    Weight Management Yes;Obesity;Weight Loss    Intervention Weight Management/Obesity: Establish reasonable short term and long term weight goals.;Obesity: Provide education and appropriate resources to help participant work on and attain dietary goals.    Admit Weight 186 lb 4.8 oz (84.5 kg)    Goal Weight: Short Term 180 lb (81.6 kg)    Goal Weight: Long Term 160 lb  (72.6 kg)    Expected Outcomes Short Term: Continue to assess and modify interventions until short term weight is achieved;Long Term: Adherence to nutrition and physical activity/exercise program aimed toward attainment of established weight goal;Weight Loss: Understanding of general recommendations for a balanced deficit meal plan, which promotes 1-2 lb weight loss per week and includes a negative energy balance of [phone] kcal/d    Hypertension Yes    Intervention Provide education on lifestyle modifcations including regular physical activity/exercise, weight management, moderate sodium restriction and increased consumption of fresh fruit, vegetables, and low fat dairy, alcohol moderation, and smoking cessation.;Monitor prescription use compliance.    Expected Outcomes Short Term: Continued assessment and intervention until BP is < 140/77mm HG in hypertensive participants. < 130/33mm HG in hypertensive participants with diabetes, heart failure or chronic kidney disease.;Long Term: Maintenance of blood pressure at goal levels.    Lipids Yes    Intervention Provide education and support for participant on nutrition & aerobic/resistive exercise along with prescribed medications to achieve LDL 70mg , HDL >40mg .    Expected Outcomes Short Term: Participant states understanding of desired cholesterol values and is compliant with medications prescribed. Participant is following exercise prescription and nutrition guidelines.;Long Term: Cholesterol controlled with medications as prescribed, with individualized exercise RX and with personalized nutrition plan. Value goals: LDL < 70mg , HDL > 40 mg.    Stress Yes    Intervention Refer participants experiencing significant psychosocial distress to appropriate mental health specialists for further evaluation and treatment. When possible, include family members and significant others in education/counseling sessions.;Offer individual and/or small group education and  counseling on adjustment to heart disease, stress management and health-related lifestyle change. Teach and support self-help strategies.    Expected Outcomes Short Term: Participant demonstrates changes in health-related behavior, relaxation and other stress management skills, ability to obtain effective social support, and compliance with psychotropic medications if prescribed.;Long Term: Emotional wellbeing is indicated by absence of clinically significant psychosocial distress or social isolation.          Core Components/Risk Factors/Patient Goals Review:   Goals and Risk Factor Review     Row Name 09/22/24 0926 10/05/24 1136           Core Components/Risk Factors/Patient Goals Review   Personal Goals Review Weight Management/Obesity;Hypertension;Lipids;Stress Weight Management/Obesity;Hypertension;Lipids;Stress      Review Doris Davis started cardiac rehab on 09/20/24. Doris Davis did well with exercise. Vital signs were stable. Doris Davis started cardiac rehab on 09/20/24. Doris Davis did well with exercise. Vital signs were stable. Doris Davis has been absent due to not feeling well.      Expected Outcomes Verlia will continue to exercise, follow nutrition and lifestyle modifications Wynnie will continue to exercise, follow nutrition and lifestyle modifications         Core Components/Risk Factors/Patient Goals at Discharge (Final Review):   Goals and Risk Factor Review - 10/05/24 1136       Core Components/Risk Factors/Patient Goals Review   Personal Goals Review Weight Management/Obesity;Hypertension;Lipids;Stress  Review Doris Davis started cardiac rehab on 09/20/24. Doris Davis did well with exercise. Vital signs were stable. Nettye has been absent due to not feeling well.    Expected Outcomes Doris Davis will continue to exercise, follow nutrition and lifestyle modifications          ITP Comments:  ITP Comments     Row Name 08/12/24 1550 08/19/24 0936 08/25/24 1337 09/07/24 1124 09/14/24 1015   ITP Comments  Completed program orientation and . Initial ITP created and sent for review to Medical Director. First full day of exercise!  Patient was oriented to gym and equipment including functions, settings, policies, and procedures.  Patient's individual exercise prescription and treatment plan were reviewed.  All starting workloads were established based on the results of the 6 minute walk test done at initial orientation visit.  The plan for exercise progression was also introduced and progression will be customized based on patient's performance and goals. 30 Day review completed. Medical Director ITP review done, changes made as directed, and signed approval by Medical Director. New to program Program in [place] called to state patient wishes to transfer there due to that location being closer to her. She will attend the program there. She completed 5 of 36 sessions. Medical Director- Dr. Wilbert Bihari, MD. Introduction to the [facility] Education/ Intensive Cardiac Rehab Program. Reviewed initial orientation folder.    Row Name 09/22/24 0917 10/05/24 1133         ITP Comments 30 Day ITP Review. Deon started cardiac rehab on 09/20/24. Labrittany did well with exercise. 30 Day ITP Review. Alexxa started cardiac rehab on 09/20/24. Chelly did well with exercise. Kendell last day of exercise was on 09/22/24. Kenzi has been absent due to not feeling well and having a fever.         Comments: See ITP Comments    [1]  Current Outpatient Medications:    acetaminophen  (TYLENOL ) 500 MG tablet, Take 500 mg by mouth daily as needed for moderate pain (pain score 4-6), mild pain (pain score 1-3) or headache., Disp: , Rfl:    ALPRAZolam  (XANAX ) 0.25 MG tablet, Take 0.25 mg by mouth daily as needed for anxiety., Disp: , Rfl:    aspirin  EC 81 MG tablet, Take 81 mg by mouth daily. Swallow whole., Disp: , Rfl:    buPROPion  (WELLBUTRIN  XL) 300 MG 24 hr tablet, Take 300 mg by mouth daily., Disp: , Rfl: 0   clopidogrel   (PLAVIX ) 75 MG tablet, Take 1 tablet (75 mg total) by mouth daily., Disp: 90 tablet, Rfl: 3   levothyroxine  (SYNTHROID ) 112 MCG tablet, Take 112 mcg by mouth daily before breakfast., Disp: , Rfl:    liothyronine  (CYTOMEL ) 5 MCG tablet, Take 5 mcg by mouth daily., Disp: , Rfl:    losartan -hydrochlorothiazide  (HYZAAR) 100-25 MG tablet, Take 1 tablet by mouth daily., Disp: , Rfl:    metoprolol  succinate (TOPROL  XL) 25 MG 24 hr tablet, Take 1 tablet (25 mg total) by mouth daily., Disp: 90 tablet, Rfl: 3   montelukast (SINGULAIR) 10 MG tablet, Take 10 mg by mouth daily as needed (allergies)., Disp: , Rfl:    Multiple Vitamin (MULTIVITAMIN) capsule, Take 1 capsule by mouth daily. One a day 50+, Disp: , Rfl:    nitroGLYCERIN  (NITROSTAT ) 0.4 MG SL tablet, Place 1 tablet (0.4 mg total) under the tongue every 5 (five) minutes as needed for chest pain., Disp: 25 tablet, Rfl: 2   rosuvastatin (CRESTOR) 20 MG tablet, Take 20 mg by mouth daily., Disp: ,  Rfl:  [2]  Social History Tobacco Use  Smoking Status Former   Current packs/day: 0.00   Average packs/day: 0.3 packs/day for 20.0 years (5.0 ttl pk-yrs)   Types: Cigarettes   Start date: 12/25/1993   Quit date: 12/25/2013   Years since quitting: 10.7  Smokeless Tobacco Never

## 2024-10-06 ENCOUNTER — Encounter: Payer: Self-pay | Admitting: Cardiology

## 2024-10-06 ENCOUNTER — Ambulatory Visit: Attending: Internal Medicine | Admitting: Cardiology

## 2024-10-06 ENCOUNTER — Encounter (HOSPITAL_COMMUNITY)

## 2024-10-06 VITALS — BP 120/72 | HR 66 | Ht 64.0 in | Wt 183.0 lb

## 2024-10-06 DIAGNOSIS — E785 Hyperlipidemia, unspecified: Secondary | ICD-10-CM

## 2024-10-06 DIAGNOSIS — I2510 Atherosclerotic heart disease of native coronary artery without angina pectoris: Secondary | ICD-10-CM

## 2024-10-06 DIAGNOSIS — Z9861 Coronary angioplasty status: Secondary | ICD-10-CM | POA: Diagnosis not present

## 2024-10-06 DIAGNOSIS — Z87891 Personal history of nicotine dependence: Secondary | ICD-10-CM | POA: Diagnosis not present

## 2024-10-06 DIAGNOSIS — I10 Essential (primary) hypertension: Secondary | ICD-10-CM

## 2024-10-06 NOTE — Progress Notes (Signed)
 " Cardiology Office Note:  .   Date:  10/10/2024  ID:  Doris Davis, DOB 1971/01/11, MRN [id number] PCP: Leonel Cole, MD  [facility] Providers Cardiologist:  Alm Clay, MD     Chief Complaint  Patient presents with   Follow-up    28-month follow-up.   Coronary Artery Disease    PCI to RCA November 2025.  No further angina    Patient Profile: .     Doris Davis is a mildly obese 54 y.o. female former smoker with a PMH notable for HTN, HLD with new diagnosis of CAD who presents here for 74-month follow-up at the request of Leonel Cole, MD.  [facility] CAD (November 2025)-RCA PCI HTN HLD Hypothyroid     Subjective  Discussed the use of AI scribe software for clinical note transcription with the patient, who gave verbal consent to proceed.  History of Present Illness Doris Davis is a 54 year old female with coronary artery disease who presents for follow-up after stent placement. She initially seen In Cardiology Consult by Scot Ford, PA for evaluation of precordial pain and murmur.  She was evaluated with a Coronary CTA that led to subsequent heart catheterization and PCI RCA.   She initially experienced shortness of breath and exhaustion, prompting the coronary CTA and heart catheterization. No chest pain or pressure was noted at that time. Post-procedure, she notes improvement in her symptoms, stating she is 'still tired, but not the level of tired I was.'  She underwent cardiac rehabilitation and is planning to continue her rehabilitation at a local [facility] due to the high cost of her current program. Her primary care physician kept her out of work for six weeks due to exhaustion, which helped her establish a routine and manage her recovery.  She was last seen on July 28, 2024 by Scot Ford, [place] as a post cath-PCI follow-up.  She returned to the ER 2 days post PCI with sudden onset chest pain that was reproducible on exam.  Troponin levels actually showed a downtrend (likely  residual from her PCI).  The episode of chest pain actually lasted several days before completely resolved and it was not necessarily associated with exertion.  At that time she requested a referral to nutrition services.  Her blood pressures were a little low so he stopped Toprol  and amlodipine  (however she actually has continued on metoprolol  25 mg but did stop the amlodipine .  Cardiovascular ROS: no chest pain or dyspnea on exertion negative for - edema, irregular heartbeat, loss of consciousness, palpitations, paroxysmal nocturnal dyspnea, rapid heart rate, shortness of breath, or other than the 1 episode of chest pain lasting few days noted above and still having some underlying fatigue, notable symptoms.  She denies any melena, hematochezia, hematuria or epistaxis.  No TIA or amaurosis fugax symptoms.  No claudication..    Objective   She is currently on Aspirin  81 mg, Plavix  75 mg, losartan  HCTZ 100/25 mg, metoprolol  25 mg, rosuvastatin 20 mg, and levothyroxine  112 mcg with liothyronine  5 mcg.. She also has tramadol  for back spasms and doxycycline for a URI.  Her family history is significant for her mother having a heart attack at age 52.  Socially, she is active and enjoys hiking, having completed a 15-mile hike shortly before her procedure. She wants to return to hiking and other outdoor activities. She has quit smoking for four to five years.  Studies Reviewed: .        SABRANo results  found for: CHOL, HDL, LDLCALC, LDLDIRECT, TRIG, CHOLHDL  Results Radiology Coronary CTA (07/12/2024): CAC 148-167.  Proximal to distal LAD mild scattered mixed calcified/noncalcified plaque <25%.  Minimal plaque in the proximal LCx <25%, high OM relatively normal, OM 2 long segment of ~25-49% noncalcified).  Mid R SCA severe 70-99% noncalcified plaque with CT FFR 0.74. CATH (07/20/2024):   Prox RCA lesion is 30% stenosed. =>  CULPRIT all Lesion: Mid RCA to Dist RCA lesion is 75% stenosed.   Dist  RCA lesion is 30% stenosed. Successful PTCA with ScoreFlex and high-pressure Apex balloon followed by DES PCI (Synergy XD 4.0 mm x 20 mm deployed to 4.3 mm).   LV end diastolic pressure is normal. There is no aortic valve stenosis.  -LV gram not performed due to significant ectopy. Diagnostic  Dominance: Right                                                   Intervention  Diagnostic Echocardiogram (07/2024): Hyperdynamic LV with EF 70-75%, mild asymmetric LVH with basal septal segment.  Otherwise normal. Ambulatory Cardiac Monitor: Minimal abnormalities, no significant arrhythmia detected. Device stopped functioning prior to hiking event.  Risk Assessment/Calculations:          Physical Exam:   VS:  BP 120/72 (BP Location: Left Arm, Patient Position: Sitting, Cuff Size: Normal)   Pulse 66   Ht 5' 4 (1.626 m)   Wt 183 lb (83 kg)   LMP 07/05/2011   SpO2 94%   BMI 31.41 kg/m    Wt Readings from Last 3 Encounters:  10/06/24 183 lb (83 kg)  09/14/24 186 lb 4.6 oz (84.5 kg)  08/12/24 186 lb 9.6 oz (84.6 kg)    GEN: Well nourished, well groomed; in no acute distress; mildly obese but otherwise healthy. NECK: No JVD; No carotid bruits CARDIAC: Normal S1, S2; RRR, cannot exclude soft 1/6 SEM at RUSB but otherwise no additional murmurs, rubs, gallops RESPIRATORY:  Clear to auscultation without rales, wheezing or rhonchi ; nonlabored, good air movement. ABDOMEN: Soft, non-tender, non-distended EXTREMITIES:  No edema; No deformity     ASSESSMENT AND PLAN: .   CAD S/P percutaneous coronary angioplasty Post-procedure, symptoms of dyspnea and fatigue improved.  1 single episode of chest pain which seem to be more musculoskeletal or potentially spasm related to days post PCI. Echo with hyperdynamic LVEF at 70-75%.   No recurrent chest pain or pressure. Heart rate well-controlled with metoprolol . Aspirin  and Plavix  continued for stent maintenance. Continue DAPT (aspirin  81 mg and Plavix  75  mg) until Jan 18, 2024, then discontinue aspirin  and continue interruptible Plavix  monotherapy to complete 1 year. Okay to stop Plavix  for 5-7 days preop for surgeries or procedures as of Jan 18, 2024 Continue with losartan -HCTZ 100-25 mg daily and Toprol -XL 25 mg daily Continue rosuvastatin 20 mg daily pending labs checked by PCP. - Continue cardiac rehabilitation as tolerated => if not able to continue due to difficulty getting to the appointment due to cost, just continue the exercises were initiated. - Encouraged regular exercise to maintain cardiovascular health. - Schedule follow-up in May 2025 to reassess stent status and overall cardiac health.  Primary hypertension Well-controlled with losartan  HCTZ 100-25 mg daily and metoprolol  25 mm.  Blood pressure management essential to reduce cardiovascular risk. - Continue losartan  HCTZ and metoprolol  for blood  pressure control. - Monitor blood pressure regularly to ensure it remains within target range.  Hyperlipidemia with target LDL less than 70 Management crucial to prevent further coronary artery disease progression.  Goal to maintain LDL cholesterol below 70 mg/dL, ideally below 55 mg/dL, to reduce cardiovascular risk. - Continue rosuvastatin 20 mg pending assessment labs. - Monitor cholesterol levels regularly to ensure LDL remains below 70 mg/dL, ideally below 55 mg/dL.  Former smoker History of tobacco use, now quit for 4-5 years. Smoking cessation significantly improved cardiovascular health and reduced risk of coronary artery disease progression. - Continue to abstain from tobacco use.   No orders of the defined types were placed in this encounter.   No orders of the defined types were placed in this encounter.         Follow-Up: Return in about 4 months (around 02/03/2025) for Alternate 4 months with APP, 8 month with MD.  I spent 50 minutes in the care of Doris Davis today including reviewing labs (2 minutes),  reviewing outside labs from [facility] (including 2 minutes), reviewing studies (I personally reviewed Films and compared the 2 echocardiograms done along with Coronary CTA and event monitor-9 minutes), face to face time discussing treatment options (28 minutes), reviewing records from notes from Hao Meng, [place] (3 minutes), 8 minutes dictating, and documenting in the encounter.   Portions of this note were dictated using DRAGON voice recognition software. Please disregard any errors in transcription. This record has been created using Conservation officer, historic buildings. Errors have been sought and corrected, but may not always be located. Such creation errors do not reflect on the standard of medical care.    Signed, Alm MICAEL Clay, MD, MS Alm Clay, M.D., M.S. Interventional Cardiologist  [facility] Pager # [phone]      "

## 2024-10-06 NOTE — Patient Instructions (Signed)
 Medication Instructions:  No changes   *If you need a refill on your cardiac medications before your next appointment, please call your pharmacy*   Lab Work: Not needed .   Testing/Procedures: Not needed   Follow-Up: At [facility], you and your health needs are our priority.  As part of our continuing mission to provide you with exceptional heart care, we have created designated Provider Care Teams.  These Care Teams include your primary Cardiologist (physician) and Advanced Practice Providers (APPs -  Physician Assistants and Nurse Practitioners) who all work together to provide you with the care you need, when you need it.     Your next appointment:   4 month(s)  The format for your next appointment:   In Person  Provider:   Scot Ford, PA-C      Then, Alm Clay, MD will plan to see you again in 11 month(s). ( Nov 2026

## 2024-10-07 ENCOUNTER — Encounter

## 2024-10-10 ENCOUNTER — Encounter: Payer: Self-pay | Admitting: Cardiology

## 2024-10-10 NOTE — Assessment & Plan Note (Signed)
 Post-procedure, symptoms of dyspnea and fatigue improved.  1 single episode of chest pain which seem to be more musculoskeletal or potentially spasm related to days post PCI. Echo with hyperdynamic LVEF at 70-75%.   No recurrent chest pain or pressure. Heart rate well-controlled with metoprolol . Aspirin  and Plavix  continued for stent maintenance. Continue DAPT (aspirin  81 mg and Plavix  75 mg) until Jan 18, 2024, then discontinue aspirin  and continue interruptible Plavix  monotherapy to complete 1 year. Okay to stop Plavix  for 5-7 days preop for surgeries or procedures as of Jan 18, 2024 Continue with losartan -HCTZ 100-25 mg daily and Toprol -XL 25 mg daily Continue rosuvastatin 20 mg daily pending labs checked by PCP. - Continue cardiac rehabilitation as tolerated => if not able to continue due to difficulty getting to the appointment due to cost, just continue the exercises were initiated. - Encouraged regular exercise to maintain cardiovascular health. - Schedule follow-up in May 2025 to reassess stent status and overall cardiac health.

## 2024-10-10 NOTE — Assessment & Plan Note (Signed)
 History of tobacco use, now quit for 4-5 years. Smoking cessation significantly improved cardiovascular health and reduced risk of coronary artery disease progression. - Continue to abstain from tobacco use.

## 2024-10-10 NOTE — Assessment & Plan Note (Signed)
 Well-controlled with losartan  HCTZ 100-25 mg daily and metoprolol  25 mm.  Blood pressure management essential to reduce cardiovascular risk. - Continue losartan  HCTZ and metoprolol  for blood pressure control. - Monitor blood pressure regularly to ensure it remains within target range.

## 2024-10-10 NOTE — Assessment & Plan Note (Signed)
 Management crucial to prevent further coronary artery disease progression.  Goal to maintain LDL cholesterol below 70 mg/dL, ideally below 55 mg/dL, to reduce cardiovascular risk. - Continue rosuvastatin 20 mg pending assessment labs. - Monitor cholesterol levels regularly to ensure LDL remains below 70 mg/dL, ideally below 55 mg/dL.

## 2024-10-11 ENCOUNTER — Encounter (HOSPITAL_COMMUNITY)

## 2024-10-12 ENCOUNTER — Encounter

## 2024-10-13 ENCOUNTER — Encounter (HOSPITAL_COMMUNITY)

## 2024-10-14 ENCOUNTER — Encounter

## 2024-10-18 ENCOUNTER — Encounter (HOSPITAL_COMMUNITY)

## 2024-10-19 ENCOUNTER — Encounter

## 2024-10-20 ENCOUNTER — Encounter (HOSPITAL_COMMUNITY)

## 2024-10-21 ENCOUNTER — Encounter

## 2024-10-25 ENCOUNTER — Encounter (HOSPITAL_COMMUNITY)

## 2024-10-26 ENCOUNTER — Encounter

## 2024-10-27 ENCOUNTER — Encounter (HOSPITAL_COMMUNITY)

## 2024-10-28 ENCOUNTER — Encounter

## 2024-11-01 ENCOUNTER — Encounter (HOSPITAL_COMMUNITY)

## 2024-11-02 ENCOUNTER — Encounter

## 2024-11-03 ENCOUNTER — Encounter (HOSPITAL_COMMUNITY)

## 2024-11-04 ENCOUNTER — Encounter

## 2024-11-09 ENCOUNTER — Encounter

## 2024-11-11 ENCOUNTER — Encounter

## 2024-11-16 ENCOUNTER — Encounter

## 2024-11-18 ENCOUNTER — Encounter

## 2024-11-23 ENCOUNTER — Encounter

## 2024-11-25 ENCOUNTER — Encounter

## 2024-11-30 ENCOUNTER — Encounter

## 2024-12-02 ENCOUNTER — Encounter

## 2024-12-07 ENCOUNTER — Encounter

## 2024-12-09 ENCOUNTER — Encounter

## 2025-02-03 ENCOUNTER — Ambulatory Visit: Admitting: Physician Assistant
